# Patient Record
Sex: Female | Born: 1935
Health system: Southern US, Community
[De-identification: ages and names within clinical notes are randomized; demographics above are authoritative.]

## PROBLEM LIST (undated history)

## (undated) DIAGNOSIS — G20 Parkinson's disease: Secondary | ICD-10-CM

## (undated) DIAGNOSIS — K279 Peptic ulcer, site unspecified, unspecified as acute or chronic, without hemorrhage or perforation: Secondary | ICD-10-CM

## (undated) DIAGNOSIS — E785 Hyperlipidemia, unspecified: Secondary | ICD-10-CM

## (undated) DIAGNOSIS — I10 Essential (primary) hypertension: Secondary | ICD-10-CM

## (undated) DIAGNOSIS — E119 Type 2 diabetes mellitus without complications: Secondary | ICD-10-CM

## (undated) DIAGNOSIS — G20A1 Parkinson's disease without dyskinesia, without mention of fluctuations: Secondary | ICD-10-CM

## (undated) DIAGNOSIS — M1990 Unspecified osteoarthritis, unspecified site: Secondary | ICD-10-CM

## (undated) HISTORY — DX: Hyperlipidemia, unspecified: E78.5

## (undated) HISTORY — DX: Parkinson's disease without dyskinesia, without mention of fluctuations: G20.A1

## (undated) HISTORY — PX: CATARACT EXTRACTION, BILATERAL: SHX1313

## (undated) HISTORY — DX: Parkinson's disease: G20

## (undated) HISTORY — DX: Peptic ulcer, site unspecified, unspecified as acute or chronic, without hemorrhage or perforation: K27.9

## (undated) HISTORY — DX: Type 2 diabetes mellitus without complications: E11.9

## (undated) HISTORY — PX: ABDOMINAL HERNIA REPAIR: SHX539

## (undated) HISTORY — DX: Unspecified osteoarthritis, unspecified site: M19.90

## (undated) HISTORY — PX: OTHER SURGICAL HISTORY: SHX169

## (undated) HISTORY — DX: Essential (primary) hypertension: I10

---
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
## 1974-08-04 HISTORY — PX: VAGINAL HYSTERECTOMY: SUR661

## 1987-08-05 HISTORY — PX: CHOLECYSTECTOMY: SHX55

## 1991-08-05 HISTORY — PX: OTHER SURGICAL HISTORY: SHX169

## 1992-08-04 HISTORY — PX: ADENOIDECTOMY: SUR15

## 2005-06-09 ENCOUNTER — Ambulatory Visit: Payer: Self-pay | Admitting: Internal Medicine

## 2005-06-10 ENCOUNTER — Ambulatory Visit: Payer: Self-pay | Admitting: Internal Medicine

## 2010-03-23 ENCOUNTER — Inpatient Hospital Stay: Payer: Self-pay | Admitting: Internal Medicine

## 2010-03-29 LAB — PATHOLOGY REPORT

## 2010-04-18 ENCOUNTER — Ambulatory Visit: Payer: Self-pay | Admitting: Gastroenterology

## 2010-06-03 ENCOUNTER — Ambulatory Visit: Payer: Self-pay | Admitting: Gastroenterology

## 2010-07-04 ENCOUNTER — Ambulatory Visit: Payer: Self-pay | Admitting: Surgery

## 2010-07-11 ENCOUNTER — Inpatient Hospital Stay: Payer: Self-pay | Admitting: Surgery

## 2011-02-24 ENCOUNTER — Ambulatory Visit: Payer: Self-pay | Admitting: Gastroenterology

## 2011-07-29 ENCOUNTER — Emergency Department: Payer: Self-pay | Admitting: Internal Medicine

## 2012-04-27 ENCOUNTER — Observation Stay: Payer: Self-pay | Admitting: Internal Medicine

## 2012-04-27 LAB — CBC WITH DIFFERENTIAL/PLATELET
Basophil #: 0.1 10*3/uL (ref 0.0–0.1)
Basophil %: 0.6 %
Eosinophil #: 0 10*3/uL (ref 0.0–0.7)
HCT: 42.2 % (ref 35.0–47.0)
Lymphocyte #: 1.1 10*3/uL (ref 1.0–3.6)
MCHC: 33.3 g/dL (ref 32.0–36.0)
MCV: 88 fL (ref 80–100)
Monocyte #: 0.6 x10 3/mm (ref 0.2–0.9)
Neutrophil #: 8.6 10*3/uL — ABNORMAL HIGH (ref 1.4–6.5)
Neutrophil %: 82.6 %
RBC: 4.79 10*6/uL (ref 3.80–5.20)
RDW: 13.1 % (ref 11.5–14.5)
WBC: 10.4 10*3/uL (ref 3.6–11.0)

## 2012-04-27 LAB — URINALYSIS, COMPLETE
Blood: NEGATIVE
Protein: NEGATIVE
Specific Gravity: 1.005 (ref 1.003–1.030)

## 2012-04-27 LAB — COMPREHENSIVE METABOLIC PANEL
Albumin: 4.1 g/dL (ref 3.4–5.0)
Alkaline Phosphatase: 128 U/L (ref 50–136)
BUN: 16 mg/dL (ref 7–18)
Bilirubin,Total: 0.3 mg/dL (ref 0.2–1.0)
Chloride: 104 mmol/L (ref 98–107)
Creatinine: 0.66 mg/dL (ref 0.60–1.30)
EGFR (Non-African Amer.): >60
Glucose: 135 mg/dL — ABNORMAL HIGH (ref 65–99)
Osmolality: 283 (ref 275–301)
SGPT (ALT): 11 U/L — ABNORMAL LOW (ref 12–78)
Total Protein: 7.8 g/dL (ref 6.4–8.2)

## 2012-04-27 LAB — CK TOTAL AND CKMB (NOT AT ARMC)
CK, Total: 57 U/L (ref 21–215)
CK, Total: 40 U/L (ref 21–215)
CK-MB: <0.5 ng/mL — ABNORMAL LOW (ref 0.5–3.6)

## 2012-04-27 LAB — TROPONIN I
Troponin-I: <0.02 ng/mL
Troponin-I: 0.03 ng/mL

## 2012-04-28 LAB — CBC WITH DIFFERENTIAL/PLATELET
Basophil #: 0.1 10*3/uL (ref 0.0–0.1)
Basophil %: 0.9 %
Eosinophil #: 0.1 10*3/uL (ref 0.0–0.7)
HCT: 37.2 % (ref 35.0–47.0)
HGB: 12.7 g/dL (ref 12.0–16.0)
Lymphocyte #: 1.7 10*3/uL (ref 1.0–3.6)
MCH: 30.5 pg (ref 26.0–34.0)
MCV: 90 fL (ref 80–100)
Monocyte #: 0.4 x10 3/mm (ref 0.2–0.9)
Monocyte %: 6.1 %
Neutrophil #: 5 10*3/uL (ref 1.4–6.5)
Platelet: 230 10*3/uL (ref 150–440)
RBC: 4.15 10*6/uL (ref 3.80–5.20)
RDW: 13.2 % (ref 11.5–14.5)
WBC: 7.3 10*3/uL (ref 3.6–11.0)

## 2012-04-28 LAB — CK TOTAL AND CKMB (NOT AT ARMC)
CK, Total: 45 U/L (ref 21–215)
CK, Total: 45 U/L (ref 21–215)
CK-MB: 0.7 ng/mL (ref 0.5–3.6)
CK-MB: 0.8 ng/mL (ref 0.5–3.6)

## 2012-04-28 LAB — BASIC METABOLIC PANEL
Anion Gap: 13 (ref 7–16)
Calcium, Total: 8.5 mg/dL (ref 8.5–10.1)
Chloride: 107 mmol/L (ref 98–107)
Co2: 21 mmol/L (ref 21–32)
Creatinine: 0.58 mg/dL — ABNORMAL LOW (ref 0.60–1.30)
EGFR (African American): >60
EGFR (Non-African Amer.): >60

## 2012-04-28 LAB — TROPONIN I: Troponin-I: <0.02 ng/mL

## 2012-12-03 ENCOUNTER — Emergency Department: Payer: Self-pay | Admitting: Emergency Medicine

## 2012-12-03 LAB — CBC
HCT: 42.8 % (ref 35.0–47.0)
HGB: 14.6 g/dL (ref 12.0–16.0)
MCH: 29.5 pg (ref 26.0–34.0)
MCHC: 34.1 g/dL (ref 32.0–36.0)
Platelet: 240 10*3/uL (ref 150–440)
RBC: 4.94 10*6/uL (ref 3.80–5.20)
WBC: 8 10*3/uL (ref 3.6–11.0)

## 2012-12-03 LAB — COMPREHENSIVE METABOLIC PANEL
Alkaline Phosphatase: 140 U/L — ABNORMAL HIGH (ref 50–136)
Anion Gap: 6 — ABNORMAL LOW (ref 7–16)
Chloride: 103 mmol/L (ref 98–107)
EGFR (African American): >60
EGFR (Non-African Amer.): >60
SGPT (ALT): 9 U/L — ABNORMAL LOW (ref 12–78)
Sodium: 136 mmol/L (ref 136–145)

## 2012-12-08 ENCOUNTER — Emergency Department: Payer: Self-pay

## 2012-12-08 LAB — COMPREHENSIVE METABOLIC PANEL
Alkaline Phosphatase: 132 U/L (ref 50–136)
BUN: 20 mg/dL — ABNORMAL HIGH (ref 7–18)
Bilirubin,Total: 0.3 mg/dL (ref 0.2–1.0)
Calcium, Total: 9.2 mg/dL (ref 8.5–10.1)
Creatinine: 0.42 mg/dL — ABNORMAL LOW (ref 0.60–1.30)
EGFR (African American): >60
Glucose: 106 mg/dL — ABNORMAL HIGH (ref 65–99)
Potassium: 4.6 mmol/L (ref 3.5–5.1)
SGOT(AST): 20 U/L (ref 15–37)

## 2012-12-08 LAB — URINALYSIS, COMPLETE
Blood: NEGATIVE
Ketone: NEGATIVE
Ph: 7 (ref 4.5–8.0)
Protein: NEGATIVE
Specific Gravity: 1.003 (ref 1.003–1.030)
Squamous Epithelial: <1

## 2012-12-08 LAB — CK TOTAL AND CKMB (NOT AT ARMC): CK-MB: <0.5 ng/mL — ABNORMAL LOW (ref 0.5–3.6)

## 2012-12-08 LAB — CBC
HGB: 13 g/dL (ref 12.0–16.0)
MCV: 88 fL (ref 80–100)
RDW: 13 % (ref 11.5–14.5)

## 2012-12-08 LAB — TROPONIN I: Troponin-I: <0.02 ng/mL

## 2012-12-09 ENCOUNTER — Ambulatory Visit: Payer: Self-pay | Admitting: Gastroenterology

## 2013-08-08 DIAGNOSIS — D729 Disorder of white blood cells, unspecified: Secondary | ICD-10-CM | POA: Diagnosis not present

## 2013-08-08 DIAGNOSIS — D518 Other vitamin B12 deficiency anemias: Secondary | ICD-10-CM | POA: Diagnosis not present

## 2013-08-08 DIAGNOSIS — G259 Extrapyramidal and movement disorder, unspecified: Secondary | ICD-10-CM | POA: Diagnosis not present

## 2013-08-08 DIAGNOSIS — E039 Hypothyroidism, unspecified: Secondary | ICD-10-CM | POA: Diagnosis not present

## 2013-08-08 DIAGNOSIS — E559 Vitamin D deficiency, unspecified: Secondary | ICD-10-CM | POA: Diagnosis not present

## 2013-08-31 ENCOUNTER — Emergency Department: Payer: Self-pay | Admitting: Emergency Medicine

## 2013-08-31 DIAGNOSIS — Z888 Allergy status to other drugs, medicaments and biological substances status: Secondary | ICD-10-CM | POA: Diagnosis not present

## 2013-08-31 DIAGNOSIS — Z9079 Acquired absence of other genital organ(s): Secondary | ICD-10-CM | POA: Diagnosis not present

## 2013-08-31 DIAGNOSIS — G20 Parkinson's disease: Secondary | ICD-10-CM | POA: Diagnosis not present

## 2013-08-31 DIAGNOSIS — R1084 Generalized abdominal pain: Secondary | ICD-10-CM | POA: Diagnosis not present

## 2013-08-31 DIAGNOSIS — G20A1 Parkinson's disease without dyskinesia, without mention of fluctuations: Secondary | ICD-10-CM | POA: Diagnosis not present

## 2013-08-31 DIAGNOSIS — R51 Headache: Secondary | ICD-10-CM | POA: Diagnosis not present

## 2013-08-31 DIAGNOSIS — I10 Essential (primary) hypertension: Secondary | ICD-10-CM | POA: Diagnosis not present

## 2013-08-31 DIAGNOSIS — Z9089 Acquired absence of other organs: Secondary | ICD-10-CM | POA: Diagnosis not present

## 2013-08-31 DIAGNOSIS — Z79899 Other long term (current) drug therapy: Secondary | ICD-10-CM | POA: Diagnosis not present

## 2013-08-31 DIAGNOSIS — R5381 Other malaise: Secondary | ICD-10-CM | POA: Diagnosis not present

## 2013-08-31 DIAGNOSIS — R5383 Other fatigue: Secondary | ICD-10-CM | POA: Diagnosis not present

## 2013-08-31 LAB — CBC WITH DIFFERENTIAL/PLATELET
BASOS PCT: 0.5 %
Basophil #: 0 10*3/uL (ref 0.0–0.1)
EOS ABS: 0 10*3/uL (ref 0.0–0.7)
EOS PCT: 0.5 %
HCT: 41.4 % (ref 35.0–47.0)
HGB: 13.8 g/dL (ref 12.0–16.0)
LYMPHS PCT: 18.7 %
Lymphocyte #: 1.7 10*3/uL (ref 1.0–3.6)
MCH: 29.8 pg (ref 26.0–34.0)
MCHC: 33.4 g/dL (ref 32.0–36.0)
MCV: 89 fL (ref 80–100)
MONOS PCT: 6.2 %
Monocyte #: 0.6 x10 3/mm (ref 0.2–0.9)
Neutrophil #: 6.7 10*3/uL — ABNORMAL HIGH (ref 1.4–6.5)
Neutrophil %: 74.1 %
Platelet: 257 10*3/uL (ref 150–440)
RBC: 4.64 10*6/uL (ref 3.80–5.20)
RDW: 13.7 % (ref 11.5–14.5)
WBC: 9 10*3/uL (ref 3.6–11.0)

## 2013-08-31 LAB — COMPREHENSIVE METABOLIC PANEL
ALT: 11 U/L — AB (ref 12–78)
Albumin: 4.2 g/dL (ref 3.4–5.0)
Alkaline Phosphatase: 122 U/L — ABNORMAL HIGH
Anion Gap: 6 — ABNORMAL LOW (ref 7–16)
BILIRUBIN TOTAL: 0.5 mg/dL (ref 0.2–1.0)
BUN: 15 mg/dL (ref 7–18)
Calcium, Total: 9.5 mg/dL (ref 8.5–10.1)
Chloride: 102 mmol/L (ref 98–107)
Co2: 29 mmol/L (ref 21–32)
Creatinine: 0.62 mg/dL (ref 0.60–1.30)
EGFR (Non-African Amer.): >60
Glucose: 121 mg/dL — ABNORMAL HIGH (ref 65–99)
Osmolality: 276 (ref 275–301)
POTASSIUM: 3.8 mmol/L (ref 3.5–5.1)
SGOT(AST): 19 U/L (ref 15–37)
SODIUM: 137 mmol/L (ref 136–145)
TOTAL PROTEIN: 7.9 g/dL (ref 6.4–8.2)

## 2013-08-31 LAB — URINALYSIS, COMPLETE
BLOOD: NEGATIVE
Bilirubin,UR: NEGATIVE
Glucose,UR: NEGATIVE mg/dL (ref 0–75)
Ketone: NEGATIVE
NITRITE: NEGATIVE
PROTEIN: NEGATIVE
Ph: 7 (ref 4.5–8.0)
RBC,UR: <1 /HPF (ref 0–5)
Specific Gravity: 1.003 (ref 1.003–1.030)
Squamous Epithelial: 1
WBC UR: 3 /HPF (ref 0–5)

## 2013-08-31 LAB — TROPONIN I: Troponin-I: <0.02 ng/mL

## 2013-09-02 DIAGNOSIS — W19XXXA Unspecified fall, initial encounter: Secondary | ICD-10-CM | POA: Diagnosis not present

## 2013-09-02 DIAGNOSIS — G20 Parkinson's disease: Secondary | ICD-10-CM | POA: Diagnosis not present

## 2013-09-02 DIAGNOSIS — G4700 Insomnia, unspecified: Secondary | ICD-10-CM | POA: Diagnosis not present

## 2013-09-02 DIAGNOSIS — F411 Generalized anxiety disorder: Secondary | ICD-10-CM | POA: Diagnosis not present

## 2013-09-02 DIAGNOSIS — K5909 Other constipation: Secondary | ICD-10-CM | POA: Diagnosis not present

## 2013-09-23 DIAGNOSIS — G2401 Drug induced subacute dyskinesia: Secondary | ICD-10-CM | POA: Diagnosis not present

## 2013-09-23 DIAGNOSIS — R269 Unspecified abnormalities of gait and mobility: Secondary | ICD-10-CM | POA: Diagnosis not present

## 2013-09-23 DIAGNOSIS — F411 Generalized anxiety disorder: Secondary | ICD-10-CM | POA: Diagnosis not present

## 2013-09-23 DIAGNOSIS — M48061 Spinal stenosis, lumbar region without neurogenic claudication: Secondary | ICD-10-CM | POA: Diagnosis not present

## 2013-09-23 DIAGNOSIS — M25569 Pain in unspecified knee: Secondary | ICD-10-CM | POA: Diagnosis not present

## 2013-09-23 DIAGNOSIS — IMO0001 Reserved for inherently not codable concepts without codable children: Secondary | ICD-10-CM | POA: Diagnosis not present

## 2013-09-23 DIAGNOSIS — T44905A Adverse effect of unspecified drugs primarily affecting the autonomic nervous system, initial encounter: Secondary | ICD-10-CM | POA: Diagnosis not present

## 2013-09-23 DIAGNOSIS — G20 Parkinson's disease: Secondary | ICD-10-CM | POA: Diagnosis not present

## 2013-09-23 DIAGNOSIS — Z9181 History of falling: Secondary | ICD-10-CM | POA: Diagnosis not present

## 2013-09-27 DIAGNOSIS — M48061 Spinal stenosis, lumbar region without neurogenic claudication: Secondary | ICD-10-CM | POA: Diagnosis not present

## 2013-09-27 DIAGNOSIS — R269 Unspecified abnormalities of gait and mobility: Secondary | ICD-10-CM | POA: Diagnosis not present

## 2013-09-27 DIAGNOSIS — F411 Generalized anxiety disorder: Secondary | ICD-10-CM | POA: Diagnosis not present

## 2013-09-27 DIAGNOSIS — IMO0001 Reserved for inherently not codable concepts without codable children: Secondary | ICD-10-CM | POA: Diagnosis not present

## 2013-09-27 DIAGNOSIS — G20 Parkinson's disease: Secondary | ICD-10-CM | POA: Diagnosis not present

## 2013-09-27 DIAGNOSIS — M25569 Pain in unspecified knee: Secondary | ICD-10-CM | POA: Diagnosis not present

## 2013-09-28 DIAGNOSIS — F4320 Adjustment disorder, unspecified: Secondary | ICD-10-CM | POA: Diagnosis not present

## 2013-09-30 DIAGNOSIS — G20 Parkinson's disease: Secondary | ICD-10-CM | POA: Diagnosis not present

## 2013-09-30 DIAGNOSIS — IMO0001 Reserved for inherently not codable concepts without codable children: Secondary | ICD-10-CM | POA: Diagnosis not present

## 2013-09-30 DIAGNOSIS — R269 Unspecified abnormalities of gait and mobility: Secondary | ICD-10-CM | POA: Diagnosis not present

## 2013-09-30 DIAGNOSIS — M48061 Spinal stenosis, lumbar region without neurogenic claudication: Secondary | ICD-10-CM | POA: Diagnosis not present

## 2013-09-30 DIAGNOSIS — M25569 Pain in unspecified knee: Secondary | ICD-10-CM | POA: Diagnosis not present

## 2013-09-30 DIAGNOSIS — F411 Generalized anxiety disorder: Secondary | ICD-10-CM | POA: Diagnosis not present

## 2013-10-03 DIAGNOSIS — G20 Parkinson's disease: Secondary | ICD-10-CM | POA: Diagnosis not present

## 2013-10-03 DIAGNOSIS — R269 Unspecified abnormalities of gait and mobility: Secondary | ICD-10-CM | POA: Diagnosis not present

## 2013-10-03 DIAGNOSIS — M25569 Pain in unspecified knee: Secondary | ICD-10-CM | POA: Diagnosis not present

## 2013-10-03 DIAGNOSIS — IMO0001 Reserved for inherently not codable concepts without codable children: Secondary | ICD-10-CM | POA: Diagnosis not present

## 2013-10-03 DIAGNOSIS — F411 Generalized anxiety disorder: Secondary | ICD-10-CM | POA: Diagnosis not present

## 2013-10-03 DIAGNOSIS — M48061 Spinal stenosis, lumbar region without neurogenic claudication: Secondary | ICD-10-CM | POA: Diagnosis not present

## 2013-10-04 DIAGNOSIS — G20 Parkinson's disease: Secondary | ICD-10-CM | POA: Diagnosis not present

## 2013-10-04 DIAGNOSIS — M48061 Spinal stenosis, lumbar region without neurogenic claudication: Secondary | ICD-10-CM | POA: Diagnosis not present

## 2013-10-04 DIAGNOSIS — F411 Generalized anxiety disorder: Secondary | ICD-10-CM | POA: Diagnosis not present

## 2013-10-04 DIAGNOSIS — G2401 Drug induced subacute dyskinesia: Secondary | ICD-10-CM | POA: Diagnosis not present

## 2013-10-04 DIAGNOSIS — R269 Unspecified abnormalities of gait and mobility: Secondary | ICD-10-CM | POA: Diagnosis not present

## 2013-10-04 DIAGNOSIS — M25569 Pain in unspecified knee: Secondary | ICD-10-CM | POA: Diagnosis not present

## 2013-10-08 DIAGNOSIS — G20 Parkinson's disease: Secondary | ICD-10-CM | POA: Diagnosis not present

## 2013-10-08 DIAGNOSIS — M48061 Spinal stenosis, lumbar region without neurogenic claudication: Secondary | ICD-10-CM | POA: Diagnosis not present

## 2013-10-08 DIAGNOSIS — F411 Generalized anxiety disorder: Secondary | ICD-10-CM | POA: Diagnosis not present

## 2013-10-08 DIAGNOSIS — M25569 Pain in unspecified knee: Secondary | ICD-10-CM | POA: Diagnosis not present

## 2013-10-08 DIAGNOSIS — R269 Unspecified abnormalities of gait and mobility: Secondary | ICD-10-CM | POA: Diagnosis not present

## 2013-10-08 DIAGNOSIS — IMO0001 Reserved for inherently not codable concepts without codable children: Secondary | ICD-10-CM | POA: Diagnosis not present

## 2013-10-11 DIAGNOSIS — G20 Parkinson's disease: Secondary | ICD-10-CM | POA: Diagnosis not present

## 2013-10-11 DIAGNOSIS — R269 Unspecified abnormalities of gait and mobility: Secondary | ICD-10-CM | POA: Diagnosis not present

## 2013-10-11 DIAGNOSIS — M48061 Spinal stenosis, lumbar region without neurogenic claudication: Secondary | ICD-10-CM | POA: Diagnosis not present

## 2013-10-11 DIAGNOSIS — F411 Generalized anxiety disorder: Secondary | ICD-10-CM | POA: Diagnosis not present

## 2013-10-11 DIAGNOSIS — IMO0001 Reserved for inherently not codable concepts without codable children: Secondary | ICD-10-CM | POA: Diagnosis not present

## 2013-10-11 DIAGNOSIS — M25569 Pain in unspecified knee: Secondary | ICD-10-CM | POA: Diagnosis not present

## 2013-10-13 DIAGNOSIS — M48061 Spinal stenosis, lumbar region without neurogenic claudication: Secondary | ICD-10-CM | POA: Diagnosis not present

## 2013-10-13 DIAGNOSIS — IMO0001 Reserved for inherently not codable concepts without codable children: Secondary | ICD-10-CM | POA: Diagnosis not present

## 2013-10-13 DIAGNOSIS — R269 Unspecified abnormalities of gait and mobility: Secondary | ICD-10-CM | POA: Diagnosis not present

## 2013-10-13 DIAGNOSIS — F411 Generalized anxiety disorder: Secondary | ICD-10-CM | POA: Diagnosis not present

## 2013-10-13 DIAGNOSIS — G20 Parkinson's disease: Secondary | ICD-10-CM | POA: Diagnosis not present

## 2013-10-13 DIAGNOSIS — M25569 Pain in unspecified knee: Secondary | ICD-10-CM | POA: Diagnosis not present

## 2013-10-18 DIAGNOSIS — IMO0001 Reserved for inherently not codable concepts without codable children: Secondary | ICD-10-CM | POA: Diagnosis not present

## 2013-10-18 DIAGNOSIS — G20 Parkinson's disease: Secondary | ICD-10-CM | POA: Diagnosis not present

## 2013-10-18 DIAGNOSIS — F411 Generalized anxiety disorder: Secondary | ICD-10-CM | POA: Diagnosis not present

## 2013-10-18 DIAGNOSIS — R269 Unspecified abnormalities of gait and mobility: Secondary | ICD-10-CM | POA: Diagnosis not present

## 2013-10-18 DIAGNOSIS — M25569 Pain in unspecified knee: Secondary | ICD-10-CM | POA: Diagnosis not present

## 2013-10-18 DIAGNOSIS — M48061 Spinal stenosis, lumbar region without neurogenic claudication: Secondary | ICD-10-CM | POA: Diagnosis not present

## 2013-10-20 DIAGNOSIS — M25569 Pain in unspecified knee: Secondary | ICD-10-CM | POA: Diagnosis not present

## 2013-10-20 DIAGNOSIS — G20 Parkinson's disease: Secondary | ICD-10-CM | POA: Diagnosis not present

## 2013-10-20 DIAGNOSIS — IMO0001 Reserved for inherently not codable concepts without codable children: Secondary | ICD-10-CM | POA: Diagnosis not present

## 2013-10-20 DIAGNOSIS — R269 Unspecified abnormalities of gait and mobility: Secondary | ICD-10-CM | POA: Diagnosis not present

## 2013-10-20 DIAGNOSIS — F411 Generalized anxiety disorder: Secondary | ICD-10-CM | POA: Diagnosis not present

## 2013-10-20 DIAGNOSIS — M48061 Spinal stenosis, lumbar region without neurogenic claudication: Secondary | ICD-10-CM | POA: Diagnosis not present

## 2013-11-01 DIAGNOSIS — I10 Essential (primary) hypertension: Secondary | ICD-10-CM | POA: Diagnosis not present

## 2013-11-01 DIAGNOSIS — M6281 Muscle weakness (generalized): Secondary | ICD-10-CM | POA: Diagnosis not present

## 2013-11-01 DIAGNOSIS — D518 Other vitamin B12 deficiency anemias: Secondary | ICD-10-CM | POA: Diagnosis not present

## 2013-11-01 DIAGNOSIS — K219 Gastro-esophageal reflux disease without esophagitis: Secondary | ICD-10-CM | POA: Diagnosis not present

## 2013-11-01 DIAGNOSIS — M810 Age-related osteoporosis without current pathological fracture: Secondary | ICD-10-CM | POA: Diagnosis not present

## 2013-11-01 DIAGNOSIS — G259 Extrapyramidal and movement disorder, unspecified: Secondary | ICD-10-CM | POA: Diagnosis not present

## 2013-11-01 DIAGNOSIS — H9190 Unspecified hearing loss, unspecified ear: Secondary | ICD-10-CM | POA: Diagnosis not present

## 2013-11-01 DIAGNOSIS — D649 Anemia, unspecified: Secondary | ICD-10-CM | POA: Diagnosis not present

## 2013-11-03 DIAGNOSIS — F411 Generalized anxiety disorder: Secondary | ICD-10-CM | POA: Diagnosis not present

## 2013-12-01 DIAGNOSIS — F411 Generalized anxiety disorder: Secondary | ICD-10-CM | POA: Diagnosis not present

## 2013-12-01 DIAGNOSIS — K5909 Other constipation: Secondary | ICD-10-CM | POA: Diagnosis not present

## 2013-12-01 DIAGNOSIS — G4700 Insomnia, unspecified: Secondary | ICD-10-CM | POA: Diagnosis not present

## 2013-12-01 DIAGNOSIS — G20 Parkinson's disease: Secondary | ICD-10-CM | POA: Diagnosis not present

## 2013-12-01 DIAGNOSIS — W19XXXA Unspecified fall, initial encounter: Secondary | ICD-10-CM | POA: Diagnosis not present

## 2014-02-17 DIAGNOSIS — W19XXXA Unspecified fall, initial encounter: Secondary | ICD-10-CM | POA: Diagnosis not present

## 2014-02-17 DIAGNOSIS — G20 Parkinson's disease: Secondary | ICD-10-CM | POA: Diagnosis not present

## 2014-02-17 DIAGNOSIS — G4700 Insomnia, unspecified: Secondary | ICD-10-CM | POA: Diagnosis not present

## 2014-02-17 DIAGNOSIS — F411 Generalized anxiety disorder: Secondary | ICD-10-CM | POA: Diagnosis not present

## 2014-03-01 DIAGNOSIS — M25569 Pain in unspecified knee: Secondary | ICD-10-CM | POA: Diagnosis not present

## 2014-03-01 DIAGNOSIS — E559 Vitamin D deficiency, unspecified: Secondary | ICD-10-CM | POA: Diagnosis not present

## 2014-03-01 DIAGNOSIS — G238 Other specified degenerative diseases of basal ganglia: Secondary | ICD-10-CM | POA: Diagnosis not present

## 2014-03-01 DIAGNOSIS — D518 Other vitamin B12 deficiency anemias: Secondary | ICD-10-CM | POA: Diagnosis not present

## 2014-03-01 DIAGNOSIS — R5381 Other malaise: Secondary | ICD-10-CM | POA: Diagnosis not present

## 2014-03-01 DIAGNOSIS — R5383 Other fatigue: Secondary | ICD-10-CM | POA: Diagnosis not present

## 2014-03-01 DIAGNOSIS — I10 Essential (primary) hypertension: Secondary | ICD-10-CM | POA: Diagnosis not present

## 2014-03-01 DIAGNOSIS — D649 Anemia, unspecified: Secondary | ICD-10-CM | POA: Diagnosis not present

## 2014-03-22 DIAGNOSIS — E2839 Other primary ovarian failure: Secondary | ICD-10-CM | POA: Diagnosis not present

## 2014-03-22 DIAGNOSIS — M899 Disorder of bone, unspecified: Secondary | ICD-10-CM | POA: Diagnosis not present

## 2014-03-22 DIAGNOSIS — Z13820 Encounter for screening for osteoporosis: Secondary | ICD-10-CM | POA: Diagnosis not present

## 2014-03-22 DIAGNOSIS — M810 Age-related osteoporosis without current pathological fracture: Secondary | ICD-10-CM | POA: Diagnosis not present

## 2014-03-22 DIAGNOSIS — M949 Disorder of cartilage, unspecified: Secondary | ICD-10-CM | POA: Diagnosis not present

## 2014-03-31 ENCOUNTER — Emergency Department: Payer: Self-pay | Admitting: Internal Medicine

## 2014-03-31 DIAGNOSIS — S0003XA Contusion of scalp, initial encounter: Secondary | ICD-10-CM | POA: Diagnosis not present

## 2014-03-31 DIAGNOSIS — S0083XA Contusion of other part of head, initial encounter: Secondary | ICD-10-CM | POA: Diagnosis not present

## 2014-03-31 DIAGNOSIS — S79919A Unspecified injury of unspecified hip, initial encounter: Secondary | ICD-10-CM | POA: Diagnosis not present

## 2014-03-31 DIAGNOSIS — M25559 Pain in unspecified hip: Secondary | ICD-10-CM | POA: Diagnosis not present

## 2014-03-31 DIAGNOSIS — G20 Parkinson's disease: Secondary | ICD-10-CM | POA: Diagnosis not present

## 2014-03-31 DIAGNOSIS — S7000XA Contusion of unspecified hip, initial encounter: Secondary | ICD-10-CM | POA: Diagnosis not present

## 2014-03-31 DIAGNOSIS — I10 Essential (primary) hypertension: Secondary | ICD-10-CM | POA: Diagnosis not present

## 2014-03-31 DIAGNOSIS — S79929A Unspecified injury of unspecified thigh, initial encounter: Secondary | ICD-10-CM | POA: Diagnosis not present

## 2014-05-12 DIAGNOSIS — M810 Age-related osteoporosis without current pathological fracture: Secondary | ICD-10-CM | POA: Diagnosis not present

## 2014-05-18 DIAGNOSIS — H2512 Age-related nuclear cataract, left eye: Secondary | ICD-10-CM | POA: Diagnosis not present

## 2014-05-22 DIAGNOSIS — G20 Parkinson's disease: Secondary | ICD-10-CM | POA: Diagnosis not present

## 2014-05-22 DIAGNOSIS — F419 Anxiety disorder, unspecified: Secondary | ICD-10-CM | POA: Diagnosis not present

## 2014-05-22 DIAGNOSIS — K5909 Other constipation: Secondary | ICD-10-CM | POA: Diagnosis not present

## 2014-05-22 DIAGNOSIS — W19XXXA Unspecified fall, initial encounter: Secondary | ICD-10-CM | POA: Diagnosis not present

## 2014-06-05 DIAGNOSIS — E559 Vitamin D deficiency, unspecified: Secondary | ICD-10-CM | POA: Diagnosis not present

## 2014-06-05 DIAGNOSIS — G259 Extrapyramidal and movement disorder, unspecified: Secondary | ICD-10-CM | POA: Diagnosis not present

## 2014-06-05 DIAGNOSIS — M810 Age-related osteoporosis without current pathological fracture: Secondary | ICD-10-CM | POA: Diagnosis not present

## 2014-06-05 DIAGNOSIS — I10 Essential (primary) hypertension: Secondary | ICD-10-CM | POA: Diagnosis not present

## 2014-06-05 DIAGNOSIS — D518 Other vitamin B12 deficiency anemias: Secondary | ICD-10-CM | POA: Diagnosis not present

## 2014-06-05 DIAGNOSIS — G20 Parkinson's disease: Secondary | ICD-10-CM | POA: Diagnosis not present

## 2014-06-05 DIAGNOSIS — Z8711 Personal history of peptic ulcer disease: Secondary | ICD-10-CM | POA: Diagnosis not present

## 2014-06-06 DIAGNOSIS — H905 Unspecified sensorineural hearing loss: Secondary | ICD-10-CM | POA: Diagnosis not present

## 2014-06-06 DIAGNOSIS — H6123 Impacted cerumen, bilateral: Secondary | ICD-10-CM | POA: Diagnosis not present

## 2014-06-06 DIAGNOSIS — G20 Parkinson's disease: Secondary | ICD-10-CM | POA: Diagnosis not present

## 2014-06-12 DIAGNOSIS — M810 Age-related osteoporosis without current pathological fracture: Secondary | ICD-10-CM | POA: Diagnosis not present

## 2014-07-05 ENCOUNTER — Ambulatory Visit: Payer: Self-pay | Admitting: Ophthalmology

## 2014-07-05 DIAGNOSIS — Z01810 Encounter for preprocedural cardiovascular examination: Secondary | ICD-10-CM | POA: Diagnosis not present

## 2014-07-05 DIAGNOSIS — H2512 Age-related nuclear cataract, left eye: Secondary | ICD-10-CM | POA: Diagnosis not present

## 2014-07-05 DIAGNOSIS — H269 Unspecified cataract: Secondary | ICD-10-CM | POA: Diagnosis not present

## 2014-07-05 DIAGNOSIS — Z01812 Encounter for preprocedural laboratory examination: Secondary | ICD-10-CM | POA: Diagnosis not present

## 2014-07-05 DIAGNOSIS — I10 Essential (primary) hypertension: Secondary | ICD-10-CM | POA: Diagnosis not present

## 2014-07-05 LAB — HEMOGLOBIN: HGB: 12.8 g/dL (ref 12.0–16.0)

## 2014-07-18 ENCOUNTER — Ambulatory Visit: Payer: Self-pay | Admitting: Ophthalmology

## 2014-07-18 DIAGNOSIS — K219 Gastro-esophageal reflux disease without esophagitis: Secondary | ICD-10-CM | POA: Diagnosis not present

## 2014-07-18 DIAGNOSIS — Z888 Allergy status to other drugs, medicaments and biological substances status: Secondary | ICD-10-CM | POA: Diagnosis not present

## 2014-07-18 DIAGNOSIS — K449 Diaphragmatic hernia without obstruction or gangrene: Secondary | ICD-10-CM | POA: Diagnosis not present

## 2014-07-18 DIAGNOSIS — Z79899 Other long term (current) drug therapy: Secondary | ICD-10-CM | POA: Diagnosis not present

## 2014-07-18 DIAGNOSIS — Z885 Allergy status to narcotic agent status: Secondary | ICD-10-CM | POA: Diagnosis not present

## 2014-07-18 DIAGNOSIS — I10 Essential (primary) hypertension: Secondary | ICD-10-CM | POA: Diagnosis not present

## 2014-07-18 DIAGNOSIS — D649 Anemia, unspecified: Secondary | ICD-10-CM | POA: Diagnosis not present

## 2014-07-18 DIAGNOSIS — H2512 Age-related nuclear cataract, left eye: Secondary | ICD-10-CM | POA: Diagnosis not present

## 2014-07-18 DIAGNOSIS — H9190 Unspecified hearing loss, unspecified ear: Secondary | ICD-10-CM | POA: Diagnosis not present

## 2014-07-18 DIAGNOSIS — M1990 Unspecified osteoarthritis, unspecified site: Secondary | ICD-10-CM | POA: Diagnosis not present

## 2014-07-18 DIAGNOSIS — Z886 Allergy status to analgesic agent status: Secondary | ICD-10-CM | POA: Diagnosis not present

## 2014-07-18 DIAGNOSIS — G20 Parkinson's disease: Secondary | ICD-10-CM | POA: Diagnosis not present

## 2014-08-21 DIAGNOSIS — F419 Anxiety disorder, unspecified: Secondary | ICD-10-CM | POA: Diagnosis not present

## 2014-08-21 DIAGNOSIS — G20 Parkinson's disease: Secondary | ICD-10-CM | POA: Diagnosis not present

## 2014-08-21 DIAGNOSIS — W19XXXA Unspecified fall, initial encounter: Secondary | ICD-10-CM | POA: Diagnosis not present

## 2014-08-21 DIAGNOSIS — K5909 Other constipation: Secondary | ICD-10-CM | POA: Diagnosis not present

## 2014-08-21 DIAGNOSIS — R202 Paresthesia of skin: Secondary | ICD-10-CM | POA: Diagnosis not present

## 2014-09-12 ENCOUNTER — Ambulatory Visit: Payer: Self-pay | Admitting: Ophthalmology

## 2014-09-12 DIAGNOSIS — I10 Essential (primary) hypertension: Secondary | ICD-10-CM | POA: Diagnosis not present

## 2014-09-12 DIAGNOSIS — H2511 Age-related nuclear cataract, right eye: Secondary | ICD-10-CM | POA: Diagnosis not present

## 2014-09-12 DIAGNOSIS — Z79899 Other long term (current) drug therapy: Secondary | ICD-10-CM | POA: Diagnosis not present

## 2014-09-12 DIAGNOSIS — Z01812 Encounter for preprocedural laboratory examination: Secondary | ICD-10-CM | POA: Diagnosis not present

## 2014-09-19 ENCOUNTER — Ambulatory Visit: Payer: Self-pay | Admitting: Ophthalmology

## 2014-09-19 DIAGNOSIS — K219 Gastro-esophageal reflux disease without esophagitis: Secondary | ICD-10-CM | POA: Diagnosis not present

## 2014-09-19 DIAGNOSIS — Z885 Allergy status to narcotic agent status: Secondary | ICD-10-CM | POA: Diagnosis not present

## 2014-09-19 DIAGNOSIS — H2511 Age-related nuclear cataract, right eye: Secondary | ICD-10-CM | POA: Diagnosis not present

## 2014-09-19 DIAGNOSIS — Z888 Allergy status to other drugs, medicaments and biological substances status: Secondary | ICD-10-CM | POA: Diagnosis not present

## 2014-09-19 DIAGNOSIS — I10 Essential (primary) hypertension: Secondary | ICD-10-CM | POA: Diagnosis not present

## 2014-09-19 DIAGNOSIS — D649 Anemia, unspecified: Secondary | ICD-10-CM | POA: Diagnosis not present

## 2014-11-07 DIAGNOSIS — G239 Degenerative disease of basal ganglia, unspecified: Secondary | ICD-10-CM | POA: Diagnosis not present

## 2014-11-07 DIAGNOSIS — Z0001 Encounter for general adult medical examination with abnormal findings: Secondary | ICD-10-CM | POA: Diagnosis not present

## 2014-11-07 DIAGNOSIS — K219 Gastro-esophageal reflux disease without esophagitis: Secondary | ICD-10-CM | POA: Diagnosis not present

## 2014-11-07 DIAGNOSIS — G259 Extrapyramidal and movement disorder, unspecified: Secondary | ICD-10-CM | POA: Diagnosis not present

## 2014-11-07 DIAGNOSIS — K648 Other hemorrhoids: Secondary | ICD-10-CM | POA: Diagnosis not present

## 2014-11-07 DIAGNOSIS — M810 Age-related osteoporosis without current pathological fracture: Secondary | ICD-10-CM | POA: Diagnosis not present

## 2014-11-07 DIAGNOSIS — I10 Essential (primary) hypertension: Secondary | ICD-10-CM | POA: Diagnosis not present

## 2014-11-07 DIAGNOSIS — G5601 Carpal tunnel syndrome, right upper limb: Secondary | ICD-10-CM | POA: Diagnosis not present

## 2014-11-21 NOTE — H&P (Signed)
PATIENT NAME:  Jennifer Snyder, Jennifer Snyder MR#:  [id number] DATE OF BIRTH:  10/04/1935  DATE OF ADMISSION:  04/27/2012  PRIMARY CARE PHYSICIAN: Dr. Beverely RisenFozia Snyder.   History obtained from the patient, her daughter at bedside. Case was discussed with the ER physician, Dr. Silverio Snyder. Old records, EKG and imaging studies personally reviewed.   CHIEF COMPLAINT: Weakness.   HISTORY OF PRESENTING ILLNESS: A 79 year old Caucasian female patient with history of Parkinson's disease, hypertension, diverticulosis, and osteoarthritis who at baseline walks with a walker comes in with complaints of three days of progressive weakness. The weakness was to the point that the patient could not walk earlier today and was brought into the Emergency Room. She does not complain of any generalized pain, but feels like she has flu which is taking her down. No cough, fever, chills, or burning urination. She mentions that she has not been able to eat or drink much and has decreased urination and no dark urine. No hematuria.   She has received 1.5 L NS and still tachycardic 120s. Some hypoxia into 80s at times.  The patient was recently treated for a tooth abscess with amoxicillin and azithromycin, which has resolved. No diarrhea.   PAST MEDICAL HISTORY:  1. Gastric ulcers.  2. Parkinson's disease.  3. Esophageal dysmotility disorder.  4. Diverticulosis. 5. Paraesophageal hernia. 6. Hypertension. 7. Osteoarthritis. 8. Dyslipidemia, intolerant to statins. 9. Cataracts.   ALLERGIES: Codeine.   FAMILY HISTORY: Heart disease. Father died of myocardial infarction. He also had lung cancer. Mother had Parkinson's disease.   SOCIAL HISTORY: The patient lives at home with her daughter. She does not smoke. No alcohol. No illicit drugs. Walks with a walker at baseline.   Snyder STATUS: DNR/DNI.   REVIEW OF SYSTEMS: CONSTITUTIONAL: No fever, chills, weight loss, weight gain. EYES: No inflammation, glaucoma, has history of cataracts. ENT: No ear  pain. Had some decreased hearing. No epistaxis, discharge or congestion. RESPIRATORY: No cough, wheezing, or hemoptysis, has been mildly hypoxic in the Emergency Room into the high 80s at times. CARDIOVASCULAR: No chest pain, orthopnea, has tachycardia. GASTROINTESTINAL: No nausea or vomiting. GENITOURINARY: Decreased urination. No dark urine, hematuria, or dysuria. ENDOCRINE: No heat intolerance or cold intolerance. HEMATOLOGIC: No anemia, easy bruising, or bleeding. SKIN: No rash or ulcers. MUSCULOSKELETAL: Has osteoarthritis on and off. NEUROLOGIC: Has generalized weakness. No focal numbness or weakness. PSYCHIATRIC: No depression or anxiety.   PHYSICAL EXAMINATION:  VITAL SIGNS: Temperature 97.1, pulse 125, respirations 18, blood pressure 183/98, and saturating between 85 to 92% on room air.   GENERAL: Moderately built, elderly Caucasian female patient lying in bed, comfortable, conversational, cooperative with exam.   PSYCHIATRIC: Alert and oriented x3. Mood and affect appropriate. Judgment intact.   HEENT: Atraumatic, normocephalic. Oral mucosa moist and pink. External ears and nose normal. No pallor. No icterus. Pupils bilaterally equal and reactive to light.   NECK: Supple. No thyromegaly. No palpable lymph nodes. Trachea midline. No carotid bruit or JVD.   CARDIOVASCULAR: S1 and S2 tachycardic without any murmurs. Peripheral pulses 2+. No edema.   RESPIRATORY: Normal work of breathing. Clear to auscultation on both sides.   GASTROINTESTINAL: Soft abdomen, nontender. Bowel sounds present. No hepatosplenomegaly palpable.   GENITOURINARY: No CVA tenderness or bladder distention.   SKIN: Warm and dry. No petechiae, rash, or ulcers.   MUSCULOSKELETAL: No joint swelling, redness, effusion of the large joints. Has kyphosis. Normal muscle tone.   NEUROLOGICAL: Motor strength 4/5 all over. Sensation to fine touch intact all over.  Cranial nerves II through XII intact.   LABORATORY,  DIAGNOSTIC AND RADIOLOGICAL DATA: Laboratory studies show BUN 16, creatinine 0.66, sodium 140, potassium 4. WBC 10.4, hemoglobin 14.1, and platelets 269. Urinalysis shows 2+ bacteria but only one WBC. EKG shows sinus tachycardia with Q waves in inferior leads, unchanged from prior EKG. Chest x-ray shows some chronic changes. No acute abnormalities. CT of the head shows no acute stroke or mass or bleed.   ASSESSMENT AND PLAN:  1. Generalized weakness likely secondary to dehydration. The patient has had decreased urination and decreased intake, was started on IV fluids.  2. Tachycardia and hypoxia in the setting of being mostly in bed and decreased mobility. Will get a CT scan of the chest to look for PE.  3. Hypertension. The patient mentions that she has labile blood pressure fluctuating between 90/40 to 180. Will monitor at this time. No medications. The patient was on Coreg in the past, which can be started if she has sustained high blood pressure.  4. Parkinson's disease. Continue home medications.  5. Deep vein thrombosis prophylaxis with Lovenox.   Snyder STATUS: DNR/DNI as discussed with the patient with her daughter at bedside.   TIME SPENT TODAY ON THIS CASE: 65 minutes with more than 50% time spent in coordination of care.  ____________________________ [name], MD srs:ap D: 04/27/2012 13:07:52 ET           T: 04/27/2012 13:31:19 ET          JOB#: [id number] cc: Jennifer Snyder. Jennifer Anis, MD, <Dictator> Jennifer Code, MD Jennifer Fisherman MD ELECTRONICALLY SIGNED 04/27/2012 14:18

## 2014-11-21 NOTE — Discharge Summary (Signed)
PATIENT NAME:  Jennifer Snyder, Jennifer Snyder MR#:  [id number] DATE OF BIRTH:  02/24/1936  DATE OF ADMISSION:  04/27/2012 DATE OF DISCHARGE:  04/28/2012  PRIMARY CARE PHYSICIAN: Beverely RisenFozia Khan, MD  CHIEF COMPLAINT: Generalized weakness.   DISCHARGE DIAGNOSES:  1. Weakness secondary to dehydration and also recent use of multiple antibiotics.  2. History of Parkinson's disease.  3. Hypertension.  4. Osteoarthritis.  5. History of esophageal dysmotility.  6. Intolerant to statins and has history of dyslipidemia.   DISCHARGE MEDICATIONS:  1. Methocarbamol 750 mg every six hours as needed for muscle spasms.  2. Pantoprazole 40 mg p.o. daily. 3. Voltaren 1% gel topically to knees.  4. Carbidopa/levodopa 25/100 mg 1-1/2 tablets four times daily. 5. Requip 0.5 mg once a day. 6. Tramadol 50 mg every six hours as needed.  7. Metoprolol 25 mg one tablet daily.   DIET: Low sodium diet.   CONSULTANTS: None.   HOSPITAL COURSE: This is a 79 year old female patient with Parkinson disease and hypertension, not taking medicines, and osteoarthritis and [facility] who came in because of generalized weakness and unable to walk. Look at the history and physical for full details. The patient told me that she took two rounds of antibiotics for dental abscess. After that they had to extract teeth and it was a very difficult procedure. The patient started to feel weak and did not have any fever. The patient denies any dysuria and no headache and no bladder or bowel problems. The patient was admitted for generalized weakness with dehydration and started on IV fluids. CT of the head did not show any acute changes. The patient's urinalysis showed only 2+ bacteria, but no WBC. BUN was 16, creatinine 0.66, and potassium 4. Other labs were within normal limits. She was given IV fluids. She was hypoxic when she came in with tachycardia. The patient had a CT of the chest for PE protocol which was negative. Her  heart rate was 125.Marland Kitchen. She actually was doing good today. Her oxygen saturations were 94% on room air. Also her heart rate is around 80s. The patient is afebrile. Her lab data showed troponins are less than 0.02, white count 7.3, hemoglobin 12.7, hematocrit 37.2, and platelets 230. Electrolytes show a sodium of 141, potassium 3.8, chloride 107, bicarbonate 21, BUN 11, creatinine 0.58, and glucose 111. The patient's troponins have been negative. Urine culture showed contamination. CT of the chest did not show any pulmonary emboli. Her urine showed 2+ bacteria but the culture was contaminated. So the patient is discharged home without any antibiotics because she just improved on antibiotics and do not want her to get into C. difficile unnecessarily.spoke to daughter  and explained the findings. Blood pressure remained high so I started her on Lopressor. The patient's blood pressure was elevated at 148/90 this morning and 170/104 yesterday, so she was started on beta blockers. She is advised to follow with Dr. Beverely RisenFozia Khan. She can continue her Parkinson medication and [facility].   TIME SPENT ON DISCHARGE PREPARATION: More than 30 minutes. ____________________________ Katha HammingSnehalatha Enslee Bibbins, MD sk:slb D: 04/28/2012 22:04:36 ET T: 04/29/2012 14:08:24 ET JOB#: [id number]  cc: Katha HammingSnehalatha Caroleena Paolini, MD, <Dictator> Lyndon CodeFozia M. Khan, MD Katha HammingSNEHALATHA Alayjah Boehringer MD ELECTRONICALLY SIGNED 05/09/2012 14:50

## 2014-11-25 NOTE — Op Note (Signed)
PATIENT NAME:  Jennifer Snyder, Jennifer Snyder MR#:  [id number] DATE OF BIRTH:  Mar 08, 1936  DATE OF PROCEDURE:  07/18/2014   PREOPERATIVE DIAGNOSIS:  Nuclear sclerotic cataract of the left eye.   POSTOPERATIVE DIAGNOSIS:  Nuclear sclerotic cataract of the left eye.   OPERATIVE PROCEDURE:  Cataract extraction by phacoemulsification with implant of intraocular lens to left eye.   SURGEON:  Galen ManilaWilliam Hamna Asa, MD.   ANESTHESIA:  1. Managed anesthesia care.  2. Topical tetracaine drops followed by 2% Xylocaine jelly applied in the preoperative holding area.   COMPLICATIONS:  None.   TECHNIQUE:   Stop and chop   DESCRIPTION OF PROCEDURE:  The patient was examined and consented in the preoperative holding area where the aforementioned topical anesthesia was applied to the left eye and then brought back to the Operating Room where the left eye was prepped and draped in the usual sterile ophthalmic fashion and a lid speculum was placed. A paracentesis was created with the side port blade and the anterior chamber was filled with viscoelastic. A near clear corneal incision was performed with the steel keratome. A continuous curvilinear capsulorrhexis was performed with a cystotome followed by the capsulorrhexis forceps. Hydrodissection and hydrodelineation were carried out with BSS on a blunt cannula. The lens was removed in a stop and chop technique and the remaining cortical material was removed with the irrigation-aspiration handpiece. The capsular bag was inflated with viscoelastic and the Tecnis ZCB00 22.0-diopter lens, serial number [id number] was placed in the capsular bag without complication. The remaining viscoelastic was removed from the eye with the irrigation-aspiration handpiece. The wounds were hydrated. The anterior chamber was flushed with Miostat and the eye was inflated to physiologic pressure. 0.1 mL of cefuroxime concentration 10 mg/mL was placed in the anterior chamber. The wounds were found to be water  tight. The eye was dressed with Vigamox. The patient was given protective glasses to wear throughout the day and a shield with which to sleep tonight. The patient was also given drops with which to begin a drop regimen today and will follow-up with me in one day.    ____________________________ Jerilee FieldWilliam L. Tanay Misuraca, MD wlp:jp D: 07/18/2014 22:00:31 ET T: 07/19/2014 08:29:25 ET JOB#: [id number]  cc: Yardley Lekas L. Lanier Millon, MD, <Dictator> Jerilee FieldWILLIAM L Amaryllis Malmquist MD ELECTRONICALLY SIGNED 07/19/2014 13:56

## 2014-12-03 NOTE — Op Note (Signed)
PATIENT NAME:  Jennifer Snyder, Jennifer Snyder MR#:  [id number] DATE OF BIRTH:  05-25-1936  DATE OF PROCEDURE:  09/19/2014  PREOPERATIVE DIAGNOSIS:  Nuclear sclerotic cataract of the right eye.   POSTOPERATIVE DIAGNOSIS:  Nuclear sclerotic cataract of the right eye.   PROCEDURE PERFORMED:  Cataract extraction by phacoemulsification with implant of intraocular lens to the right eye.   SURGEON:  Galen ManilaWilliam Atalia Litzinger, MD   ANESTHESIA:  General endotracheal anesthesia by laryngeal mask.  COMPLICATIONS:  None.   TECHNIQUE:  Stop and chop.  PROCEDURE IN DETAIL:  The patient was examined, consented for this procedure in the preoperative holding area, and then brought back to the Operating Room, where the Anesthesia team employed general endotracheal anesthesia.  The right eye was prepped and draped in the usual sterile ophthalmic fashion.   A side-port blade was used to create a paracentesis, and the anterior chamber was filled with DisCoVisc. A near-clear corneal incision was created, followed by a continuous curvilinear capsulorrhexis using the cystotome and capsulorrhexis forceps. Hydrodissection and hydrodelineation were carried out with BSS on a blunt cannula. The lens was removed in a four quadrant divide and conquer technique. The remaining cortical material was removed with the irrigation-aspiration handpiece. The capsular bag was inflated with the viscoelastic, and the Tecnis ZCB00, 21.0-diopter lens, serial number [id number], was placed in the right eye. The remaining viscoelastic was removed from the eye. 0.1 mL of cefuroxime concentration 10 mg/mL was placed in the anterior chamber. The wounds were hydrated and found to be watertight. The eye was inflated to a physiologic pressure.     The patient was awakened from general anesthesia without complication, and instructed to begin her regimen of drops that afternoon, and follow up with me in one day.    ____________________________ Jerilee FieldWilliam L. Kent Riendeau,  MD wlp:nb D: 09/19/2014 21:33:00 ET T: 09/20/2014 06:15:29 ET JOB#: [id number]  cc: Aubryanna Nesheim L. Beecher Furio, MD, <Dictator> Jerilee FieldWILLIAM L Justene Jensen MD ELECTRONICALLY SIGNED 09/28/2014 10:30

## 2014-12-05 DIAGNOSIS — R7301 Impaired fasting glucose: Secondary | ICD-10-CM | POA: Diagnosis not present

## 2014-12-05 DIAGNOSIS — D518 Other vitamin B12 deficiency anemias: Secondary | ICD-10-CM | POA: Diagnosis not present

## 2014-12-05 DIAGNOSIS — Z0001 Encounter for general adult medical examination with abnormal findings: Secondary | ICD-10-CM | POA: Diagnosis not present

## 2014-12-05 DIAGNOSIS — E782 Mixed hyperlipidemia: Secondary | ICD-10-CM | POA: Diagnosis not present

## 2014-12-05 DIAGNOSIS — G20 Parkinson's disease: Secondary | ICD-10-CM | POA: Diagnosis not present

## 2014-12-05 DIAGNOSIS — E559 Vitamin D deficiency, unspecified: Secondary | ICD-10-CM | POA: Diagnosis not present

## 2014-12-11 DIAGNOSIS — G20 Parkinson's disease: Secondary | ICD-10-CM | POA: Diagnosis not present

## 2015-01-03 DIAGNOSIS — G5601 Carpal tunnel syndrome, right upper limb: Secondary | ICD-10-CM | POA: Diagnosis not present

## 2015-01-10 DIAGNOSIS — G20 Parkinson's disease: Secondary | ICD-10-CM | POA: Diagnosis not present

## 2015-01-10 DIAGNOSIS — G5601 Carpal tunnel syndrome, right upper limb: Secondary | ICD-10-CM | POA: Diagnosis not present

## 2015-03-13 DIAGNOSIS — G20 Parkinson's disease: Secondary | ICD-10-CM | POA: Diagnosis not present

## 2015-03-13 DIAGNOSIS — R7301 Impaired fasting glucose: Secondary | ICD-10-CM | POA: Diagnosis not present

## 2015-03-13 DIAGNOSIS — M810 Age-related osteoporosis without current pathological fracture: Secondary | ICD-10-CM | POA: Diagnosis not present

## 2015-03-13 DIAGNOSIS — K219 Gastro-esophageal reflux disease without esophagitis: Secondary | ICD-10-CM | POA: Diagnosis not present

## 2015-03-13 DIAGNOSIS — E559 Vitamin D deficiency, unspecified: Secondary | ICD-10-CM | POA: Diagnosis not present

## 2015-03-13 DIAGNOSIS — D518 Other vitamin B12 deficiency anemias: Secondary | ICD-10-CM | POA: Diagnosis not present

## 2015-03-13 DIAGNOSIS — G5601 Carpal tunnel syndrome, right upper limb: Secondary | ICD-10-CM | POA: Diagnosis not present

## 2015-04-12 DIAGNOSIS — G20 Parkinson's disease: Secondary | ICD-10-CM | POA: Diagnosis not present

## 2015-04-12 DIAGNOSIS — G5601 Carpal tunnel syndrome, right upper limb: Secondary | ICD-10-CM | POA: Diagnosis not present

## 2015-04-23 DIAGNOSIS — I119 Hypertensive heart disease without heart failure: Secondary | ICD-10-CM | POA: Diagnosis not present

## 2015-04-23 DIAGNOSIS — G20 Parkinson's disease: Secondary | ICD-10-CM | POA: Diagnosis not present

## 2015-06-12 DIAGNOSIS — D518 Other vitamin B12 deficiency anemias: Secondary | ICD-10-CM | POA: Diagnosis not present

## 2015-06-12 DIAGNOSIS — I119 Hypertensive heart disease without heart failure: Secondary | ICD-10-CM | POA: Diagnosis not present

## 2015-06-12 DIAGNOSIS — M810 Age-related osteoporosis without current pathological fracture: Secondary | ICD-10-CM | POA: Diagnosis not present

## 2015-06-12 DIAGNOSIS — M150 Primary generalized (osteo)arthritis: Secondary | ICD-10-CM | POA: Diagnosis not present

## 2015-06-12 DIAGNOSIS — G4701 Insomnia due to medical condition: Secondary | ICD-10-CM | POA: Diagnosis not present

## 2015-06-12 DIAGNOSIS — G20 Parkinson's disease: Secondary | ICD-10-CM | POA: Diagnosis not present

## 2015-06-12 DIAGNOSIS — R7301 Impaired fasting glucose: Secondary | ICD-10-CM | POA: Diagnosis not present

## 2015-06-14 DIAGNOSIS — M3501 Sicca syndrome with keratoconjunctivitis: Secondary | ICD-10-CM | POA: Diagnosis not present

## 2015-09-26 DIAGNOSIS — G20 Parkinson's disease: Secondary | ICD-10-CM | POA: Diagnosis not present

## 2015-09-26 DIAGNOSIS — G259 Extrapyramidal and movement disorder, unspecified: Secondary | ICD-10-CM | POA: Diagnosis not present

## 2015-09-26 DIAGNOSIS — I119 Hypertensive heart disease without heart failure: Secondary | ICD-10-CM | POA: Diagnosis not present

## 2015-09-26 DIAGNOSIS — F419 Anxiety disorder, unspecified: Secondary | ICD-10-CM | POA: Diagnosis not present

## 2015-09-26 DIAGNOSIS — D518 Other vitamin B12 deficiency anemias: Secondary | ICD-10-CM | POA: Diagnosis not present

## 2015-10-10 DIAGNOSIS — G20 Parkinson's disease: Secondary | ICD-10-CM | POA: Diagnosis not present

## 2015-10-26 DIAGNOSIS — G20 Parkinson's disease: Secondary | ICD-10-CM | POA: Diagnosis not present

## 2015-10-26 DIAGNOSIS — I119 Hypertensive heart disease without heart failure: Secondary | ICD-10-CM | POA: Diagnosis not present

## 2015-10-29 DIAGNOSIS — M6281 Muscle weakness (generalized): Secondary | ICD-10-CM | POA: Diagnosis not present

## 2015-10-29 DIAGNOSIS — G20 Parkinson's disease: Secondary | ICD-10-CM | POA: Diagnosis not present

## 2015-10-29 DIAGNOSIS — G239 Degenerative disease of basal ganglia, unspecified: Secondary | ICD-10-CM | POA: Diagnosis not present

## 2015-10-29 DIAGNOSIS — D649 Anemia, unspecified: Secondary | ICD-10-CM | POA: Diagnosis not present

## 2015-10-29 DIAGNOSIS — G5601 Carpal tunnel syndrome, right upper limb: Secondary | ICD-10-CM | POA: Diagnosis not present

## 2015-10-29 DIAGNOSIS — I10 Essential (primary) hypertension: Secondary | ICD-10-CM | POA: Diagnosis not present

## 2015-10-29 DIAGNOSIS — M4806 Spinal stenosis, lumbar region: Secondary | ICD-10-CM | POA: Diagnosis not present

## 2015-11-01 DIAGNOSIS — G239 Degenerative disease of basal ganglia, unspecified: Secondary | ICD-10-CM | POA: Diagnosis not present

## 2015-11-01 DIAGNOSIS — I10 Essential (primary) hypertension: Secondary | ICD-10-CM | POA: Diagnosis not present

## 2015-11-01 DIAGNOSIS — G20 Parkinson's disease: Secondary | ICD-10-CM | POA: Diagnosis not present

## 2015-11-01 DIAGNOSIS — D649 Anemia, unspecified: Secondary | ICD-10-CM | POA: Diagnosis not present

## 2015-11-01 DIAGNOSIS — M6281 Muscle weakness (generalized): Secondary | ICD-10-CM | POA: Diagnosis not present

## 2015-11-01 DIAGNOSIS — M4806 Spinal stenosis, lumbar region: Secondary | ICD-10-CM | POA: Diagnosis not present

## 2015-11-06 DIAGNOSIS — M4806 Spinal stenosis, lumbar region: Secondary | ICD-10-CM | POA: Diagnosis not present

## 2015-11-06 DIAGNOSIS — D649 Anemia, unspecified: Secondary | ICD-10-CM | POA: Diagnosis not present

## 2015-11-06 DIAGNOSIS — G20 Parkinson's disease: Secondary | ICD-10-CM | POA: Diagnosis not present

## 2015-11-06 DIAGNOSIS — G239 Degenerative disease of basal ganglia, unspecified: Secondary | ICD-10-CM | POA: Diagnosis not present

## 2015-11-06 DIAGNOSIS — I10 Essential (primary) hypertension: Secondary | ICD-10-CM | POA: Diagnosis not present

## 2015-11-06 DIAGNOSIS — M6281 Muscle weakness (generalized): Secondary | ICD-10-CM | POA: Diagnosis not present

## 2015-11-09 DIAGNOSIS — I10 Essential (primary) hypertension: Secondary | ICD-10-CM | POA: Diagnosis not present

## 2015-11-09 DIAGNOSIS — D649 Anemia, unspecified: Secondary | ICD-10-CM | POA: Diagnosis not present

## 2015-11-09 DIAGNOSIS — G20 Parkinson's disease: Secondary | ICD-10-CM | POA: Diagnosis not present

## 2015-11-09 DIAGNOSIS — M6281 Muscle weakness (generalized): Secondary | ICD-10-CM | POA: Diagnosis not present

## 2015-11-09 DIAGNOSIS — G239 Degenerative disease of basal ganglia, unspecified: Secondary | ICD-10-CM | POA: Diagnosis not present

## 2015-11-09 DIAGNOSIS — M4806 Spinal stenosis, lumbar region: Secondary | ICD-10-CM | POA: Diagnosis not present

## 2015-11-13 DIAGNOSIS — G20 Parkinson's disease: Secondary | ICD-10-CM | POA: Diagnosis not present

## 2015-11-13 DIAGNOSIS — M4806 Spinal stenosis, lumbar region: Secondary | ICD-10-CM | POA: Diagnosis not present

## 2015-11-13 DIAGNOSIS — D649 Anemia, unspecified: Secondary | ICD-10-CM | POA: Diagnosis not present

## 2015-11-13 DIAGNOSIS — M6281 Muscle weakness (generalized): Secondary | ICD-10-CM | POA: Diagnosis not present

## 2015-11-13 DIAGNOSIS — I10 Essential (primary) hypertension: Secondary | ICD-10-CM | POA: Diagnosis not present

## 2015-11-13 DIAGNOSIS — G239 Degenerative disease of basal ganglia, unspecified: Secondary | ICD-10-CM | POA: Diagnosis not present

## 2015-11-15 DIAGNOSIS — G239 Degenerative disease of basal ganglia, unspecified: Secondary | ICD-10-CM | POA: Diagnosis not present

## 2015-11-15 DIAGNOSIS — I10 Essential (primary) hypertension: Secondary | ICD-10-CM | POA: Diagnosis not present

## 2015-11-15 DIAGNOSIS — M4806 Spinal stenosis, lumbar region: Secondary | ICD-10-CM | POA: Diagnosis not present

## 2015-11-15 DIAGNOSIS — D649 Anemia, unspecified: Secondary | ICD-10-CM | POA: Diagnosis not present

## 2015-11-15 DIAGNOSIS — G20 Parkinson's disease: Secondary | ICD-10-CM | POA: Diagnosis not present

## 2015-11-15 DIAGNOSIS — M6281 Muscle weakness (generalized): Secondary | ICD-10-CM | POA: Diagnosis not present

## 2015-12-12 DIAGNOSIS — M6281 Muscle weakness (generalized): Secondary | ICD-10-CM | POA: Diagnosis not present

## 2015-12-12 DIAGNOSIS — M4806 Spinal stenosis, lumbar region: Secondary | ICD-10-CM | POA: Diagnosis not present

## 2015-12-12 DIAGNOSIS — G239 Degenerative disease of basal ganglia, unspecified: Secondary | ICD-10-CM | POA: Diagnosis not present

## 2015-12-12 DIAGNOSIS — D649 Anemia, unspecified: Secondary | ICD-10-CM | POA: Diagnosis not present

## 2015-12-12 DIAGNOSIS — I10 Essential (primary) hypertension: Secondary | ICD-10-CM | POA: Diagnosis not present

## 2015-12-12 DIAGNOSIS — G20 Parkinson's disease: Secondary | ICD-10-CM | POA: Diagnosis not present

## 2015-12-13 DIAGNOSIS — D649 Anemia, unspecified: Secondary | ICD-10-CM | POA: Diagnosis not present

## 2015-12-13 DIAGNOSIS — G20 Parkinson's disease: Secondary | ICD-10-CM | POA: Diagnosis not present

## 2015-12-13 DIAGNOSIS — M4806 Spinal stenosis, lumbar region: Secondary | ICD-10-CM | POA: Diagnosis not present

## 2015-12-13 DIAGNOSIS — I10 Essential (primary) hypertension: Secondary | ICD-10-CM | POA: Diagnosis not present

## 2015-12-13 DIAGNOSIS — M6281 Muscle weakness (generalized): Secondary | ICD-10-CM | POA: Diagnosis not present

## 2015-12-13 DIAGNOSIS — G239 Degenerative disease of basal ganglia, unspecified: Secondary | ICD-10-CM | POA: Diagnosis not present

## 2015-12-18 DIAGNOSIS — D518 Other vitamin B12 deficiency anemias: Secondary | ICD-10-CM | POA: Diagnosis not present

## 2015-12-18 DIAGNOSIS — D6481 Anemia due to antineoplastic chemotherapy: Secondary | ICD-10-CM | POA: Diagnosis not present

## 2015-12-18 DIAGNOSIS — G20 Parkinson's disease: Secondary | ICD-10-CM | POA: Diagnosis not present

## 2015-12-18 DIAGNOSIS — D509 Iron deficiency anemia, unspecified: Secondary | ICD-10-CM | POA: Diagnosis not present

## 2015-12-18 DIAGNOSIS — Z0001 Encounter for general adult medical examination with abnormal findings: Secondary | ICD-10-CM | POA: Diagnosis not present

## 2015-12-18 DIAGNOSIS — K219 Gastro-esophageal reflux disease without esophagitis: Secondary | ICD-10-CM | POA: Diagnosis not present

## 2015-12-18 DIAGNOSIS — I119 Hypertensive heart disease without heart failure: Secondary | ICD-10-CM | POA: Diagnosis not present

## 2015-12-18 DIAGNOSIS — K648 Other hemorrhoids: Secondary | ICD-10-CM | POA: Diagnosis not present

## 2015-12-18 DIAGNOSIS — E559 Vitamin D deficiency, unspecified: Secondary | ICD-10-CM | POA: Diagnosis not present

## 2015-12-18 DIAGNOSIS — M4806 Spinal stenosis, lumbar region: Secondary | ICD-10-CM | POA: Diagnosis not present

## 2015-12-18 DIAGNOSIS — D609 Acquired pure red cell aplasia, unspecified: Secondary | ICD-10-CM | POA: Diagnosis not present

## 2015-12-18 DIAGNOSIS — R5383 Other fatigue: Secondary | ICD-10-CM | POA: Diagnosis not present

## 2015-12-25 DIAGNOSIS — G5601 Carpal tunnel syndrome, right upper limb: Secondary | ICD-10-CM | POA: Diagnosis not present

## 2015-12-25 DIAGNOSIS — G20 Parkinson's disease: Secondary | ICD-10-CM | POA: Diagnosis not present

## 2016-01-15 DIAGNOSIS — Z9181 History of falling: Secondary | ICD-10-CM | POA: Diagnosis not present

## 2016-01-15 DIAGNOSIS — D649 Anemia, unspecified: Secondary | ICD-10-CM | POA: Diagnosis not present

## 2016-01-15 DIAGNOSIS — R2689 Other abnormalities of gait and mobility: Secondary | ICD-10-CM | POA: Diagnosis not present

## 2016-01-15 DIAGNOSIS — M4806 Spinal stenosis, lumbar region: Secondary | ICD-10-CM | POA: Diagnosis not present

## 2016-01-15 DIAGNOSIS — G20 Parkinson's disease: Secondary | ICD-10-CM | POA: Diagnosis not present

## 2016-01-15 DIAGNOSIS — I10 Essential (primary) hypertension: Secondary | ICD-10-CM | POA: Diagnosis not present

## 2016-01-15 DIAGNOSIS — G5601 Carpal tunnel syndrome, right upper limb: Secondary | ICD-10-CM | POA: Diagnosis not present

## 2016-01-22 DIAGNOSIS — M4806 Spinal stenosis, lumbar region: Secondary | ICD-10-CM | POA: Diagnosis not present

## 2016-01-22 DIAGNOSIS — I10 Essential (primary) hypertension: Secondary | ICD-10-CM | POA: Diagnosis not present

## 2016-01-22 DIAGNOSIS — R2689 Other abnormalities of gait and mobility: Secondary | ICD-10-CM | POA: Diagnosis not present

## 2016-01-22 DIAGNOSIS — G20 Parkinson's disease: Secondary | ICD-10-CM | POA: Diagnosis not present

## 2016-01-22 DIAGNOSIS — G5601 Carpal tunnel syndrome, right upper limb: Secondary | ICD-10-CM | POA: Diagnosis not present

## 2016-01-22 DIAGNOSIS — D649 Anemia, unspecified: Secondary | ICD-10-CM | POA: Diagnosis not present

## 2016-01-25 DIAGNOSIS — R2689 Other abnormalities of gait and mobility: Secondary | ICD-10-CM | POA: Diagnosis not present

## 2016-01-25 DIAGNOSIS — I10 Essential (primary) hypertension: Secondary | ICD-10-CM | POA: Diagnosis not present

## 2016-01-25 DIAGNOSIS — D649 Anemia, unspecified: Secondary | ICD-10-CM | POA: Diagnosis not present

## 2016-01-25 DIAGNOSIS — G5601 Carpal tunnel syndrome, right upper limb: Secondary | ICD-10-CM | POA: Diagnosis not present

## 2016-01-25 DIAGNOSIS — G20 Parkinson's disease: Secondary | ICD-10-CM | POA: Diagnosis not present

## 2016-01-25 DIAGNOSIS — M4806 Spinal stenosis, lumbar region: Secondary | ICD-10-CM | POA: Diagnosis not present

## 2016-01-29 DIAGNOSIS — D649 Anemia, unspecified: Secondary | ICD-10-CM | POA: Diagnosis not present

## 2016-01-29 DIAGNOSIS — R2689 Other abnormalities of gait and mobility: Secondary | ICD-10-CM | POA: Diagnosis not present

## 2016-01-29 DIAGNOSIS — M4806 Spinal stenosis, lumbar region: Secondary | ICD-10-CM | POA: Diagnosis not present

## 2016-01-29 DIAGNOSIS — G5601 Carpal tunnel syndrome, right upper limb: Secondary | ICD-10-CM | POA: Diagnosis not present

## 2016-01-29 DIAGNOSIS — G20 Parkinson's disease: Secondary | ICD-10-CM | POA: Diagnosis not present

## 2016-01-29 DIAGNOSIS — I10 Essential (primary) hypertension: Secondary | ICD-10-CM | POA: Diagnosis not present

## 2016-01-31 DIAGNOSIS — M4806 Spinal stenosis, lumbar region: Secondary | ICD-10-CM | POA: Diagnosis not present

## 2016-01-31 DIAGNOSIS — R2689 Other abnormalities of gait and mobility: Secondary | ICD-10-CM | POA: Diagnosis not present

## 2016-01-31 DIAGNOSIS — D649 Anemia, unspecified: Secondary | ICD-10-CM | POA: Diagnosis not present

## 2016-01-31 DIAGNOSIS — G5601 Carpal tunnel syndrome, right upper limb: Secondary | ICD-10-CM | POA: Diagnosis not present

## 2016-01-31 DIAGNOSIS — G20 Parkinson's disease: Secondary | ICD-10-CM | POA: Diagnosis not present

## 2016-01-31 DIAGNOSIS — I10 Essential (primary) hypertension: Secondary | ICD-10-CM | POA: Diagnosis not present

## 2016-02-04 DIAGNOSIS — G20 Parkinson's disease: Secondary | ICD-10-CM | POA: Diagnosis not present

## 2016-02-04 DIAGNOSIS — D649 Anemia, unspecified: Secondary | ICD-10-CM | POA: Diagnosis not present

## 2016-02-04 DIAGNOSIS — I10 Essential (primary) hypertension: Secondary | ICD-10-CM | POA: Diagnosis not present

## 2016-02-04 DIAGNOSIS — G5601 Carpal tunnel syndrome, right upper limb: Secondary | ICD-10-CM | POA: Diagnosis not present

## 2016-02-04 DIAGNOSIS — R2689 Other abnormalities of gait and mobility: Secondary | ICD-10-CM | POA: Diagnosis not present

## 2016-02-04 DIAGNOSIS — M4806 Spinal stenosis, lumbar region: Secondary | ICD-10-CM | POA: Diagnosis not present

## 2016-02-07 DIAGNOSIS — R2689 Other abnormalities of gait and mobility: Secondary | ICD-10-CM | POA: Diagnosis not present

## 2016-02-07 DIAGNOSIS — M4806 Spinal stenosis, lumbar region: Secondary | ICD-10-CM | POA: Diagnosis not present

## 2016-02-07 DIAGNOSIS — D649 Anemia, unspecified: Secondary | ICD-10-CM | POA: Diagnosis not present

## 2016-02-07 DIAGNOSIS — G20 Parkinson's disease: Secondary | ICD-10-CM | POA: Diagnosis not present

## 2016-02-07 DIAGNOSIS — G5601 Carpal tunnel syndrome, right upper limb: Secondary | ICD-10-CM | POA: Diagnosis not present

## 2016-02-07 DIAGNOSIS — I10 Essential (primary) hypertension: Secondary | ICD-10-CM | POA: Diagnosis not present

## 2016-02-11 DIAGNOSIS — G5601 Carpal tunnel syndrome, right upper limb: Secondary | ICD-10-CM | POA: Diagnosis not present

## 2016-02-11 DIAGNOSIS — G20 Parkinson's disease: Secondary | ICD-10-CM | POA: Diagnosis not present

## 2016-02-11 DIAGNOSIS — I10 Essential (primary) hypertension: Secondary | ICD-10-CM | POA: Diagnosis not present

## 2016-02-11 DIAGNOSIS — M4806 Spinal stenosis, lumbar region: Secondary | ICD-10-CM | POA: Diagnosis not present

## 2016-02-11 DIAGNOSIS — R2689 Other abnormalities of gait and mobility: Secondary | ICD-10-CM | POA: Diagnosis not present

## 2016-02-11 DIAGNOSIS — D649 Anemia, unspecified: Secondary | ICD-10-CM | POA: Diagnosis not present

## 2016-02-13 DIAGNOSIS — R2689 Other abnormalities of gait and mobility: Secondary | ICD-10-CM | POA: Diagnosis not present

## 2016-02-13 DIAGNOSIS — G20 Parkinson's disease: Secondary | ICD-10-CM | POA: Diagnosis not present

## 2016-02-13 DIAGNOSIS — G5601 Carpal tunnel syndrome, right upper limb: Secondary | ICD-10-CM | POA: Diagnosis not present

## 2016-02-13 DIAGNOSIS — I10 Essential (primary) hypertension: Secondary | ICD-10-CM | POA: Diagnosis not present

## 2016-02-13 DIAGNOSIS — M4806 Spinal stenosis, lumbar region: Secondary | ICD-10-CM | POA: Diagnosis not present

## 2016-02-13 DIAGNOSIS — D649 Anemia, unspecified: Secondary | ICD-10-CM | POA: Diagnosis not present

## 2016-02-19 DIAGNOSIS — M4806 Spinal stenosis, lumbar region: Secondary | ICD-10-CM | POA: Diagnosis not present

## 2016-02-19 DIAGNOSIS — G5601 Carpal tunnel syndrome, right upper limb: Secondary | ICD-10-CM | POA: Diagnosis not present

## 2016-02-19 DIAGNOSIS — D649 Anemia, unspecified: Secondary | ICD-10-CM | POA: Diagnosis not present

## 2016-02-19 DIAGNOSIS — G20 Parkinson's disease: Secondary | ICD-10-CM | POA: Diagnosis not present

## 2016-02-19 DIAGNOSIS — R2689 Other abnormalities of gait and mobility: Secondary | ICD-10-CM | POA: Diagnosis not present

## 2016-02-19 DIAGNOSIS — I10 Essential (primary) hypertension: Secondary | ICD-10-CM | POA: Diagnosis not present

## 2016-02-25 DIAGNOSIS — I10 Essential (primary) hypertension: Secondary | ICD-10-CM | POA: Diagnosis not present

## 2016-02-25 DIAGNOSIS — R2689 Other abnormalities of gait and mobility: Secondary | ICD-10-CM | POA: Diagnosis not present

## 2016-02-25 DIAGNOSIS — G5601 Carpal tunnel syndrome, right upper limb: Secondary | ICD-10-CM | POA: Diagnosis not present

## 2016-02-25 DIAGNOSIS — M4806 Spinal stenosis, lumbar region: Secondary | ICD-10-CM | POA: Diagnosis not present

## 2016-02-25 DIAGNOSIS — G20 Parkinson's disease: Secondary | ICD-10-CM | POA: Diagnosis not present

## 2016-02-25 DIAGNOSIS — D649 Anemia, unspecified: Secondary | ICD-10-CM | POA: Diagnosis not present

## 2016-02-27 DIAGNOSIS — R2689 Other abnormalities of gait and mobility: Secondary | ICD-10-CM | POA: Diagnosis not present

## 2016-02-27 DIAGNOSIS — M4806 Spinal stenosis, lumbar region: Secondary | ICD-10-CM | POA: Diagnosis not present

## 2016-02-27 DIAGNOSIS — G5601 Carpal tunnel syndrome, right upper limb: Secondary | ICD-10-CM | POA: Diagnosis not present

## 2016-02-27 DIAGNOSIS — I10 Essential (primary) hypertension: Secondary | ICD-10-CM | POA: Diagnosis not present

## 2016-02-27 DIAGNOSIS — G20 Parkinson's disease: Secondary | ICD-10-CM | POA: Diagnosis not present

## 2016-02-27 DIAGNOSIS — D649 Anemia, unspecified: Secondary | ICD-10-CM | POA: Diagnosis not present

## 2016-03-03 DIAGNOSIS — R2689 Other abnormalities of gait and mobility: Secondary | ICD-10-CM | POA: Diagnosis not present

## 2016-03-03 DIAGNOSIS — I10 Essential (primary) hypertension: Secondary | ICD-10-CM | POA: Diagnosis not present

## 2016-03-03 DIAGNOSIS — D649 Anemia, unspecified: Secondary | ICD-10-CM | POA: Diagnosis not present

## 2016-03-03 DIAGNOSIS — M4806 Spinal stenosis, lumbar region: Secondary | ICD-10-CM | POA: Diagnosis not present

## 2016-03-03 DIAGNOSIS — G5601 Carpal tunnel syndrome, right upper limb: Secondary | ICD-10-CM | POA: Diagnosis not present

## 2016-03-03 DIAGNOSIS — G20 Parkinson's disease: Secondary | ICD-10-CM | POA: Diagnosis not present

## 2016-03-05 DIAGNOSIS — G20 Parkinson's disease: Secondary | ICD-10-CM | POA: Diagnosis not present

## 2016-03-05 DIAGNOSIS — R2689 Other abnormalities of gait and mobility: Secondary | ICD-10-CM | POA: Diagnosis not present

## 2016-03-05 DIAGNOSIS — I10 Essential (primary) hypertension: Secondary | ICD-10-CM | POA: Diagnosis not present

## 2016-03-05 DIAGNOSIS — D649 Anemia, unspecified: Secondary | ICD-10-CM | POA: Diagnosis not present

## 2016-03-05 DIAGNOSIS — G5601 Carpal tunnel syndrome, right upper limb: Secondary | ICD-10-CM | POA: Diagnosis not present

## 2016-03-05 DIAGNOSIS — M4806 Spinal stenosis, lumbar region: Secondary | ICD-10-CM | POA: Diagnosis not present

## 2016-03-11 DIAGNOSIS — D649 Anemia, unspecified: Secondary | ICD-10-CM | POA: Diagnosis not present

## 2016-03-11 DIAGNOSIS — I10 Essential (primary) hypertension: Secondary | ICD-10-CM | POA: Diagnosis not present

## 2016-03-11 DIAGNOSIS — M4806 Spinal stenosis, lumbar region: Secondary | ICD-10-CM | POA: Diagnosis not present

## 2016-03-11 DIAGNOSIS — G20 Parkinson's disease: Secondary | ICD-10-CM | POA: Diagnosis not present

## 2016-03-11 DIAGNOSIS — R2689 Other abnormalities of gait and mobility: Secondary | ICD-10-CM | POA: Diagnosis not present

## 2016-03-11 DIAGNOSIS — G5601 Carpal tunnel syndrome, right upper limb: Secondary | ICD-10-CM | POA: Diagnosis not present

## 2016-03-13 DIAGNOSIS — R2689 Other abnormalities of gait and mobility: Secondary | ICD-10-CM | POA: Diagnosis not present

## 2016-03-13 DIAGNOSIS — M4806 Spinal stenosis, lumbar region: Secondary | ICD-10-CM | POA: Diagnosis not present

## 2016-03-13 DIAGNOSIS — I10 Essential (primary) hypertension: Secondary | ICD-10-CM | POA: Diagnosis not present

## 2016-03-13 DIAGNOSIS — D649 Anemia, unspecified: Secondary | ICD-10-CM | POA: Diagnosis not present

## 2016-03-13 DIAGNOSIS — G5601 Carpal tunnel syndrome, right upper limb: Secondary | ICD-10-CM | POA: Diagnosis not present

## 2016-03-13 DIAGNOSIS — G20 Parkinson's disease: Secondary | ICD-10-CM | POA: Diagnosis not present

## 2016-03-18 DIAGNOSIS — G5601 Carpal tunnel syndrome, right upper limb: Secondary | ICD-10-CM | POA: Diagnosis not present

## 2016-03-18 DIAGNOSIS — I10 Essential (primary) hypertension: Secondary | ICD-10-CM | POA: Diagnosis not present

## 2016-03-18 DIAGNOSIS — G20 Parkinson's disease: Secondary | ICD-10-CM | POA: Diagnosis not present

## 2016-03-18 DIAGNOSIS — M810 Age-related osteoporosis without current pathological fracture: Secondary | ICD-10-CM | POA: Diagnosis not present

## 2016-03-18 DIAGNOSIS — G259 Extrapyramidal and movement disorder, unspecified: Secondary | ICD-10-CM | POA: Diagnosis not present

## 2016-03-18 DIAGNOSIS — E559 Vitamin D deficiency, unspecified: Secondary | ICD-10-CM | POA: Diagnosis not present

## 2016-05-13 DIAGNOSIS — R296 Repeated falls: Secondary | ICD-10-CM | POA: Diagnosis not present

## 2016-05-13 DIAGNOSIS — G5601 Carpal tunnel syndrome, right upper limb: Secondary | ICD-10-CM | POA: Diagnosis not present

## 2016-05-13 DIAGNOSIS — G20 Parkinson's disease: Secondary | ICD-10-CM | POA: Diagnosis not present

## 2016-07-15 DIAGNOSIS — G20 Parkinson's disease: Secondary | ICD-10-CM | POA: Diagnosis not present

## 2016-07-15 DIAGNOSIS — D518 Other vitamin B12 deficiency anemias: Secondary | ICD-10-CM | POA: Diagnosis not present

## 2016-07-15 DIAGNOSIS — M791 Myalgia: Secondary | ICD-10-CM | POA: Diagnosis not present

## 2016-07-15 DIAGNOSIS — G259 Extrapyramidal and movement disorder, unspecified: Secondary | ICD-10-CM | POA: Diagnosis not present

## 2016-07-15 DIAGNOSIS — F419 Anxiety disorder, unspecified: Secondary | ICD-10-CM | POA: Diagnosis not present

## 2016-07-15 DIAGNOSIS — I10 Essential (primary) hypertension: Secondary | ICD-10-CM | POA: Diagnosis not present

## 2016-09-10 DIAGNOSIS — R296 Repeated falls: Secondary | ICD-10-CM | POA: Diagnosis not present

## 2016-09-10 DIAGNOSIS — G20 Parkinson's disease: Secondary | ICD-10-CM | POA: Diagnosis not present

## 2016-09-10 DIAGNOSIS — G5601 Carpal tunnel syndrome, right upper limb: Secondary | ICD-10-CM | POA: Diagnosis not present

## 2016-10-15 DIAGNOSIS — H26491 Other secondary cataract, right eye: Secondary | ICD-10-CM | POA: Diagnosis not present

## 2016-10-21 DIAGNOSIS — F419 Anxiety disorder, unspecified: Secondary | ICD-10-CM | POA: Diagnosis not present

## 2016-10-21 DIAGNOSIS — I10 Essential (primary) hypertension: Secondary | ICD-10-CM | POA: Diagnosis not present

## 2016-10-21 DIAGNOSIS — G20 Parkinson's disease: Secondary | ICD-10-CM | POA: Diagnosis not present

## 2016-10-21 DIAGNOSIS — M150 Primary generalized (osteo)arthritis: Secondary | ICD-10-CM | POA: Diagnosis not present

## 2016-10-21 DIAGNOSIS — D519 Vitamin B12 deficiency anemia, unspecified: Secondary | ICD-10-CM | POA: Diagnosis not present

## 2016-10-23 DIAGNOSIS — H26492 Other secondary cataract, left eye: Secondary | ICD-10-CM | POA: Diagnosis not present

## 2016-11-02 HISTORY — PX: OTHER SURGICAL HISTORY: SHX169

## 2016-12-09 DIAGNOSIS — G5601 Carpal tunnel syndrome, right upper limb: Secondary | ICD-10-CM | POA: Diagnosis not present

## 2016-12-09 DIAGNOSIS — R296 Repeated falls: Secondary | ICD-10-CM | POA: Diagnosis not present

## 2016-12-09 DIAGNOSIS — G20 Parkinson's disease: Secondary | ICD-10-CM | POA: Diagnosis not present

## 2016-12-23 DIAGNOSIS — R300 Dysuria: Secondary | ICD-10-CM | POA: Diagnosis not present

## 2016-12-23 DIAGNOSIS — Z0001 Encounter for general adult medical examination with abnormal findings: Secondary | ICD-10-CM | POA: Diagnosis not present

## 2016-12-23 DIAGNOSIS — G20 Parkinson's disease: Secondary | ICD-10-CM | POA: Diagnosis not present

## 2016-12-23 DIAGNOSIS — E559 Vitamin D deficiency, unspecified: Secondary | ICD-10-CM | POA: Diagnosis not present

## 2016-12-23 DIAGNOSIS — E782 Mixed hyperlipidemia: Secondary | ICD-10-CM | POA: Diagnosis not present

## 2016-12-23 DIAGNOSIS — M150 Primary generalized (osteo)arthritis: Secondary | ICD-10-CM | POA: Diagnosis not present

## 2016-12-23 DIAGNOSIS — I10 Essential (primary) hypertension: Secondary | ICD-10-CM | POA: Diagnosis not present

## 2016-12-23 DIAGNOSIS — F419 Anxiety disorder, unspecified: Secondary | ICD-10-CM | POA: Diagnosis not present

## 2016-12-23 DIAGNOSIS — R7301 Impaired fasting glucose: Secondary | ICD-10-CM | POA: Diagnosis not present

## 2017-02-13 DIAGNOSIS — G20 Parkinson's disease: Secondary | ICD-10-CM | POA: Diagnosis not present

## 2017-02-13 DIAGNOSIS — R0989 Other specified symptoms and signs involving the circulatory and respiratory systems: Secondary | ICD-10-CM | POA: Diagnosis not present

## 2017-02-13 DIAGNOSIS — G9389 Other specified disorders of brain: Secondary | ICD-10-CM | POA: Diagnosis not present

## 2017-02-13 DIAGNOSIS — R296 Repeated falls: Secondary | ICD-10-CM | POA: Diagnosis not present

## 2017-02-13 DIAGNOSIS — M6281 Muscle weakness (generalized): Secondary | ICD-10-CM | POA: Diagnosis not present

## 2017-02-13 DIAGNOSIS — R258 Other abnormal involuntary movements: Secondary | ICD-10-CM | POA: Diagnosis not present

## 2017-02-13 DIAGNOSIS — R4182 Altered mental status, unspecified: Secondary | ICD-10-CM | POA: Diagnosis not present

## 2017-02-13 DIAGNOSIS — R930 Abnormal findings on diagnostic imaging of skull and head, not elsewhere classified: Secondary | ICD-10-CM | POA: Diagnosis not present

## 2017-02-13 DIAGNOSIS — R29810 Facial weakness: Secondary | ICD-10-CM | POA: Diagnosis not present

## 2017-02-13 DIAGNOSIS — R9431 Abnormal electrocardiogram [ECG] [EKG]: Secondary | ICD-10-CM | POA: Diagnosis not present

## 2017-02-13 DIAGNOSIS — I679 Cerebrovascular disease, unspecified: Secondary | ICD-10-CM | POA: Diagnosis not present

## 2017-02-13 DIAGNOSIS — I629 Nontraumatic intracranial hemorrhage, unspecified: Secondary | ICD-10-CM | POA: Diagnosis not present

## 2017-02-13 DIAGNOSIS — Z8673 Personal history of transient ischemic attack (TIA), and cerebral infarction without residual deficits: Secondary | ICD-10-CM | POA: Diagnosis not present

## 2017-02-13 DIAGNOSIS — R531 Weakness: Secondary | ICD-10-CM | POA: Diagnosis not present

## 2017-02-13 DIAGNOSIS — E119 Type 2 diabetes mellitus without complications: Secondary | ICD-10-CM | POA: Diagnosis not present

## 2017-04-07 DIAGNOSIS — G20 Parkinson's disease: Secondary | ICD-10-CM | POA: Diagnosis not present

## 2017-04-07 DIAGNOSIS — G459 Transient cerebral ischemic attack, unspecified: Secondary | ICD-10-CM | POA: Diagnosis not present

## 2017-04-07 DIAGNOSIS — G5601 Carpal tunnel syndrome, right upper limb: Secondary | ICD-10-CM | POA: Diagnosis not present

## 2017-04-28 DIAGNOSIS — D519 Vitamin B12 deficiency anemia, unspecified: Secondary | ICD-10-CM | POA: Diagnosis not present

## 2017-04-28 DIAGNOSIS — I10 Essential (primary) hypertension: Secondary | ICD-10-CM | POA: Diagnosis not present

## 2017-04-28 DIAGNOSIS — G459 Transient cerebral ischemic attack, unspecified: Secondary | ICD-10-CM | POA: Diagnosis not present

## 2017-04-28 DIAGNOSIS — G259 Extrapyramidal and movement disorder, unspecified: Secondary | ICD-10-CM | POA: Diagnosis not present

## 2017-04-28 DIAGNOSIS — G20 Parkinson's disease: Secondary | ICD-10-CM | POA: Diagnosis not present

## 2017-04-28 DIAGNOSIS — H6503 Acute serous otitis media, bilateral: Secondary | ICD-10-CM | POA: Diagnosis not present

## 2017-07-07 DIAGNOSIS — Z8673 Personal history of transient ischemic attack (TIA), and cerebral infarction without residual deficits: Secondary | ICD-10-CM | POA: Insufficient documentation

## 2017-07-07 DIAGNOSIS — G20 Parkinson's disease: Secondary | ICD-10-CM | POA: Diagnosis not present

## 2017-07-07 DIAGNOSIS — G5600 Carpal tunnel syndrome, unspecified upper limb: Secondary | ICD-10-CM | POA: Diagnosis not present

## 2017-07-23 ENCOUNTER — Telehealth: Payer: Self-pay

## 2017-07-23 NOTE — Telephone Encounter (Signed)
Spoke to pt and advised that lab slip was ready to be picked up and at front desk.  dbs

## 2017-07-24 ENCOUNTER — Other Ambulatory Visit
Admission: RE | Admit: 2017-07-24 | Discharge: 2017-07-24 | Disposition: A | Discharge status: Home / Self Care | Payer: Medicare Other | Source: Ambulatory Visit | Attending: Nurse Practitioner | Admitting: Nurse Practitioner

## 2017-07-24 DIAGNOSIS — D509 Iron deficiency anemia, unspecified: Secondary | ICD-10-CM | POA: Diagnosis not present

## 2017-07-24 DIAGNOSIS — R531 Weakness: Secondary | ICD-10-CM | POA: Diagnosis not present

## 2017-07-24 DIAGNOSIS — R319 Hematuria, unspecified: Secondary | ICD-10-CM | POA: Insufficient documentation

## 2017-07-24 LAB — CBC WITH DIFFERENTIAL/PLATELET
Basophils Absolute: 0.1 10*3/uL (ref 0–0.1)
Basophils Relative: 1 %
EOS ABS: 0 10*3/uL (ref 0–0.7)
EOS PCT: 0 %
HCT: 34.4 % — ABNORMAL LOW (ref 35.0–47.0)
Hemoglobin: 11.5 g/dL — ABNORMAL LOW (ref 12.0–16.0)
LYMPHS ABS: 1.8 10*3/uL (ref 1.0–3.6)
Lymphocytes Relative: 17 %
MCH: 28.6 pg (ref 26.0–34.0)
MCHC: 33.5 g/dL (ref 32.0–36.0)
MCV: 85.5 fL (ref 80.0–100.0)
MONOS PCT: 8 %
Monocytes Absolute: 0.8 10*3/uL (ref 0.2–0.9)
Neutro Abs: 7.8 10*3/uL — ABNORMAL HIGH (ref 1.4–6.5)
Neutrophils Relative %: 74 %
PLATELETS: 384 10*3/uL (ref 150–440)
RBC: 4.02 MIL/uL (ref 3.80–5.20)
RDW: 12.7 % (ref 11.5–14.5)
WBC: 10.6 10*3/uL (ref 3.6–11.0)

## 2017-07-24 LAB — URINALYSIS, COMPLETE (UACMP) WITH MICROSCOPIC
GLUCOSE, UA: NEGATIVE mg/dL
Hgb urine dipstick: NEGATIVE
Ketones, ur: 15 mg/dL — AB
Leukocytes, UA: NEGATIVE
Nitrite: NEGATIVE
PH: 5.5 (ref 5.0–8.0)
Specific Gravity, Urine: 1.015 (ref 1.005–1.030)

## 2017-07-24 LAB — COMPREHENSIVE METABOLIC PANEL
ALT: 6 U/L — AB (ref 14–54)
AST: 17 U/L (ref 15–41)
Albumin: 3.1 g/dL — ABNORMAL LOW (ref 3.5–5.0)
Alkaline Phosphatase: 82 U/L (ref 38–126)
Anion gap: 11 (ref 5–15)
BUN: 25 mg/dL — AB (ref 6–20)
CHLORIDE: 100 mmol/L — AB (ref 101–111)
CO2: 29 mmol/L (ref 22–32)
CREATININE: 0.84 mg/dL (ref 0.44–1.00)
Calcium: 8.7 mg/dL — ABNORMAL LOW (ref 8.9–10.3)
GFR calc Af Amer: >60 mL/min (ref >60)
GFR calc non Af Amer: >60 mL/min (ref >60)
GLUCOSE: 118 mg/dL — AB (ref 65–99)
Potassium: 3.4 mmol/L — ABNORMAL LOW (ref 3.5–5.1)
SODIUM: 140 mmol/L (ref 135–145)
Total Bilirubin: 0.4 mg/dL (ref 0.3–1.2)
Total Protein: 6.6 g/dL (ref 6.5–8.1)

## 2017-07-25 LAB — FERRITIN: FERRITIN: 48 ng/mL (ref 11–307)

## 2017-07-25 LAB — IRON AND TIBC
IRON: 20 ug/dL — AB (ref 28–170)
SATURATION RATIOS: 7 % — AB (ref 10.4–31.8)
TIBC: 307 ug/dL (ref 250–450)
UIBC: 287 ug/dL

## 2017-07-25 LAB — MISC LABCORP TEST (SEND OUT): Labcorp test code: 810

## 2017-07-25 LAB — T4, FREE: Free T4: 1.04 ng/dL (ref 0.61–1.12)

## 2017-07-25 LAB — TSH: TSH: 0.905 u[IU]/mL (ref 0.350–4.500)

## 2017-07-26 LAB — URINE CULTURE

## 2017-07-27 ENCOUNTER — Other Ambulatory Visit: Payer: Self-pay | Admitting: Nurse Practitioner

## 2017-07-27 DIAGNOSIS — N390 Urinary tract infection, site not specified: Secondary | ICD-10-CM

## 2017-07-27 MED ORDER — NITROFURANTOIN MONOHYD MACRO 100 MG PO CAPS: 100.0000 mg | ORAL_CAPSULE | Freq: Two times a day (BID) | ORAL | 0 refills | Status: DC

## 2017-07-27 NOTE — Progress Notes (Signed)
Spoke to pt's caregiver and advised that rx sent to pharmacy for UTI.  Also gave lab results and advised to add OTC iron with stool softener.  dbs

## 2017-07-27 NOTE — Progress Notes (Signed)
Biggest issue with recent labs showing uti. I sent in rx for macrobid 100mg  twice daily for 7 days. Also slightly anemic. Should be taking otc iron supplment. Recheck labs at next visit.

## 2017-08-19 ENCOUNTER — Other Ambulatory Visit: Payer: Self-pay

## 2017-08-19 MED ORDER — HYDROCORTISONE 2.5 % RE CREA: 1.0000 "application " | TOPICAL_CREAM | Freq: Every day | RECTAL | 1 refills | Status: DC

## 2017-08-25 ENCOUNTER — Ambulatory Visit: Payer: Self-pay | Admitting: Nurse Practitioner

## 2017-09-02 ENCOUNTER — Encounter: Payer: Self-pay | Admitting: Internal Medicine

## 2017-09-02 ENCOUNTER — Ambulatory Visit (INDEPENDENT_AMBULATORY_CARE_PROVIDER_SITE_OTHER): Payer: Medicare Other | Admitting: Internal Medicine

## 2017-09-02 VITALS — BP 145/67 | HR 81 | Resp 16 | Ht 60.0 in | Wt 134.6 lb

## 2017-09-02 DIAGNOSIS — B369 Superficial mycosis, unspecified: Secondary | ICD-10-CM | POA: Diagnosis not present

## 2017-09-02 DIAGNOSIS — R300 Dysuria: Secondary | ICD-10-CM

## 2017-09-02 DIAGNOSIS — E538 Deficiency of other specified B group vitamins: Secondary | ICD-10-CM

## 2017-09-02 DIAGNOSIS — N390 Urinary tract infection, site not specified: Secondary | ICD-10-CM | POA: Diagnosis not present

## 2017-09-02 DIAGNOSIS — M25431 Effusion, right wrist: Secondary | ICD-10-CM | POA: Diagnosis not present

## 2017-09-02 DIAGNOSIS — M25531 Pain in right wrist: Secondary | ICD-10-CM

## 2017-09-02 DIAGNOSIS — R040 Epistaxis: Secondary | ICD-10-CM | POA: Diagnosis not present

## 2017-09-02 DIAGNOSIS — G20 Parkinson's disease: Secondary | ICD-10-CM

## 2017-09-02 DIAGNOSIS — R319 Hematuria, unspecified: Secondary | ICD-10-CM

## 2017-09-02 LAB — POCT URINALYSIS DIPSTICK
Glucose, UA: NEGATIVE
LEUKOCYTES UA: NEGATIVE
NITRITE UA: NEGATIVE
SPEC GRAV UA: 1.025 (ref 1.010–1.025)
Urobilinogen, UA: 0.2 E.U./dL
pH, UA: 5 (ref 5.0–8.0)

## 2017-09-02 MED ORDER — CYANOCOBALAMIN 1000 MCG/ML IJ SOLN
1000.0000 ug | Freq: Once | INTRAMUSCULAR | Status: AC
  Administered 2017-09-02: 1000 ug via INTRAMUSCULAR

## 2017-09-02 MED ORDER — ROPINIROLE HCL 0.25 MG PO TABS: ORAL_TABLET | ORAL | 3 refills | Status: DC

## 2017-09-02 MED ORDER — FLUCONAZOLE 150 MG PO TABS: 150.0000 mg | ORAL_TABLET | Freq: Once | ORAL | 0 refills | Status: AC

## 2017-09-02 MED ORDER — CLOTRIMAZOLE-BETAMETHASONE 1-0.05 % EX CREA: 1.0000 "application " | TOPICAL_CREAM | Freq: Two times a day (BID) | CUTANEOUS | 0 refills | Status: DC

## 2017-09-02 NOTE — Progress Notes (Signed)
[facility] [address] [place], [place] [address]  Internal MEDICINE  Office Visit Note  Patient Name: Jennifer Snyder  [id number]  [id number]  Date of Service: 09/17/2017  HPI  Pt is here for routine follow up.  Right hand swelling Nose bleed x2  Worsening parkinsonism on w/c Rash under the breast, not responding to treatment   Current Medication: Outpatient Encounter Medications as of 09/02/2017  Medication Sig  . acetaminophen (TYLENOL) 325 MG tablet Take by mouth.  Marland Kitchen aspirin 81 MG chewable tablet Chew by mouth.  . carbidopa-levodopa (SINEMET) 25-100 MG tablet Take 1 tab 8 times a day  . Carbidopa-Levodopa ER (SINEMET CR) 25-100 MG tablet controlled release TAKE 1 TABLET BY MOUTH EVERY DAY AT NIGHT  . clorazepate (TRANXENE) 3.75 MG tablet TAKE 1 TABLET BY MOUTH 1-3 TIMES DAILY  . clotrimazole (LOTRIMIN) 1 % cream APPLY TO AFFECTED AREA TWICE A DAY FOR FUNGAL RASH  . gabapentin (NEURONTIN) 100 MG capsule TAKE 1 CAPSULE BY MOUTH THREE TIMES A DAY  . hydrocortisone (ANUSOL-HC) 2.5 % rectal cream Place 1 application rectally daily. Apply to affected area daily for 2 weeks as needed for hemorrhoids  . methocarbamol (ROBAXIN) 750 MG tablet TAKE 1 TABLET BY MOUTH EVERY 6 HOURS AS NEEDED.  PATIENT NEEDS APPOINTMENT  . mometasone (ELOCON) 0.1 % cream APPLY TO AFFECTED AREA TWICE DAILY  . nitrofurantoin, macrocrystal-monohydrate, (MACROBID) 100 MG capsule Take 1 capsule (100 mg total) by mouth 2 (two) times daily. (Patient not taking: Reported on 09/17/2017)  . pantoprazole (PROTONIX) 40 MG tablet Take 40 mg by mouth.  . polyethylene glycol (MIRALAX / GLYCOLAX) packet Take by mouth.  . traMADol (ULTRAM) 50 MG tablet Take 50 mg by mouth every 6 (six) hours as needed. for pain  . [DISCONTINUED] metoprolol succinate (TOPROL-XL) 25 MG 24 hr tablet Take by mouth.  . clotrimazole-betamethasone (LOTRISONE) cream Apply 1 application topically 2 (two) times daily.  . [EXPIRED]  fluconazole (DIFLUCAN) 150 MG tablet Take 1 tablet (150 mg total) by mouth once for 1 dose.  Marland Kitchen rOPINIRole (REQUIP) 0.25 MG tablet One to 3 tabs a day as directed  . [DISCONTINUED] Carbidopa-Levodopa ER (SINEMET CR) 25-100 MG tablet controlled release Take by mouth.  . [EXPIRED] cyanocobalamin ((VITAMIN B-12)) injection 1,000 mcg    No facility-administered encounter medications on file as of 09/02/2017.     Surgical History: Past Surgical History:  Procedure Laterality Date  . ABDOMINAL HERNIA REPAIR    . ADENOIDECTOMY  1994  . CATARACT EXTRACTION, BILATERAL  09/2014, 07/2014  . child birth  58, 89  . CHOLECYSTECTOMY  1989  . laser surgery on both eyes  11/2016  . left knee replacement  1993  . para esophogeal hernia repair    . TONSILLECTOMY  1944  . VAGINAL HYSTERECTOMY  1976    Medical History: Past Medical History:  Diagnosis Date  . Hyperlipidemia   . Hypertension   . Osteoarthritis   . Parkinson's disease (HCC)   . Peptic ulcer disease     Family History: Family History  Problem Relation Age of Onset  . Lung cancer Mother   . Hyperlipidemia Mother   . Hypertension Mother   . Osteoarthritis Mother   . Stroke Mother     Social History   Socioeconomic History  . Marital status: Single    Spouse name: Not on file  . Number of children: Not on file  . Years of education: Not on file  . Highest education  level: Not on file  Social Needs  . Financial resource strain: Not on file  . Food insecurity - worry: Not on file  . Food insecurity - inability: Not on file  . Transportation needs - medical: Not on file  . Transportation needs - non-medical: Not on file  Occupational History  . Not on file  Tobacco Use  . Smoking status: Never Smoker  . Smokeless tobacco: Never Used  Substance and Sexual Activity  . Alcohol use: No    Frequency: Never  . Drug use: No  . Sexual activity: Not on file  Other Topics Concern  . Not on file  Social History  Narrative  . Not on file    Review of Systems  Constitutional: Negative for chills, diaphoresis and fatigue.  HENT: Negative for ear pain, postnasal drip and sinus pressure.   Respiratory: Negative for cough and shortness of breath.   Cardiovascular: Negative for chest pain, palpitations and leg swelling.  Gastrointestinal: Negative for abdominal pain, constipation, diarrhea, nausea and vomiting.  Genitourinary: Negative for dysuria and flank pain.  Musculoskeletal: Positive for back pain and joint swelling. Negative for arthralgias, gait problem and neck pain.  Skin: Positive for rash. Negative for color change.  Allergic/Immunologic: Negative for environmental allergies and food allergies.  Neurological: Positive for speech difficulty. Negative for dizziness and headaches.  Hematological: Does not bruise/bleed easily.  Psychiatric/Behavioral: Negative for agitation, behavioral problems (depression) and hallucinations.    Vital Signs: BP (!) 145/67   Pulse 81   Resp 16   Ht 5' (1.524 m)   Wt 134 lb 9.6 oz (61.1 kg)   SpO2 97%   BMI 26.29 kg/m    Physical Exam  Constitutional: She is oriented to person, place, and time. She appears well-developed and well-nourished. No distress.  HENT:  Head: Normocephalic and atraumatic.  Mouth/Throat: Oropharynx is clear and moist. No oropharyngeal exudate.  Eyes: EOM are normal. Pupils are equal, round, and reactive to light.  Neck: Normal range of motion. Neck supple. No JVD present. No tracheal deviation present. No thyromegaly present.  Cardiovascular: Normal rate, regular rhythm and normal heart sounds. Exam reveals no gallop and no friction rub.  No murmur heard. Pulmonary/Chest: Effort normal. No respiratory distress. She has no wheezes. She has no rales. She exhibits no tenderness.  Abdominal: Soft. Bowel sounds are normal.  Musculoskeletal: Normal range of motion. She exhibits edema.  Right wrist   Lymphadenopathy:    She has no  cervical adenopathy.  Neurological: She is alert and oriented to person, place, and time. No cranial nerve deficit.  Skin: Skin is warm and dry. She is not diaphoretic. There is erythema.  Psychiatric: She has a normal mood and affect. Her behavior is normal. Judgment and thought content normal.    Assessment/Plan: 1. Parkinson disease (HCC) - Ambulatory referral to Home Health for PT/OT - Wrist splint  2. B12 deficiency - Cyanocobalamin ((VITAMIN B-12)) injection 1,000 mcg  3. Dysuria - POCT Urinalysis Dipstick - CULTURE, URINE COMPREHENSIVE  4. Fungal dermatitis - Lotrison cream apply bid to the rash on her chest  5. Pain and swelling of right wrist - Wrist Splint  6. Epistaxis, recurrent - Saline spray prn   7. Hematuria, unspecified type - [place], retroperitnl abd,  Ltd at [employer].. Bladder u/s   Geneal Counseling: Adyline verbalizes understanding of the findings of todays visit and agrees with plan of treatment. I have discussed any further diagnostic evaluation that may be needed or ordered today.  We also reviewed her medications today. she has been encouraged to call the office with any questions or concerns that should arise related to todays visit.   Care giver in the room Pt has declining physical status due to PD    Meds ordered this encounter  Medications  . cyanocobalamin ((VITAMIN B-12)) injection 1,000 mcg  . rOPINIRole (REQUIP) 0.25 MG tablet    Sig: One to 3 tabs a day as directed    Dispense:  90 tablet    Refill:  3  . clotrimazole-betamethasone (LOTRISONE) cream    Sig: Apply 1 application topically 2 (two) times daily.    Dispense:  30 g    Refill:  0  . fluconazole (DIFLUCAN) 150 MG tablet    Sig: Take 1 tablet (150 mg total) by mouth once for 1 dose.    Dispense:  1 tablet    Refill:  0    Time spent: 5735 Minutes  Dr Lyndon CodeFozia M Khan Internal medicine

## 2017-09-04 ENCOUNTER — Encounter: Payer: Self-pay | Admitting: Internal Medicine

## 2017-09-04 ENCOUNTER — Telehealth: Payer: Self-pay

## 2017-09-04 NOTE — Telephone Encounter (Signed)
Pt called to ask about urine culture lab results. I called [facility] and the results are not in yet, they take about 4 days.  Called pt back to inform her of that.

## 2017-09-05 LAB — CULTURE, URINE COMPREHENSIVE

## 2017-09-07 NOTE — Progress Notes (Signed)
No Abnormal  bacterial growth

## 2017-09-08 ENCOUNTER — Other Ambulatory Visit: Payer: Self-pay

## 2017-09-09 ENCOUNTER — Other Ambulatory Visit: Payer: Self-pay

## 2017-09-09 ENCOUNTER — Other Ambulatory Visit: Payer: Self-pay | Admitting: Internal Medicine

## 2017-09-09 MED ORDER — METOPROLOL SUCCINATE ER 25 MG PO TB24: ORAL_TABLET | ORAL | 3 refills | Status: DC

## 2017-09-10 ENCOUNTER — Other Ambulatory Visit: Payer: Self-pay | Admitting: Internal Medicine

## 2017-09-10 DIAGNOSIS — R310 Gross hematuria: Secondary | ICD-10-CM

## 2017-09-14 ENCOUNTER — Telehealth: Payer: Self-pay

## 2017-09-14 ENCOUNTER — Other Ambulatory Visit: Payer: Self-pay

## 2017-09-14 NOTE — Telephone Encounter (Signed)
error 

## 2017-09-14 NOTE — Telephone Encounter (Signed)
Pt called requesting to switch back to monistat cream as opposed to the new rx for lotrisone.  Spoke with Dr. Beverely RisenFozia Khan and as per DFK I spoke to patient to let her know that lotrisone is the better option and since she just started using it then to continue for longer and if it doesn't get better she will need to see a dermatologist.

## 2017-09-14 NOTE — Telephone Encounter (Deleted)
Pt

## 2017-09-16 ENCOUNTER — Other Ambulatory Visit: Payer: Self-pay | Admitting: Internal Medicine

## 2017-09-17 ENCOUNTER — Other Ambulatory Visit: Payer: Self-pay

## 2017-09-17 ENCOUNTER — Encounter: Payer: Self-pay | Admitting: Emergency Medicine

## 2017-09-17 ENCOUNTER — Emergency Department
Admission: EM | Admit: 2017-09-17 | Discharge: 2017-09-17 | Disposition: A | Discharge status: Home / Self Care | Payer: Medicare Other | Attending: Student in an Organized Health Care Education/Training Program | Admitting: Student in an Organized Health Care Education/Training Program

## 2017-09-17 ENCOUNTER — Emergency Department: Payer: Medicare Other

## 2017-09-17 ENCOUNTER — Encounter: Payer: Self-pay | Admitting: Internal Medicine

## 2017-09-17 DIAGNOSIS — R040 Epistaxis: Secondary | ICD-10-CM | POA: Diagnosis not present

## 2017-09-17 DIAGNOSIS — Z96652 Presence of left artificial knee joint: Secondary | ICD-10-CM | POA: Diagnosis not present

## 2017-09-17 DIAGNOSIS — Z7982 Long term (current) use of aspirin: Secondary | ICD-10-CM | POA: Diagnosis not present

## 2017-09-17 DIAGNOSIS — G20 Parkinson's disease: Secondary | ICD-10-CM | POA: Insufficient documentation

## 2017-09-17 DIAGNOSIS — K649 Unspecified hemorrhoids: Secondary | ICD-10-CM | POA: Diagnosis not present

## 2017-09-17 DIAGNOSIS — Z79899 Other long term (current) drug therapy: Secondary | ICD-10-CM | POA: Diagnosis not present

## 2017-09-17 DIAGNOSIS — M25552 Pain in left hip: Secondary | ICD-10-CM | POA: Diagnosis not present

## 2017-09-17 DIAGNOSIS — R269 Unspecified abnormalities of gait and mobility: Secondary | ICD-10-CM | POA: Diagnosis not present

## 2017-09-17 DIAGNOSIS — I10 Essential (primary) hypertension: Secondary | ICD-10-CM | POA: Diagnosis not present

## 2017-09-17 DIAGNOSIS — G8929 Other chronic pain: Secondary | ICD-10-CM | POA: Insufficient documentation

## 2017-09-17 DIAGNOSIS — Z743 Need for continuous supervision: Secondary | ICD-10-CM | POA: Diagnosis not present

## 2017-09-17 MED ORDER — LIDOCAINE HCL (PF) 1 % IJ SOLN
5.0000 mL | Freq: Once | INTRAMUSCULAR | Status: AC
  Administered 2017-09-17: 5 mL via INTRADERMAL
  Filled 2017-09-17: qty 5

## 2017-09-17 MED ORDER — OXYMETAZOLINE HCL 0.05 % NA SOLN
1.0000 | Freq: Once | NASAL | Status: AC
  Administered 2017-09-17: 1 via NASAL
  Filled 2017-09-17: qty 15

## 2017-09-17 MED ORDER — DIBUCAINE 1 % RE OINT
1.0000 | TOPICAL_OINTMENT | RECTAL | 0 refills | Status: DC | PRN
Start: 2017-09-17 — End: 2018-11-11

## 2017-09-17 MED ORDER — SILVER NITRATE-POT NITRATE 75-25 % EX MISC
1.0000 "application " | Freq: Once | CUTANEOUS | Status: AC
  Administered 2017-09-17: 1 via TOPICAL
  Filled 2017-09-17: qty 1

## 2017-09-17 MED ORDER — LIDOCAINE 5 % EX PTCH: 1.0000 | MEDICATED_PATCH | Freq: Two times a day (BID) | CUTANEOUS | 0 refills | Status: AC

## 2017-09-17 NOTE — ED Notes (Signed)
Pt given crackers and ginger ale to drink

## 2017-09-17 NOTE — Telephone Encounter (Signed)
Please send her med she was last her 1/19

## 2017-09-17 NOTE — ED Triage Notes (Signed)
Pt to ed from home via [facility] started at 6am. Also c/o left leg pain. Pt can stand with assist. Pt is not on any bp meds at this time and ems reports HTN 185/158, p107, CBG 192. Ems est 20guage in the left wrist. Pt a/ox3 on arrival to ed . edp at bedside.

## 2017-09-17 NOTE — ED Provider Notes (Addendum)
[facility] Emergency Department Provider Note    First MD Initiated Contact with Patient 09/17/17 0825     (approximate)  I have reviewed the triage vital signs and the nursing notes.   HISTORY  Chief Complaint Epistaxis and Leg Pain    HPI Jennifer Snyder is a 82 y.o. female history of Parkinson's disease plus hypertension presents to the ER for evaluation of nosebleed that started at 6 AM.  Patient was also complaining of left leg pain is been going on for several days.  Mild to moderate and constant in nature.  Nosebleed did stop spontaneously.  It occurred while she was using the bathroom states that she was straining to move her bowels.  She not on any blood thinners.  Denies any chest pain or shortness of breath.  No fevers.  No trauma.  Past Medical History:  Diagnosis Date  . Hyperlipidemia   . Hypertension   . Osteoarthritis   . Parkinson's disease (HCC)   . Peptic ulcer disease    Family History  Problem Relation Age of Onset  . Lung cancer Mother   . Hyperlipidemia Mother   . Hypertension Mother   . Osteoarthritis Mother   . Stroke Mother    Past Surgical History:  Procedure Laterality Date  . ABDOMINAL HERNIA REPAIR    . ADENOIDECTOMY  1994  . CATARACT EXTRACTION, BILATERAL  09/2014, 07/2014  . child birth  7, [birth year]  . CHOLECYSTECTOMY  1989  . laser surgery on both eyes  11/2016  . left knee replacement  1993  . para esophogeal hernia repair    . TONSILLECTOMY  1944  . VAGINAL HYSTERECTOMY  1976   There are no active problems to display for this patient.     Prior to Admission medications   Medication Sig Start Date End Date Taking? Authorizing Provider  acetaminophen (TYLENOL) 325 MG tablet Take by mouth.    [provider]  aspirin 81 MG chewable tablet Chew by mouth.    [provider]  carbidopa-levodopa (SINEMET) 25-100 MG tablet Take 1 tab 8 times a day 07/07/17   [provider]    Carbidopa-Levodopa ER (SINEMET CR) 25-100 MG tablet controlled release TAKE 1 TABLET BY MOUTH EVERY DAY AT NIGHT 06/08/17   [provider]  clorazepate (TRANXENE) 3.75 MG tablet TAKE 1 TABLET BY MOUTH 1-3 TIMES DAILY 06/18/17   [provider]  clotrimazole (LOTRIMIN) 1 % cream APPLY TO AFFECTED AREA TWICE A DAY FOR FUNGAL RASH 06/17/17   [provider]  clotrimazole-betamethasone (LOTRISONE) cream Apply 1 application topically 2 (two) times daily. 09/02/17   Lyndon Code, MD  gabapentin (NEURONTIN) 100 MG capsule TAKE 1 CAPSULE BY MOUTH THREE TIMES A DAY 08/05/17   [provider]  hydrocortisone (ANUSOL-HC) 2.5 % rectal cream Place 1 application rectally daily. Apply to affected area daily for 2 weeks as needed for hemorrhoids 08/19/17   Carlean Jews, NP  methocarbamol (ROBAXIN) 750 MG tablet TAKE 1 TABLET BY MOUTH EVERY 6 HOURS AS NEEDED.  PATIENT NEEDS APPOINTMENT 03/05/16   [provider]  metoprolol succinate (TOPROL-XL) 25 MG 24 hr tablet Start with half tab a day and may increase to one 09/09/17   Lyndon Code, MD  mometasone (ELOCON) 0.1 % cream APPLY TO AFFECTED AREA TWICE DAILY 05/27/17   [provider]  nitrofurantoin, macrocrystal-monohydrate, (MACROBID) 100 MG capsule Take 1 capsule (100 mg total) by mouth 2 (two) times daily.  Patient not taking: Reported on 09/17/2017 07/27/17   Carlean JewsBoscia, Heather E, NP  pantoprazole (PROTONIX) 40 MG tablet Take 40 mg by mouth. 11/21/14   [provider]  polyethylene glycol (MIRALAX / GLYCOLAX) packet Take by mouth.    [provider]  rOPINIRole (REQUIP) 0.25 MG tablet One to 3 tabs a day as directed 09/02/17   Lyndon CodeKhan, Fozia M, MD  traMADol (ULTRAM) 50 MG tablet Take 50 mg by mouth every 6 (six) hours as needed. for pain 07/30/17   [provider]    Allergies Codeine    Social History Social History   Tobacco Use  . Smoking status: Never Smoker  . Smokeless  tobacco: Never Used  Substance Use Topics  . Alcohol use: No    Frequency: Never  . Drug use: No    Review of Systems Patient denies headaches, rhinorrhea, blurry vision, numbness, shortness of breath, chest pain, edema, cough, abdominal pain, nausea, vomiting, diarrhea, dysuria, fevers, rashes or hallucinations unless otherwise stated above in HPI. ____________________________________________   PHYSICAL EXAM:  VITAL SIGNS: Vitals:   09/17/17 0830  BP: (!) 214/111  Pulse: (!) 105  Resp: 20  Temp: 98.4 F (36.9 C)  SpO2: 97%    Constitutional: Alert and oriented. Well appearing and in no acute distress. Eyes: Conjunctivae are normal.  Head: Atraumatic. Nose: No congestion/rhinnorhea.  No evidence of posterior bleed, small polyp in left anterior septum, Mouth/Throat: Mucous membranes are moist.   Neck: No stridor. Painless ROM.  Cardiovascular: Normal rate, regular rhythm. Grossly normal heart sounds.  Good peripheral circulation. Respiratory: Normal respiratory effort.  No retractions. Lungs CTAB. Gastrointestinal: Soft and nontender. No distention. No abdominal bruits. No CVA tenderness. Genitourinary: Several nonthrombosed nontender external hemorrhoids.  No evidence of rectal prolapse or stricture Musculoskeletal mild ttp of left hip without rotational deformity No joint effusions. Neurologic:  Normal speech and language. No gross focal neurologic deficits are appreciated. No facial droop Skin:  Skin is warm, dry and intact. No rash noted. Psychiatric: Mood and affect are normal. Speech and behavior are normal.  ____________________________________________   LABS (all labs ordered are listed, but only abnormal results are displayed)  No results found for this or any previous visit (from the past 24 hour(s)). ____________________________________________  ____________________________________________  RADIOLOGY  I personally reviewed all radiographic images ordered  to evaluate for the above acute complaints and reviewed radiology reports and findings.  These findings were personally discussed with the patient.  Please see medical record for radiology report.  ____________________________________________   PROCEDURES  Procedure(s) performed:  .Epistaxis Management Date/Time: 09/17/2017 9:27 AM Performed by: Willy Eddyobinson, Anel Creighton, MD Authorized by: Willy Eddyobinson, Samaria Anes, MD   Consent:    Consent obtained:  Verbal   Consent given by:  Patient   Risks discussed:  Bleeding and nasal injury Anesthesia (see MAR for exact dosages):    Anesthesia method:  Topical application   Topical anesthesia: see mar. Procedure details:    Treatment site:  L anterior   Treatment method:  Silver nitrate   Treatment complexity:  Limited   Treatment episode: initial   Post-procedure details:    Assessment:  Bleeding stopped   Patient tolerance of procedure:  Tolerated well, no immediate complications      Critical Care performed: no ____________________________________________   INITIAL IMPRESSION / ASSESSMENT AND PLAN / ED COURSE  Pertinent labs & imaging results that were available during my care of the patient were reviewed by me and considered in my medical decision making (  see chart for details).  DDX: anterior nose bleed, posterior bleed, fracture, contusion, dislocation, arthritis  Jennifer Snyder is a 82 y.o. who presents to the ED with anterior epistaxis but also wanting to have her left hip pain evaluated.  No evidence of fracture.  Epistaxis controlled as described above.  Patient stable and appropriate for follow-up with ear nose and throat.  She is not on any anticoagulation.  No evidence of posterior bleed.  BP improved with taking AM meds.  Have discussed with the patient and available family all diagnostics and treatments performed thus far and all questions were answered to the best of my ability. The patient demonstrates understanding and agreement  with plan.       ____________________________________________   FINAL CLINICAL IMPRESSION(S) / ED DIAGNOSES  Final diagnoses:  Anterior epistaxis  Chronic left hip pain  Hemorrhoids, unspecified hemorrhoid type      NEW MEDICATIONS STARTED DURING THIS VISIT:  New Prescriptions   No medications on file     Note:  This document was prepared using Dragon voice recognition software and may include unintentional dictation errors.    Willy Eddy, MD 09/17/17 1610    Willy Eddy, MD 09/17/17 [phone]

## 2017-09-17 NOTE — Care Management Note (Addendum)
Case Management Note  Patient Details  Name: Jennifer Snyder MRN: [id number] Date of Birth: 10-22-1935  Subjective/Objective:   [facility] talking to family and patient in room.Cristie Ray accepted the referral and will call the verified phone number.     The face to face has already been completed by the MD.            Action/Plan:   Expected Discharge Date:                  Expected Discharge Plan:     In-House Referral:     Discharge planning Services     Post Acute Care Choice:    Choice offered to:     DME Arranged:    DME Agency:     HH Arranged:    HH Agency:     Status of Service:     If discussed at [employer] Meetings, dates discussed:    Additional Comments:  Berna BueCheryl Remy Dia, RN 09/17/2017, 9:55 AM

## 2017-09-17 NOTE — Discharge Instructions (Signed)
Please follow-up with your primary care physician.  I do recommend taking a small amount of Vaseline and using a soft Q-tip apply just to the bottom of your nose to help moisten the septum and nares to prevent additional nosebleeds.

## 2017-09-18 ENCOUNTER — Other Ambulatory Visit: Payer: Self-pay | Admitting: Internal Medicine

## 2017-09-18 DIAGNOSIS — M25552 Pain in left hip: Secondary | ICD-10-CM | POA: Diagnosis not present

## 2017-09-18 DIAGNOSIS — G20 Parkinson's disease: Secondary | ICD-10-CM | POA: Diagnosis not present

## 2017-09-23 ENCOUNTER — Other Ambulatory Visit: Payer: Self-pay

## 2017-09-23 ENCOUNTER — Other Ambulatory Visit: Payer: Self-pay | Admitting: Internal Medicine

## 2017-09-23 DIAGNOSIS — G20 Parkinson's disease: Secondary | ICD-10-CM | POA: Diagnosis not present

## 2017-09-23 DIAGNOSIS — M25552 Pain in left hip: Secondary | ICD-10-CM | POA: Diagnosis not present

## 2017-09-25 DIAGNOSIS — M25552 Pain in left hip: Secondary | ICD-10-CM | POA: Diagnosis not present

## 2017-09-25 DIAGNOSIS — G20 Parkinson's disease: Secondary | ICD-10-CM | POA: Diagnosis not present

## 2017-09-28 ENCOUNTER — Other Ambulatory Visit: Payer: Self-pay

## 2017-09-29 ENCOUNTER — Telehealth: Payer: Self-pay

## 2017-09-29 DIAGNOSIS — G20 Parkinson's disease: Secondary | ICD-10-CM | POA: Diagnosis not present

## 2017-09-29 DIAGNOSIS — M25552 Pain in left hip: Secondary | ICD-10-CM | POA: Diagnosis not present

## 2017-09-29 NOTE — Telephone Encounter (Signed)
Lemeul from [facility] called asking for PT orders for 1 x wk for 1 wk, 2 x wk for 3 wks, and 1 x wk for 1 wk.  I gave him the ok for the orders.  dbs

## 2017-10-01 ENCOUNTER — Telehealth: Payer: Self-pay

## 2017-10-01 ENCOUNTER — Other Ambulatory Visit: Payer: Self-pay

## 2017-10-01 NOTE — Telephone Encounter (Signed)
PHYSICAL THERAPY  LEMU[facility] CALLED [phone] THAT [place] REFUSED FOR PHYSICAL THERAPY TODAY BECAUSE SHE NOT FEELING WELL AND TOMORROW BECAUSE SHE HAD DR APPT

## 2017-10-06 ENCOUNTER — Telehealth: Payer: Self-pay | Admitting: Internal Medicine

## 2017-10-06 DIAGNOSIS — M25552 Pain in left hip: Secondary | ICD-10-CM | POA: Diagnosis not present

## 2017-10-06 DIAGNOSIS — G20 Parkinson's disease: Secondary | ICD-10-CM | POA: Diagnosis not present

## 2017-10-06 NOTE — Telephone Encounter (Signed)
SIGNED [facility] C[facility] AND PUT INTO [email or web address]

## 2017-10-08 DIAGNOSIS — G20 Parkinson's disease: Secondary | ICD-10-CM | POA: Diagnosis not present

## 2017-10-08 DIAGNOSIS — M25552 Pain in left hip: Secondary | ICD-10-CM | POA: Diagnosis not present

## 2017-10-17 DIAGNOSIS — G20 Parkinson's disease: Secondary | ICD-10-CM | POA: Diagnosis not present

## 2017-10-17 DIAGNOSIS — M25552 Pain in left hip: Secondary | ICD-10-CM | POA: Diagnosis not present

## 2017-10-19 DIAGNOSIS — M25552 Pain in left hip: Secondary | ICD-10-CM | POA: Diagnosis not present

## 2017-10-19 DIAGNOSIS — G20 Parkinson's disease: Secondary | ICD-10-CM | POA: Diagnosis not present

## 2017-10-23 ENCOUNTER — Other Ambulatory Visit: Payer: Self-pay

## 2017-10-23 NOTE — Progress Notes (Signed)
[facility], pt is taking allergy relief and sent request to update our medication records.  dbs

## 2017-11-10 DIAGNOSIS — G5600 Carpal tunnel syndrome, unspecified upper limb: Secondary | ICD-10-CM | POA: Diagnosis not present

## 2017-11-10 DIAGNOSIS — G20 Parkinson's disease: Secondary | ICD-10-CM | POA: Diagnosis not present

## 2017-11-10 DIAGNOSIS — Z8673 Personal history of transient ischemic attack (TIA), and cerebral infarction without residual deficits: Secondary | ICD-10-CM | POA: Diagnosis not present

## 2017-11-23 ENCOUNTER — Other Ambulatory Visit: Payer: Self-pay

## 2017-11-23 MED ORDER — CLOTRIMAZOLE-BETAMETHASONE 1-0.05 % EX CREA: TOPICAL_CREAM | Freq: Two times a day (BID) | CUTANEOUS | 0 refills | Status: DC

## 2017-12-04 ENCOUNTER — Telehealth: Payer: Self-pay

## 2017-12-04 NOTE — Telephone Encounter (Signed)
FAXED SIGNED CMN ON 12-04-17

## 2017-12-07 ENCOUNTER — Encounter: Payer: Self-pay | Admitting: Nurse Practitioner

## 2017-12-07 ENCOUNTER — Ambulatory Visit (INDEPENDENT_AMBULATORY_CARE_PROVIDER_SITE_OTHER): Payer: Medicare Other | Admitting: Nurse Practitioner

## 2017-12-07 VITALS — BP 130/80 | HR 89 | Resp 16 | Ht 60.0 in | Wt 138.0 lb

## 2017-12-07 DIAGNOSIS — I10 Essential (primary) hypertension: Secondary | ICD-10-CM

## 2017-12-07 DIAGNOSIS — E538 Deficiency of other specified B group vitamins: Secondary | ICD-10-CM | POA: Diagnosis not present

## 2017-12-07 DIAGNOSIS — M62838 Other muscle spasm: Secondary | ICD-10-CM

## 2017-12-07 DIAGNOSIS — G20 Parkinson's disease: Secondary | ICD-10-CM

## 2017-12-07 MED ORDER — METHOCARBAMOL 750 MG PO TABS: 750.0000 mg | ORAL_TABLET | Freq: Four times a day (QID) | ORAL | 2 refills | Status: DC | PRN

## 2017-12-07 MED ORDER — CYANOCOBALAMIN 1000 MCG/ML IJ SOLN
1000.0000 ug | Freq: Once | INTRAMUSCULAR | Status: AC
  Administered 2017-12-07: 1000 ug via INTRAMUSCULAR

## 2017-12-07 NOTE — Progress Notes (Signed)
[facility] [address] [place], [place] [address]  Internal MEDICINE  Office Visit Note  Patient Name: Jennifer Snyder  [id number]  [id number]  Date of Service: 12/28/2017  Pt is here for routine follow up.   Chief Complaint  Patient presents with  . Fatigue    especially at night    The patient is here with caregiver. States that weakness is getting worse, especially in the evenings. So weak, she can hardly pull herself up. Has added back Sinemet ER at night. Patient reports feeling more alert after a short nap when she takes this medication.       Current Medication: Outpatient Encounter Medications as of 12/07/2017  Medication Sig  . acetaminophen (TYLENOL) 325 MG tablet Take by mouth.  Marland Kitchen aspirin 81 MG chewable tablet Chew by mouth.  . carbidopa-levodopa (SINEMET) 25-100 MG tablet Take 1 tab 8 times a day  . Carbidopa-Levodopa ER (SINEMET CR) 25-100 MG tablet controlled release TAKE 1 TABLET BY MOUTH EVERY DAY AT NIGHT  . clotrimazole (LOTRIMIN) 1 % cream APPLY TO AFFECTED AREA TWICE A DAY FOR FUNGAL RASH  . clotrimazole-betamethasone (LOTRISONE) cream Apply topically 2 (two) times daily.  . dibucaine (NUPERCAINAL) 1 % OINT Place 1 application rectally as needed for hemorrhoids.  Marland Kitchen gabapentin (NEURONTIN) 100 MG capsule TAKE 1 CAPSULE BY MOUTH THREE TIMES A DAY  . hydrocortisone (ANUSOL-HC) 2.5 % rectal cream Place 1 application rectally daily. Apply to affected area daily for 2 weeks as needed for hemorrhoids  . lidocaine (LIDODERM) 5 % Place 1 patch onto the skin every 12 (twelve) hours. Remove & Discard patch within 12 hours or as directed by MD  . loratadine (ALLERGY RELIEF) 10 MG tablet Take 10 mg by mouth daily.  . methocarbamol (ROBAXIN) 750 MG tablet Take 1 tablet (750 mg total) by mouth every 6 (six) hours as needed for muscle spasms.  . metoprolol succinate (TOPROL-XL) 25 MG 24 hr tablet Start with half tab a day and may increase to one  . mometasone  (ELOCON) 0.1 % cream APPLY TO AFFECTED AREA TWICE DAILY  . nitrofurantoin, macrocrystal-monohydrate, (MACROBID) 100 MG capsule Take 1 capsule (100 mg total) by mouth 2 (two) times daily. (Patient not taking: Reported on 09/17/2017)  . pantoprazole (PROTONIX) 40 MG tablet Take 40 mg by mouth.  . polyethylene glycol (MIRALAX / GLYCOLAX) packet Take by mouth.  . traMADol (ULTRAM) 50 MG tablet Take 50 mg by mouth every 6 (six) hours as needed. for pain  . [DISCONTINUED] clorazepate (TRANXENE) 3.75 MG tablet TAKE 1 TABLET BY MOUTH 1-3 TIMES DAILY  . [DISCONTINUED] methocarbamol (ROBAXIN) 750 MG tablet TAKE 1 TABLET BY MOUTH EVERY 6 HOURS AS NEEDED.  PATIENT NEEDS APPOINTMENT  . [DISCONTINUED] rOPINIRole (REQUIP) 0.25 MG tablet One to 3 tabs a day as directed  . [EXPIRED] cyanocobalamin ((VITAMIN B-12)) injection 1,000 mcg    No facility-administered encounter medications on file as of 12/07/2017.     Surgical History: Past Surgical History:  Procedure Laterality Date  . ABDOMINAL HERNIA REPAIR    . ADENOIDECTOMY  1994  . CATARACT EXTRACTION, BILATERAL  09/2014, 07/2014  . child birth  23, 64  . CHOLECYSTECTOMY  1989  . laser surgery on both eyes  11/2016  . left knee replacement  1993  . para esophogeal hernia repair    . TONSILLECTOMY  1944  . VAGINAL HYSTERECTOMY  1976    Medical History: Past Medical History:  Diagnosis Date  . Hyperlipidemia   .  Hypertension   . Osteoarthritis   . Parkinson's disease (HCC)   . Peptic ulcer disease     Family History: Family History  Problem Relation Age of Onset  . Lung cancer Mother   . Hyperlipidemia Mother   . Hypertension Mother   . Osteoarthritis Mother   . Stroke Mother     Social History   Socioeconomic History  . Marital status: Single    Spouse name: Not on file  . Number of children: Not on file  . Years of education: Not on file  . Highest education level: Not on file  Occupational History  . Not on file  Social  Needs  . Financial resource strain: Not on file  . Food insecurity:    Worry: Not on file    Inability: Not on file  . Transportation needs:    Medical: Not on file    Non-medical: Not on file  Tobacco Use  . Smoking status: Never Smoker  . Smokeless tobacco: Never Used  Substance and Sexual Activity  . Alcohol use: No    Frequency: Never  . Drug use: No  . Sexual activity: Not on file  Lifestyle  . Physical activity:    Days per week: Not on file    Minutes per session: Not on file  . Stress: Not on file  Relationships  . Social connections:    Talks on phone: Not on file    Gets together: Not on file    Attends religious service: Not on file    Active member of club or organization: Not on file    Attends meetings of clubs or organizations: Not on file    Relationship status: Not on file  . Intimate partner violence:    Fear of current or ex partner: Not on file    Emotionally abused: Not on file    Physically abused: Not on file    Forced sexual activity: Not on file  Other Topics Concern  . Not on file  Social History Narrative  . Not on file      Review of Systems  Constitutional: Positive for fatigue. Negative for activity change, chills and diaphoresis.  HENT: Negative for ear pain, postnasal drip and sinus pressure.   Eyes: Negative.   Respiratory: Negative for cough, shortness of breath and wheezing.   Cardiovascular: Negative for chest pain, palpitations and leg swelling.  Gastrointestinal: Negative for abdominal pain, constipation, diarrhea, nausea and vomiting.  Endocrine: Negative for cold intolerance, heat intolerance, polydipsia, polyphagia and polyuria.  Genitourinary: Negative for dysuria and flank pain.  Musculoskeletal: Positive for back pain and joint swelling. Negative for arthralgias, gait problem and neck pain.  Skin: Negative for color change and rash.  Allergic/Immunologic: Negative for environmental allergies and food allergies.   Neurological: Positive for tremors, speech difficulty and weakness. Negative for dizziness and headaches.  Hematological: Does not bruise/bleed easily.  Psychiatric/Behavioral: Positive for sleep disturbance. Negative for agitation, behavioral problems (depression) and hallucinations. The patient is nervous/anxious.     Today's Vitals   12/07/17 1145  BP: 130/80  Pulse: 89  Resp: 16  SpO2: 96%  Weight: 138 lb (62.6 kg)  Height: 5' (1.524 m)    Physical Exam  Constitutional: She is oriented to person, place, and time. She appears well-developed and well-nourished. No distress.  HENT:  Head: Normocephalic and atraumatic.  Mouth/Throat: Oropharynx is clear and moist. No oropharyngeal exudate.  Eyes: Pupils are equal, round, and reactive to light. Conjunctivae and  EOM are normal.  Neck: Normal range of motion. Neck supple. No JVD present. No tracheal deviation present. No thyromegaly present.  Cardiovascular: Normal rate, regular rhythm and normal heart sounds. Exam reveals no gallop and no friction rub.  No murmur heard. Pulmonary/Chest: Effort normal and breath sounds normal. No respiratory distress. She has no wheezes. She has no rales. She exhibits no tenderness.  Abdominal: Soft. Bowel sounds are normal. There is no tenderness.  Musculoskeletal: Normal range of motion. She exhibits no edema.  Lymphadenopathy:    She has no cervical adenopathy.  Neurological: She is alert and oriented to person, place, and time. No cranial nerve deficit. Coordination abnormal.  Patient is at her neurological baseline.   Skin: Skin is warm and dry. Capillary refill takes 2 to 3 seconds. She is not diaphoretic. No erythema.  Psychiatric: Her speech is normal and behavior is normal. Judgment and thought content normal. Her mood appears anxious. Cognition and memory are normal. She exhibits a depressed mood.  Nursing note and vitals reviewed.  Assessment/Plan: 1. Muscle spasticity May continue to  take robaxin every 6 hours as needed for muscle spasms. New prescription sent to pharmacy today.  - methocarbamol (ROBAXIN) 750 MG tablet; Take 1 tablet (750 mg total) by mouth every 6 (six) hours as needed for muscle spasms.  Dispense: 120 tablet; Refill: 2  2. B12 deficiency b12 injection provided today.  - cyanocobalamin ((VITAMIN B-12)) injection 1,000 mcg  3. Parkinson disease (HCC) Continue regular visits with neurology and current dosing of sinemet as prescribed.   4. Essential hypertension Stable. Continue bp medication as prescribed   General Counseling: Javia verbalizes understanding of the findings of todays visit and agrees with plan of treatment. I have discussed any further diagnostic evaluation that may be needed or ordered today. We also reviewed her medications today. she has been encouraged to call the office with any questions or concerns that should arise related to todays visit.  This patient was seen by Vincent Gros, FNP- C in Collaboration with Dr Lyndon Code as a part of collaborative care agreement    Meds ordered this encounter  Medications  . methocarbamol (ROBAXIN) 750 MG tablet    Sig: Take 1 tablet (750 mg total) by mouth every 6 (six) hours as needed for muscle spasms.    Dispense:  120 tablet    Refill:  2    Order Specific Question:   Supervising Provider    Answer:   Lyndon Code [[id number]]  . cyanocobalamin ((VITAMIN B-12)) injection 1,000 mcg    Time spent: 41 Minutes     Dr Lyndon Code Internal medicine

## 2017-12-25 ENCOUNTER — Other Ambulatory Visit: Payer: Self-pay | Admitting: Internal Medicine

## 2017-12-25 MED ORDER — CLORAZEPATE DIPOTASSIUM 3.75 MG PO TABS: ORAL_TABLET | ORAL | 1 refills | Status: DC

## 2017-12-28 DIAGNOSIS — G20 Parkinson's disease: Secondary | ICD-10-CM | POA: Insufficient documentation

## 2017-12-28 DIAGNOSIS — E538 Deficiency of other specified B group vitamins: Secondary | ICD-10-CM | POA: Insufficient documentation

## 2017-12-28 DIAGNOSIS — G20A1 Parkinson's disease without dyskinesia, without mention of fluctuations: Secondary | ICD-10-CM | POA: Insufficient documentation

## 2017-12-28 DIAGNOSIS — I10 Essential (primary) hypertension: Secondary | ICD-10-CM | POA: Insufficient documentation

## 2017-12-28 DIAGNOSIS — M62838 Other muscle spasm: Secondary | ICD-10-CM | POA: Insufficient documentation

## 2017-12-30 ENCOUNTER — Other Ambulatory Visit: Payer: Self-pay | Admitting: Internal Medicine

## 2017-12-30 MED ORDER — CLOTRIMAZOLE-BETAMETHASONE 1-0.05 % EX CREA: TOPICAL_CREAM | Freq: Two times a day (BID) | CUTANEOUS | 5 refills | Status: DC

## 2018-01-01 ENCOUNTER — Other Ambulatory Visit: Payer: Self-pay | Admitting: Internal Medicine

## 2018-01-12 DIAGNOSIS — Z8673 Personal history of transient ischemic attack (TIA), and cerebral infarction without residual deficits: Secondary | ICD-10-CM | POA: Diagnosis not present

## 2018-01-12 DIAGNOSIS — G20 Parkinson's disease: Secondary | ICD-10-CM | POA: Diagnosis not present

## 2018-01-12 DIAGNOSIS — R350 Frequency of micturition: Secondary | ICD-10-CM | POA: Diagnosis not present

## 2018-01-12 DIAGNOSIS — G5600 Carpal tunnel syndrome, unspecified upper limb: Secondary | ICD-10-CM | POA: Diagnosis not present

## 2018-01-15 DIAGNOSIS — H1032 Unspecified acute conjunctivitis, left eye: Secondary | ICD-10-CM | POA: Diagnosis not present

## 2018-01-18 ENCOUNTER — Other Ambulatory Visit: Payer: Self-pay | Admitting: Internal Medicine

## 2018-01-18 MED ORDER — TRAMADOL HCL 50 MG PO TABS: 50.0000 mg | ORAL_TABLET | Freq: Four times a day (QID) | ORAL | 3 refills | Status: DC | PRN

## 2018-01-26 ENCOUNTER — Telehealth: Payer: Self-pay | Admitting: Nurse Practitioner

## 2018-01-26 MED ORDER — OXYBUTYNIN CHLORIDE 5 MG PO TABS: 5.0000 mg | ORAL_TABLET | Freq: Every day | ORAL | 1 refills | Status: DC

## 2018-01-26 NOTE — Telephone Encounter (Signed)
PT CALLED AND STATES THAT SHE CAN NOT HOLD HER URINE WOULD LIKE A RX CALLED IN TO [facility]

## 2018-03-15 ENCOUNTER — Other Ambulatory Visit: Payer: Self-pay | Admitting: Nurse Practitioner

## 2018-03-15 DIAGNOSIS — M62838 Other muscle spasm: Secondary | ICD-10-CM

## 2018-03-15 MED ORDER — METHOCARBAMOL 750 MG PO TABS: 750.0000 mg | ORAL_TABLET | Freq: Four times a day (QID) | ORAL | 2 refills | Status: DC | PRN

## 2018-03-16 ENCOUNTER — Other Ambulatory Visit: Payer: Self-pay | Admitting: Nurse Practitioner

## 2018-03-16 MED ORDER — OXYBUTYNIN CHLORIDE 5 MG PO TABS: 5.0000 mg | ORAL_TABLET | Freq: Every day | ORAL | 1 refills | Status: DC

## 2018-03-23 ENCOUNTER — Encounter: Payer: Self-pay | Admitting: Nurse Practitioner

## 2018-03-24 ENCOUNTER — Ambulatory Visit (INDEPENDENT_AMBULATORY_CARE_PROVIDER_SITE_OTHER): Payer: Medicare Other | Admitting: Nurse Practitioner

## 2018-03-24 ENCOUNTER — Encounter: Payer: Self-pay | Admitting: Nurse Practitioner

## 2018-03-24 VITALS — BP 146/71 | HR 78 | Resp 16 | Ht 60.0 in | Wt 131.6 lb

## 2018-03-24 DIAGNOSIS — N390 Urinary tract infection, site not specified: Secondary | ICD-10-CM

## 2018-03-24 DIAGNOSIS — E538 Deficiency of other specified B group vitamins: Secondary | ICD-10-CM

## 2018-03-24 DIAGNOSIS — Z0001 Encounter for general adult medical examination with abnormal findings: Secondary | ICD-10-CM | POA: Diagnosis not present

## 2018-03-24 DIAGNOSIS — R300 Dysuria: Secondary | ICD-10-CM | POA: Diagnosis not present

## 2018-03-24 DIAGNOSIS — I10 Essential (primary) hypertension: Secondary | ICD-10-CM

## 2018-03-24 DIAGNOSIS — G20 Parkinson's disease: Secondary | ICD-10-CM

## 2018-03-24 DIAGNOSIS — H6063 Unspecified chronic otitis externa, bilateral: Secondary | ICD-10-CM

## 2018-03-24 DIAGNOSIS — G20A1 Parkinson's disease without dyskinesia, without mention of fluctuations: Secondary | ICD-10-CM

## 2018-03-24 LAB — POCT URINALYSIS DIPSTICK
Bilirubin, UA: NEGATIVE
Blood, UA: NEGATIVE
GLUCOSE UA: NEGATIVE
NITRITE UA: NEGATIVE
PROTEIN UA: POSITIVE — AB
Spec Grav, UA: 1.01 (ref 1.010–1.025)
Urobilinogen, UA: 0.2 E.U./dL
pH, UA: 5 (ref 5.0–8.0)

## 2018-03-24 MED ORDER — CYANOCOBALAMIN 1000 MCG/ML IJ SOLN
1000.0000 ug | Freq: Once | INTRAMUSCULAR | Status: AC
  Administered 2018-03-24: 1000 ug via INTRAMUSCULAR

## 2018-03-24 MED ORDER — CIPROFLOXACIN-DEXAMETHASONE 0.3-0.1 % OT SUSP: 4.0000 [drp] | Freq: Two times a day (BID) | OTIC | 0 refills | Status: DC

## 2018-03-24 MED ORDER — NITROFURANTOIN MONOHYD MACRO 100 MG PO CAPS: 100.0000 mg | ORAL_CAPSULE | Freq: Two times a day (BID) | ORAL | 0 refills | Status: DC

## 2018-03-24 NOTE — Progress Notes (Signed)
[facility] [address] [place], [place] [id number]  Internal MEDICINE  Office Visit Note  Patient Name: Jennifer RiseDoris R Snyder  [id number]  [id number]  Date of Service: 04/04/2018   Pt is here for routine health maintenance examination   Chief Complaint  Patient presents with  . Ear Pain    both  . Urinary Tract Infection  . Hypertension  . Hyperlipidemia  . Medicare Wellness     Urinary Tract Infection   This is a new problem. The current episode started in the past 7 days. The problem has been unchanged. The quality of the pain is described as burning. The pain is at a severity of 3/10. The pain is mild. There has been no fever. She is not sexually active. There is no history of pyelonephritis. Associated symptoms include frequency, nausea and urgency. Pertinent negatives include no chills, flank pain or vomiting. She has tried nothing for the symptoms. Her past medical history is significant for recurrent UTIs.    Current Medication: Outpatient Encounter Medications as of 03/24/2018  Medication Sig  . acetaminophen (TYLENOL) 325 MG tablet Take by mouth.  Marland Kitchen. aspirin 81 MG chewable tablet Chew by mouth.  . carbidopa-levodopa (SINEMET) 25-100 MG tablet Take 1 tab 8 times a day  . Carbidopa-Levodopa ER (SINEMET CR) 25-100 MG tablet controlled release TAKE 1 TABLET BY MOUTH EVERY DAY AT NIGHT  . clorazepate (TRANXENE) 3.75 MG tablet TAKE 1 TABLET BY MOUTH 1 TO 3 TIMES A DAY  . clotrimazole (LOTRIMIN) 1 % cream APPLY TO AFFECTED AREA TWICE A DAY FOR FUNGAL RASH  . clotrimazole-betamethasone (LOTRISONE) cream Apply topically 2 (two) times daily.  . dibucaine (NUPERCAINAL) 1 % OINT Place 1 application rectally as needed for hemorrhoids.  Marland Kitchen. gabapentin (NEURONTIN) 100 MG capsule TAKE 1 CAPSULE BY MOUTH THREE TIMES A DAY  . hydrocortisone (ANUSOL-HC) 2.5 % rectal cream Place 1 application rectally daily. Apply to affected area daily for 2 weeks as needed for hemorrhoids  .  lidocaine (LIDODERM) 5 % Place 1 patch onto the skin every 12 (twelve) hours. Remove & Discard patch within 12 hours or as directed by MD  . loratadine (ALLERGY RELIEF) 10 MG tablet Take 10 mg by mouth daily.  . methocarbamol (ROBAXIN) 750 MG tablet Take 1 tablet (750 mg total) by mouth every 6 (six) hours as needed for muscle spasms.  . metoprolol succinate (TOPROL-XL) 25 MG 24 hr tablet START WITH HALF TAB A DAY AND MAY INCREASE TO ONE TABLET DAILY  . mometasone (ELOCON) 0.1 % cream APPLY TO AFFECTED AREA TWICE DAILY  . nitrofurantoin, macrocrystal-monohydrate, (MACROBID) 100 MG capsule Take 1 capsule (100 mg total) by mouth 2 (two) times daily.  Marland Kitchen. oxybutynin (DITROPAN) 5 MG tablet Take 1 tablet (5 mg total) by mouth daily.  . pantoprazole (PROTONIX) 40 MG tablet Take 40 mg by mouth.  . polyethylene glycol (MIRALAX / GLYCOLAX) packet Take by mouth.  Marland Kitchen. rOPINIRole (REQUIP) 0.25 MG tablet ONE TO 3 TABS A DAY AS DIRECTED  . traMADol (ULTRAM) 50 MG tablet Take 1 tablet (50 mg total) by mouth every 6 (six) hours as needed. for pain  . [DISCONTINUED] nitrofurantoin, macrocrystal-monohydrate, (MACROBID) 100 MG capsule Take 1 capsule (100 mg total) by mouth 2 (two) times daily.  . ciprofloxacin-dexamethasone (CIPRODEX) OTIC suspension Place 4 drops into both ears 2 (two) times daily.  . [EXPIRED] cyanocobalamin ((VITAMIN B-12)) injection 1,000 mcg    No facility-administered encounter medications on file as of 03/24/2018.  Surgical History: Past Surgical History:  Procedure Laterality Date  . ABDOMINAL HERNIA REPAIR    . ADENOIDECTOMY  1994  . CATARACT EXTRACTION, BILATERAL  09/2014, 07/2014  . child birth  54, 24  . CHOLECYSTECTOMY  1989  . laser surgery on both eyes  11/2016  . left knee replacement  1993  . para esophogeal hernia repair    . TONSILLECTOMY  1944  . VAGINAL HYSTERECTOMY  1976    Medical History: Past Medical History:  Diagnosis Date  . Hyperlipidemia   .  Hypertension   . Osteoarthritis   . Parkinson's disease (HCC)   . Peptic ulcer disease     Family History: Family History  Problem Relation Age of Onset  . Lung cancer Mother   . Hyperlipidemia Mother   . Hypertension Mother   . Osteoarthritis Mother   . Stroke Mother       Review of Systems  Constitutional: Positive for fatigue. Negative for activity change, chills and diaphoresis.  HENT: Positive for ear pain. Negative for congestion, postnasal drip, sinus pressure, sore throat and voice change.        Ear canals itching and tender.  Eyes: Negative.   Respiratory: Negative for cough, shortness of breath and wheezing.   Cardiovascular: Negative for chest pain, palpitations and leg swelling.  Gastrointestinal: Positive for nausea. Negative for abdominal pain, constipation, diarrhea and vomiting.  Endocrine: Negative for cold intolerance, heat intolerance, polydipsia, polyphagia and polyuria.  Genitourinary: Positive for dysuria, frequency and urgency. Negative for flank pain.  Musculoskeletal: Positive for back pain and joint swelling. Negative for arthralgias, gait problem and neck pain.  Skin: Negative for color change and rash.  Allergic/Immunologic: Negative for environmental allergies and food allergies.  Neurological: Positive for tremors, speech difficulty and weakness. Negative for dizziness and headaches.  Hematological: Negative for adenopathy. Does not bruise/bleed easily.  Psychiatric/Behavioral: Positive for sleep disturbance. Negative for agitation, behavioral problems (depression) and hallucinations. The patient is nervous/anxious.      Today's Vitals   03/24/18 1123  BP: (!) 146/71  Pulse: 78  Resp: 16  SpO2: 98%  Weight: 131 lb 9.6 oz (59.7 kg)  Height: 5' (1.524 m)   Physical Exam  Constitutional: She is oriented to person, place, and time. She appears well-developed and well-nourished. No distress.  HENT:  Head: Normocephalic and atraumatic.  Right  Ear: There is tenderness. Tympanic membrane is erythematous and bulging.  Left Ear: There is tenderness. Tympanic membrane is erythematous and bulging.  Nose: Nose normal.  Mouth/Throat: Oropharynx is clear and moist. No oropharyngeal exudate.  Eyes: Pupils are equal, round, and reactive to light. Conjunctivae and EOM are normal.  Neck: Normal range of motion. Neck supple. No JVD present. Carotid bruit is not present. No tracheal deviation present. No thyromegaly present.  Cardiovascular: Normal rate, regular rhythm, normal heart sounds and intact distal pulses. Exam reveals no gallop and no friction rub.  No murmur heard. Pulmonary/Chest: Effort normal and breath sounds normal. No respiratory distress. She has no wheezes. She has no rales. She exhibits no tenderness.  Abdominal: Soft. Bowel sounds are normal. There is no tenderness.  Genitourinary:  Genitourinary Comments: Urine sample positive for protein and trace WBC  Musculoskeletal: Normal range of motion. She exhibits no edema.  Lymphadenopathy:    She has no cervical adenopathy.  Neurological: She is alert and oriented to person, place, and time. No cranial nerve deficit. Coordination abnormal.  Patient is at her neurological baseline.   Skin: Skin  is warm and dry. Capillary refill takes 2 to 3 seconds. She is not diaphoretic. No erythema.  Psychiatric: Her speech is normal and behavior is normal. Judgment and thought content normal. Her mood appears anxious. Cognition and memory are normal. She exhibits a depressed mood.  Nursing note and vitals reviewed.    LABS: Recent Results (from the past 2160 hour(s))  CULTURE, URINE COMPREHENSIVE     Status: Abnormal   Collection Time: 03/24/18 12:00 AM  Result Value Ref Range   Urine Culture, Comprehensive Final report (A)    Organism ID, Bacteria Enterococcus faecalis (A)     Comment: Greater than 100,000 colony forming units per mL   Organism ID, Bacteria Comment     Comment: Mixed  urogenital flora 10,000-25,000 colony forming units per mL    ANTIMICROBIAL SUSCEPTIBILITY Comment     Comment:       ** S = Susceptible; I = Intermediate; R = Resistant **                    P = Positive; N = Negative             MICS are expressed in micrograms per mL    Antibiotic                 RSLT#1    RSLT#2    RSLT#3    RSLT#4 Ciprofloxacin                  S Levofloxacin                   S Nitrofurantoin                 S Penicillin                     S Tetracycline                   S Vancomycin                     S   Specimen status report     Status: None   Collection Time: 03/24/18 12:00 AM  Result Value Ref Range   specimen status report Comment     Comment: Please note Please note The date and/or time of collection was not indicated on the requisition as required by state and federal law.  The date of receipt of the specimen was used as the collection date if not supplied.   POCT Urinalysis Dipstick     Status: Abnormal   Collection Time: 03/24/18 11:45 AM  Result Value Ref Range   Color, UA     Clarity, UA     Glucose, UA Negative Negative   Bilirubin, UA Negative    Ketones, UA trace    Spec Grav, UA 1.010 1.010 - 1.025   Blood, UA Negative    pH, UA 5.0 5.0 - 8.0   Protein, UA Positive (A) Negative   Urobilinogen, UA 0.2 0.2 or 1.0 E.U./dL   Nitrite, UA Negative    Leukocytes, UA Trace (A) Negative   Appearance     Odor     Assessment/Plan: 1. Encounter for general adult medical examination with abnormal findings Annual health maintenance exam today  2. Urinary tract infection without hematuria, site unspecified Start macrobid 100mg  twice daily for 7 days. Send urine for culture and sensitivity and adjust abx treatment as indicated.  -  CULTURE, URINE COMPREHENSIVE - nitrofurantoin, macrocrystal-monohydrate, (MACROBID) 100 MG capsule; Take 1 capsule (100 mg total) by mouth 2 (two) times daily.  Dispense: 14 capsule; Refill: 0  3. Chronic  otitis externa of both ears, unspecified type - ciprofloxacin-dexamethasone (CIPRODEX) OTIC suspension; Place 4 drops into both ears 2 (two) times daily.  Dispense: 7.5 mL; Refill: 0  4. B12 deficiency Continue monthly b12 injections with injection administered today.  - cyanocobalamin ((VITAMIN B-12)) injection 1,000 mcg  5. Essential hypertension Stable. Continue bp medication as prescribed .  6. Parkinson disease (HCC) Regular visits with neurology as scheduled.   7. Dysuria - POCT Urinalysis Dipstick  General Counseling: Glennis verbalizes understanding of the findings of todays visit and agrees with plan of treatment. I have discussed any further diagnostic evaluation that may be needed or ordered today. We also reviewed her medications today. she has been encouraged to call the office with any questions or concerns that should arise related to todays visit.    Counseling:  Hypertension Counseling:   The following hypertensive lifestyle modification were recommended and discussed:  1. Limiting alcohol intake to less than 1 oz/day of ethanol:(24 oz of beer or 8 oz of wine or 2 oz of 100-proof whiskey). 2. Take baby ASA 81 mg daily. 3. Importance of regular aerobic exercise and losing weight. 4. Reduce dietary saturated fat and cholesterol intake for overall cardiovascular health. 5. Maintaining adequate dietary potassium, calcium, and magnesium intake. 6. Regular monitoring of the blood pressure. 7. Reduce sodium intake to less than 100 mmol/day (less than 2.3 gm of sodium or less than 6 gm of sodium choride)   This patient was seen by Vincent Gros FNP Collaboration with Dr Lyndon Code as a part of collaborative care agreement  Orders Placed This Encounter  Procedures  . CULTURE, URINE COMPREHENSIVE  . Specimen status report  . POCT Urinalysis Dipstick    Meds ordered this encounter  Medications  . cyanocobalamin ((VITAMIN B-12)) injection 1,000 mcg  . nitrofurantoin,  macrocrystal-monohydrate, (MACROBID) 100 MG capsule    Sig: Take 1 capsule (100 mg total) by mouth 2 (two) times daily.    Dispense:  14 capsule    Refill:  0    Order Specific Question:   Supervising Provider    Answer:   Lyndon Code [[id number]]  . ciprofloxacin-dexamethasone (CIPRODEX) OTIC suspension    Sig: Place 4 drops into both ears 2 (two) times daily.    Dispense:  7.5 mL    Refill:  0    Order Specific Question:   Supervising Provider    Answer:   Lyndon Code [[id number]]    Time spent: 85 Minutes      Lyndon Code, MD  Internal Medicine

## 2018-03-25 ENCOUNTER — Telehealth: Payer: Self-pay

## 2018-03-25 NOTE — Telephone Encounter (Signed)
[facility] [phone]. [place]

## 2018-03-26 DIAGNOSIS — M3501 Sicca syndrome with keratoconjunctivitis: Secondary | ICD-10-CM | POA: Diagnosis not present

## 2018-03-27 LAB — SPECIMEN STATUS REPORT

## 2018-03-27 LAB — CULTURE, URINE COMPREHENSIVE

## 2018-03-30 ENCOUNTER — Telehealth: Payer: Self-pay

## 2018-04-04 DIAGNOSIS — R300 Dysuria: Secondary | ICD-10-CM | POA: Insufficient documentation

## 2018-04-04 DIAGNOSIS — Z0001 Encounter for general adult medical examination with abnormal findings: Secondary | ICD-10-CM | POA: Insufficient documentation

## 2018-04-04 DIAGNOSIS — H6063 Unspecified chronic otitis externa, bilateral: Secondary | ICD-10-CM | POA: Insufficient documentation

## 2018-04-04 DIAGNOSIS — N390 Urinary tract infection, site not specified: Secondary | ICD-10-CM | POA: Insufficient documentation

## 2018-04-13 ENCOUNTER — Telehealth: Payer: Self-pay

## 2018-04-13 NOTE — Telephone Encounter (Signed)
[facility] SIGNED 2 CMN'S FOR HOME HEALTH. MAILED TO [facility] ON 04-13-18

## 2018-04-28 ENCOUNTER — Other Ambulatory Visit: Payer: Self-pay

## 2018-04-28 ENCOUNTER — Other Ambulatory Visit: Payer: Self-pay | Admitting: Internal Medicine

## 2018-04-28 MED ORDER — METOPROLOL SUCCINATE ER 25 MG PO TB24: ORAL_TABLET | ORAL | 3 refills | Status: DC

## 2018-05-06 ENCOUNTER — Other Ambulatory Visit: Payer: Self-pay | Admitting: Nurse Practitioner

## 2018-05-06 MED ORDER — OXYBUTYNIN CHLORIDE 5 MG PO TABS: 5.0000 mg | ORAL_TABLET | Freq: Every day | ORAL | 3 refills | Status: DC

## 2018-05-10 ENCOUNTER — Other Ambulatory Visit: Payer: Self-pay

## 2018-05-10 MED ORDER — PANTOPRAZOLE SODIUM 40 MG PO TBEC: DELAYED_RELEASE_TABLET | ORAL | 5 refills | Status: DC

## 2018-05-11 ENCOUNTER — Encounter: Payer: Self-pay | Admitting: Adult Health

## 2018-05-11 ENCOUNTER — Ambulatory Visit (INDEPENDENT_AMBULATORY_CARE_PROVIDER_SITE_OTHER): Payer: Medicare Other | Admitting: Adult Health

## 2018-05-11 VITALS — BP 120/70 | HR 102 | Temp 97.5°F | Resp 16 | Ht 60.0 in | Wt 131.0 lb

## 2018-05-11 DIAGNOSIS — R000 Tachycardia, unspecified: Secondary | ICD-10-CM

## 2018-05-11 DIAGNOSIS — R300 Dysuria: Secondary | ICD-10-CM

## 2018-05-11 DIAGNOSIS — G20 Parkinson's disease: Secondary | ICD-10-CM | POA: Diagnosis not present

## 2018-05-11 DIAGNOSIS — R200 Anesthesia of skin: Secondary | ICD-10-CM | POA: Diagnosis not present

## 2018-05-11 DIAGNOSIS — G20A1 Parkinson's disease without dyskinesia, without mention of fluctuations: Secondary | ICD-10-CM

## 2018-05-11 LAB — POCT URINALYSIS DIPSTICK
Glucose, UA: NEGATIVE
Ketones, UA: NEGATIVE
LEUKOCYTES UA: NEGATIVE
NITRITE UA: NEGATIVE
PROTEIN UA: NEGATIVE
RBC UA: NEGATIVE
SPEC GRAV UA: 1.015 (ref 1.010–1.025)
Urobilinogen, UA: 0.2 E.U./dL
pH, UA: 5 (ref 5.0–8.0)

## 2018-05-11 NOTE — Progress Notes (Signed)
[facility] [address] [place], [place] [address]  Internal MEDICINE  Office Visit Note  Patient Name: Jennifer Snyder  [id number]  [id number]  Date of Service: 05/11/2018  Chief Complaint  Patient presents with  . Numbness    numbness in both legs , started a month ago waking up in middle of night and in the mornings   . Urinary Tract Infection    not urinating alot      HPI Pt is here for a sick visit. She is in exam room with care giver.  She reports that for over a week she has been complaining of intermittent numbness in her right leg, and sometimes left leg.  She denies falling or trauma. She also reports she has not been urinating as much. Urine dip is clear at this visit.  She reports she has not been drinking enough fluids lately, but her appetite has been sufficient.       Current Medication:  Outpatient Encounter Medications as of 05/11/2018  Medication Sig  . acetaminophen (TYLENOL) 325 MG tablet Take by mouth.  Marland Kitchen aspirin 81 MG chewable tablet Chew by mouth.  . carbidopa-levodopa (SINEMET) 25-100 MG tablet Take 1 tab 8 times a day  . Carbidopa-Levodopa ER (SINEMET CR) 25-100 MG tablet controlled release TAKE 1 TABLET BY MOUTH EVERY DAY AT NIGHT  . ciprofloxacin-dexamethasone (CIPRODEX) OTIC suspension Place 4 drops into both ears 2 (two) times daily.  . clorazepate (TRANXENE) 3.75 MG tablet TAKE 1 TABLET BY MOUTH 1 TO 3 TIMES A DAY  . clotrimazole (LOTRIMIN) 1 % cream APPLY TO AFFECTED AREA TWICE A DAY FOR FUNGAL RASH  . clotrimazole-betamethasone (LOTRISONE) cream Apply topically 2 (two) times daily.  . dibucaine (NUPERCAINAL) 1 % OINT Place 1 application rectally as needed for hemorrhoids.  Marland Kitchen gabapentin (NEURONTIN) 100 MG capsule TAKE ONE CAPSULE BY MOUTH 3 TIMES A DAY AS NEEDED FOR LEG AND NERVE PAIN  . hydrocortisone (ANUSOL-HC) 2.5 % rectal cream Place 1 application rectally daily. Apply to affected area daily for 2 weeks as needed for hemorrhoids   . lidocaine (LIDODERM) 5 % Place 1 patch onto the skin every 12 (twelve) hours. Remove & Discard patch within 12 hours or as directed by MD  . loratadine (ALLERGY RELIEF) 10 MG tablet Take 10 mg by mouth daily.  . methocarbamol (ROBAXIN) 750 MG tablet Take 1 tablet (750 mg total) by mouth every 6 (six) hours as needed for muscle spasms.  . metoprolol succinate (TOPROL-XL) 25 MG 24 hr tablet START WITH HALF TAB A DAY AND MAY INCREASE TO ONE TABLET DAILY  . mometasone (ELOCON) 0.1 % cream APPLY TO AFFECTED AREA TWICE DAILY  . nitrofurantoin, macrocrystal-monohydrate, (MACROBID) 100 MG capsule Take 1 capsule (100 mg total) by mouth 2 (two) times daily.  Marland Kitchen oxybutynin (DITROPAN) 5 MG tablet Take 1 tablet (5 mg total) by mouth daily.  . pantoprazole (PROTONIX) 40 MG tablet Take 1 tab qd for gerd  . polyethylene glycol (MIRALAX / GLYCOLAX) packet Take by mouth.  Marland Kitchen rOPINIRole (REQUIP) 0.25 MG tablet ONE TO 3 TABS A DAY AS DIRECTED  . traMADol (ULTRAM) 50 MG tablet Take 1 tablet (50 mg total) by mouth every 6 (six) hours as needed. for pain   No facility-administered encounter medications on file as of 05/11/2018.       Medical History: Past Medical History:  Diagnosis Date  . Hyperlipidemia   . Hypertension   . Osteoarthritis   . Parkinson's disease (HCC)   .  Peptic ulcer disease      Vital Signs: BP 120/70   Pulse (!) 102   Temp (!) 97.5 F (36.4 C)   Resp 16   Ht 5' (1.524 m)   Wt 131 lb (59.4 kg)   SpO2 95%   BMI 25.58 kg/m    Review of Systems  Constitutional: Negative for chills, fatigue and unexpected weight change.  HENT: Negative for congestion, rhinorrhea, sneezing and sore throat.   Eyes: Negative for photophobia, pain and redness.  Respiratory: Negative for cough, chest tightness and shortness of breath.   Cardiovascular: Negative for chest pain and palpitations.  Gastrointestinal: Negative for abdominal pain, constipation, diarrhea, nausea and vomiting.   Endocrine: Negative.   Genitourinary: Negative for dysuria and frequency.  Musculoskeletal: Negative for arthralgias, back pain, joint swelling and neck pain.  Skin: Negative for rash.  Allergic/Immunologic: Negative.   Neurological: Negative for tremors and numbness.  Hematological: Negative for adenopathy. Does not bruise/bleed easily.  Psychiatric/Behavioral: Negative for behavioral problems and sleep disturbance. The patient is not nervous/anxious.     Physical Exam  Constitutional: She is oriented to person, place, and time. She appears well-developed and well-nourished. No distress.  HENT:  Head: Normocephalic and atraumatic.  Mouth/Throat: Oropharynx is clear and moist. No oropharyngeal exudate.  Eyes: Pupils are equal, round, and reactive to light. EOM are normal.  Neck: Normal range of motion. Neck supple. No JVD present. No tracheal deviation present. No thyromegaly present.  Cardiovascular: Normal rate, regular rhythm and normal heart sounds. Exam reveals no gallop and no friction rub.  No murmur heard. Pulmonary/Chest: Effort normal and breath sounds normal. No respiratory distress. She has no wheezes. She has no rales. She exhibits no tenderness.  Abdominal: Soft. There is no tenderness. There is no guarding.  Musculoskeletal: Normal range of motion.  Lymphadenopathy:    She has no cervical adenopathy.  Neurological: She is alert and oriented to person, place, and time. No cranial nerve deficit.  Skin: Skin is warm and dry. She is not diaphoretic.  Psychiatric: She has a normal mood and affect. Her behavior is normal. Judgment and thought content normal.  Nursing note and vitals reviewed.   Assessment/Plan: 1. Bilateral leg numbness Likely due to posture, and progression of Parkinson's disease.  Pt is non-ambulatory, and wheelchair bound.  2. Parkinson disease (HCC) Pt is wheelchair bound, continue current therapy.   3. Dysuria Urine clear at this time.   POCT  Urinalysis Dipstick  4. Elevated pulse rate Slightly elevated at this visit.  Could be due to pain, or exertion.   General Counseling: [name] verbalizes understanding of the findings of todays visit and agrees with plan of treatment. I have discussed any further diagnostic evaluation that may be needed or ordered today. We also reviewed her medications today. she has been encouraged to call the office with any questions or concerns that should arise related to todays visit.   Orders Placed This Encounter  Procedures  . POCT Urinalysis Dipstick    No orders of the defined types were placed in this encounter.   Time spent: 25 Minutes  This patient was seen by Blima Ledger AGNP-C in Collaboration with Dr Lyndon Code as a part of collaborative care agreement

## 2018-05-19 DIAGNOSIS — G5600 Carpal tunnel syndrome, unspecified upper limb: Secondary | ICD-10-CM | POA: Diagnosis not present

## 2018-05-19 DIAGNOSIS — Z8673 Personal history of transient ischemic attack (TIA), and cerebral infarction without residual deficits: Secondary | ICD-10-CM | POA: Diagnosis not present

## 2018-05-19 DIAGNOSIS — G20 Parkinson's disease: Secondary | ICD-10-CM | POA: Diagnosis not present

## 2018-05-26 ENCOUNTER — Ambulatory Visit (INDEPENDENT_AMBULATORY_CARE_PROVIDER_SITE_OTHER): Payer: Medicare Other | Admitting: Adult Health

## 2018-05-26 ENCOUNTER — Encounter: Payer: Self-pay | Admitting: Adult Health

## 2018-05-26 VITALS — BP 132/82 | HR 91 | Temp 97.6°F | Resp 18 | Ht 60.0 in | Wt 150.0 lb

## 2018-05-26 DIAGNOSIS — R319 Hematuria, unspecified: Secondary | ICD-10-CM | POA: Diagnosis not present

## 2018-05-26 DIAGNOSIS — R300 Dysuria: Secondary | ICD-10-CM | POA: Diagnosis not present

## 2018-05-26 DIAGNOSIS — R600 Localized edema: Secondary | ICD-10-CM

## 2018-05-26 DIAGNOSIS — N390 Urinary tract infection, site not specified: Secondary | ICD-10-CM | POA: Diagnosis not present

## 2018-05-26 DIAGNOSIS — I10 Essential (primary) hypertension: Secondary | ICD-10-CM

## 2018-05-26 LAB — POCT URINALYSIS DIPSTICK
Bilirubin, UA: NEGATIVE
Glucose, UA: NEGATIVE
KETONES UA: NEGATIVE
Nitrite, UA: NEGATIVE
Protein, UA: NEGATIVE
SPEC GRAV UA: 1.01 (ref 1.010–1.025)
UROBILINOGEN UA: 0.2 U/dL
pH, UA: 7.5 (ref 5.0–8.0)

## 2018-05-26 NOTE — Patient Instructions (Signed)

## 2018-05-26 NOTE — Progress Notes (Signed)
[facility] [address] [place], [place] [address]  Internal MEDICINE  Office Visit Note  Patient Name: Jennifer Snyder  [id number]  [id number]  Date of Service: 05/26/2018  Chief Complaint  Patient presents with  . Edema    both feet, noticed it the end of last week, right foot worse.     HPI Pt is here for a sick visit.  She reports bilateral lower leg edema that is worse in her right foot.  She reports this is been present for just over a week.  Her caregiver is present in exam room.  She reports that she has been trying to keep her feet elevated, and she has been reducing the amount of salty snacks that she has been eating.  Caregiver has also noticed times when she feels like the patient is short of breath.  Although the patient does not report shortness of breath when prompted.  I do noticed that in this visit she does appear to stop and breath midsentence.     Current Medication:  Outpatient Encounter Medications as of 05/26/2018  Medication Sig  . acetaminophen (TYLENOL) 325 MG tablet Take by mouth.  Marland Kitchen aspirin 81 MG chewable tablet Chew by mouth.  . carbidopa-levodopa (SINEMET) 25-100 MG tablet Take 1 tab 8 times a day  . Carbidopa-Levodopa ER (SINEMET CR) 25-100 MG tablet controlled release TAKE 1 TABLET BY MOUTH EVERY DAY AT NIGHT  . ciprofloxacin-dexamethasone (CIPRODEX) OTIC suspension Place 4 drops into both ears 2 (two) times daily.  . clorazepate (TRANXENE) 3.75 MG tablet TAKE 1 TABLET BY MOUTH 1 TO 3 TIMES A DAY  . clotrimazole (LOTRIMIN) 1 % cream APPLY TO AFFECTED AREA TWICE A DAY FOR FUNGAL RASH  . clotrimazole-betamethasone (LOTRISONE) cream Apply topically 2 (two) times daily.  . dibucaine (NUPERCAINAL) 1 % OINT Place 1 application rectally as needed for hemorrhoids.  Marland Kitchen gabapentin (NEURONTIN) 100 MG capsule TAKE ONE CAPSULE BY MOUTH 3 TIMES A DAY AS NEEDED FOR LEG AND NERVE PAIN  . hydrocortisone (ANUSOL-HC) 2.5 % rectal cream Place 1  application rectally daily. Apply to affected area daily for 2 weeks as needed for hemorrhoids  . lidocaine (LIDODERM) 5 % Place 1 patch onto the skin every 12 (twelve) hours. Remove & Discard patch within 12 hours or as directed by MD  . loratadine (ALLERGY RELIEF) 10 MG tablet Take 10 mg by mouth daily.  . methocarbamol (ROBAXIN) 750 MG tablet Take 1 tablet (750 mg total) by mouth every 6 (six) hours as needed for muscle spasms.  . metoprolol succinate (TOPROL-XL) 25 MG 24 hr tablet START WITH HALF TAB A DAY AND MAY INCREASE TO ONE TABLET DAILY  . mometasone (ELOCON) 0.1 % cream APPLY TO AFFECTED AREA TWICE DAILY  . nitrofurantoin, macrocrystal-monohydrate, (MACROBID) 100 MG capsule Take 1 capsule (100 mg total) by mouth 2 (two) times daily.  Marland Kitchen oxybutynin (DITROPAN) 5 MG tablet Take 1 tablet (5 mg total) by mouth daily.  . pantoprazole (PROTONIX) 40 MG tablet Take 1 tab qd for gerd  . polyethylene glycol (MIRALAX / GLYCOLAX) packet Take by mouth.  Marland Kitchen rOPINIRole (REQUIP) 0.25 MG tablet ONE TO 3 TABS A DAY AS DIRECTED  . traMADol (ULTRAM) 50 MG tablet Take 1 tablet (50 mg total) by mouth every 6 (six) hours as needed. for pain   No facility-administered encounter medications on file as of 05/26/2018.       Medical History: Past Medical History:  Diagnosis Date  . Hyperlipidemia   .  Hypertension   . Osteoarthritis   . Parkinson's disease (HCC)   . Peptic ulcer disease      Vital Signs: BP 132/82   Pulse 91   Temp 97.6 F (36.4 C)   Resp 18   Ht 5' (1.524 m)   Wt 150 lb (68 kg)   SpO2 91%   BMI 29.29 kg/m    Review of Systems  Constitutional: Negative for chills, fatigue and unexpected weight change.  HENT: Negative for congestion, rhinorrhea, sneezing and sore throat.   Eyes: Negative for photophobia, pain and redness.  Respiratory: Negative for cough, chest tightness and shortness of breath.   Cardiovascular: Negative for chest pain and palpitations.  Gastrointestinal:  Negative for abdominal pain, constipation, diarrhea, nausea and vomiting.  Endocrine: Negative.   Genitourinary: Negative for dysuria and frequency.  Musculoskeletal: Negative for arthralgias, back pain, joint swelling and neck pain.  Skin: Negative for rash.  Allergic/Immunologic: Negative.   Neurological: Negative for tremors and numbness.  Hematological: Negative for adenopathy. Does not bruise/bleed easily.  Psychiatric/Behavioral: Negative for behavioral problems and sleep disturbance. The patient is not nervous/anxious.     Physical Exam  Constitutional: She is oriented to person, place, and time. She appears well-developed and well-nourished. No distress.  HENT:  Head: Normocephalic and atraumatic.  Mouth/Throat: Oropharynx is clear and moist. No oropharyngeal exudate.  Eyes: Pupils are equal, round, and reactive to light. EOM are normal.  Neck: Normal range of motion. Neck supple. No JVD present. No tracheal deviation present. No thyromegaly present.  Cardiovascular: Normal rate, regular rhythm and normal heart sounds. Exam reveals no gallop and no friction rub.  No murmur heard. Pulmonary/Chest: Effort normal and breath sounds normal. No respiratory distress. She has no wheezes. She has no rales. She exhibits no tenderness.  Abdominal: Soft. There is no tenderness. There is no guarding.  Musculoskeletal: Normal range of motion. She exhibits edema.  Bilateral lower leg/foot edema. 1+  Lymphadenopathy:    She has no cervical adenopathy.  Neurological: She is alert and oriented to person, place, and time. No cranial nerve deficit.  Skin: Skin is warm and dry. She is not diaphoretic.  Psychiatric: She has a normal mood and affect. Her behavior is normal. Judgment and thought content normal.  Nursing note and vitals reviewed.   Assessment/Plan: 1. Essential hypertension Blood pressure appears to be controlled currently on metoprolol.  Continue current medicine regimen, will  follow up in the future.  2. Urinary tract infection with hematuria, site unspecified Patient's urine dipped in office and found trace blood and leukocytes.  Will do culture.  Discussed with patient and caregiver that because she does not report symptoms would wait for culture before treating antibiotics.  Patient and caregiver agreeable to this. - CULTURE, URINE COMPREHENSIVE  3. Pedal edema Will obtain labs since there are no recent laboratory values available.  Also scheduling patient for echocardiogram to evaluate heart function.  If echo is normal we will consider small dose of diuretic to assist patient comfort. - ECHOCARDIOGRAM COMPLETE; Future - CBC with Differential/Platelet - Lipid Panel With LDL/HDL Ratio - TSH - T4, free - Comprehensive metabolic panel  4. Dysuria - POCT Urinalysis Dipstick  General Counseling: Emmaclaire verbalizes understanding of the findings of todays visit and agrees with plan of treatment. I have discussed any further diagnostic evaluation that may be needed or ordered today. We also reviewed her medications today. she has been encouraged to call the office with any questions or concerns that should  arise related to todays visit.   Orders Placed This Encounter  Procedures  . CULTURE, URINE COMPREHENSIVE  . CBC with Differential/Platelet  . Lipid Panel With LDL/HDL Ratio  . TSH  . T4, free  . Comprehensive metabolic panel  . POCT Urinalysis Dipstick  . ECHOCARDIOGRAM COMPLETE    No orders of the defined types were placed in this encounter.   Time spent: 30 Minutes  This patient was seen by Blima Ledger AGNP-C in Collaboration with Dr Lyndon Code as a part of collaborative care agreement.  Johnna Acosta AGNP-C Internal Medicine

## 2018-05-27 LAB — CBC WITH DIFFERENTIAL/PLATELET
BASOS: 0 %
Basophils Absolute: 0 10*3/uL (ref 0.0–0.2)
EOS (ABSOLUTE): 0.1 10*3/uL (ref 0.0–0.4)
EOS: 1 %
HEMATOCRIT: 35.8 % (ref 34.0–46.6)
Hemoglobin: 12 g/dL (ref 11.1–15.9)
Immature Grans (Abs): 0 10*3/uL (ref 0.0–0.1)
Immature Granulocytes: 0 %
LYMPHS ABS: 1.9 10*3/uL (ref 0.7–3.1)
Lymphs: 29 %
MCH: 28.2 pg (ref 26.6–33.0)
MCHC: 33.5 g/dL (ref 31.5–35.7)
MCV: 84 fL (ref 79–97)
MONOS ABS: 0.4 10*3/uL (ref 0.1–0.9)
Monocytes: 6 %
Neutrophils Absolute: 4.1 10*3/uL (ref 1.4–7.0)
Neutrophils: 64 %
PLATELETS: 243 10*3/uL (ref 150–450)
RBC: 4.26 x10E6/uL (ref 3.77–5.28)
RDW: 13.4 % (ref 12.3–15.4)
WBC: 6.5 10*3/uL (ref 3.4–10.8)

## 2018-05-27 LAB — COMPREHENSIVE METABOLIC PANEL
ALK PHOS: 90 IU/L (ref 39–117)
ALT: 6 IU/L (ref 0–32)
AST: 13 IU/L (ref 0–40)
Albumin/Globulin Ratio: 1.6 (ref 1.2–2.2)
Albumin: 3.8 g/dL (ref 3.5–4.7)
BUN/Creatinine Ratio: 35 — ABNORMAL HIGH (ref 12–28)
BUN: 23 mg/dL (ref 8–27)
Bilirubin Total: <0.2 mg/dL (ref 0.0–1.2)
CO2: 25 mmol/L (ref 20–29)
CREATININE: 0.65 mg/dL (ref 0.57–1.00)
Calcium: 9.8 mg/dL (ref 8.7–10.3)
Chloride: 99 mmol/L (ref 96–106)
GFR calc Af Amer: 97 mL/min/{1.73_m2} (ref >59)
GFR calc non Af Amer: 84 mL/min/{1.73_m2} (ref >59)
GLOBULIN, TOTAL: 2.4 g/dL (ref 1.5–4.5)
GLUCOSE: 190 mg/dL — AB (ref 65–99)
Potassium: 4.2 mmol/L (ref 3.5–5.2)
SODIUM: 138 mmol/L (ref 134–144)
Total Protein: 6.2 g/dL (ref 6.0–8.5)

## 2018-05-27 LAB — LIPID PANEL WITH LDL/HDL RATIO
Cholesterol, Total: 230 mg/dL — ABNORMAL HIGH (ref 100–199)
HDL: 80 mg/dL (ref >39)
LDL Calculated: 116 mg/dL — ABNORMAL HIGH (ref 0–99)
LDL/HDL RATIO: 1.5 ratio (ref 0.0–3.2)
Triglycerides: 171 mg/dL — ABNORMAL HIGH (ref 0–149)
VLDL Cholesterol Cal: 34 mg/dL (ref 5–40)

## 2018-05-27 LAB — TSH: TSH: 1.23 u[IU]/mL (ref 0.450–4.500)

## 2018-05-27 LAB — T4, FREE: Free T4: 1.35 ng/dL (ref 0.82–1.77)

## 2018-05-29 LAB — CULTURE, URINE COMPREHENSIVE

## 2018-06-10 ENCOUNTER — Telehealth: Payer: Self-pay

## 2018-06-10 ENCOUNTER — Other Ambulatory Visit: Payer: Self-pay | Admitting: Adult Health

## 2018-06-10 MED ORDER — SULFAMETHOXAZOLE-TRIMETHOPRIM 400-80 MG PO TABS
1.0000 | ORAL_TABLET | Freq: Two times a day (BID) | ORAL | 0 refills | Status: DC
Start: 2018-06-10 — End: 2019-06-14

## 2018-06-10 NOTE — Telephone Encounter (Signed)
Spoke with pt caregiver to inform her of pt urine results also advised her that she has an antibiotic at her pharmacy.

## 2018-06-10 NOTE — Telephone Encounter (Signed)
-----   Message from Johnna Acosta, NP sent at 06/10/2018 10:12 AM EST ----- Sent antibiotic Septra to [facility] [place].  Pt's urine grew a little e. Coli. Please call and let her know.

## 2018-06-10 NOTE — Progress Notes (Signed)
Septra DS sent for patients UTI.  [facility].

## 2018-06-11 ENCOUNTER — Other Ambulatory Visit: Payer: Self-pay | Admitting: Nurse Practitioner

## 2018-06-11 ENCOUNTER — Ambulatory Visit: Payer: Medicare Other

## 2018-06-11 ENCOUNTER — Other Ambulatory Visit: Payer: Self-pay | Admitting: Adult Health

## 2018-06-11 DIAGNOSIS — R0602 Shortness of breath: Secondary | ICD-10-CM | POA: Diagnosis not present

## 2018-06-11 DIAGNOSIS — M62838 Other muscle spasm: Secondary | ICD-10-CM

## 2018-06-11 DIAGNOSIS — R600 Localized edema: Secondary | ICD-10-CM

## 2018-06-15 ENCOUNTER — Telehealth: Payer: Self-pay

## 2018-06-15 NOTE — Telephone Encounter (Signed)
Pt advised echo looks good we going to order Over night oxymetry

## 2018-06-16 ENCOUNTER — Telehealth: Payer: Self-pay | Admitting: Adult Health

## 2018-06-16 ENCOUNTER — Other Ambulatory Visit: Payer: Self-pay | Admitting: Adult Health

## 2018-06-16 ENCOUNTER — Telehealth: Payer: Self-pay

## 2018-06-16 DIAGNOSIS — T501X5S Adverse effect of loop [high-ceiling] diuretics, sequela: Secondary | ICD-10-CM

## 2018-06-16 DIAGNOSIS — R600 Localized edema: Secondary | ICD-10-CM

## 2018-06-16 DIAGNOSIS — E876 Hypokalemia: Secondary | ICD-10-CM

## 2018-06-16 DIAGNOSIS — E878 Other disorders of electrolyte and fluid balance, not elsewhere classified: Secondary | ICD-10-CM

## 2018-06-16 DIAGNOSIS — T502X5A Adverse effect of carbonic-anhydrase inhibitors, benzothiadiazides and other diuretics, initial encounter: Principal | ICD-10-CM

## 2018-06-16 MED ORDER — FUROSEMIDE 20 MG PO TABS: ORAL_TABLET | ORAL | 0 refills | Status: DC

## 2018-06-16 NOTE — Telephone Encounter (Signed)
Gave [facility] order for Overnight Oximetry Test.Beth

## 2018-06-16 NOTE — Telephone Encounter (Signed)
Patient and caretaker advised on lasix to take every other day, patient will repeat labs on next visit 07/06/18

## 2018-06-16 NOTE — Progress Notes (Signed)
CMP ordered to check patients potassium after 2 weeks of diuretic use.

## 2018-06-18 ENCOUNTER — Other Ambulatory Visit: Payer: Self-pay

## 2018-07-05 ENCOUNTER — Encounter: Payer: Self-pay | Admitting: Adult Health

## 2018-07-06 ENCOUNTER — Ambulatory Visit (INDEPENDENT_AMBULATORY_CARE_PROVIDER_SITE_OTHER): Payer: Medicare Other | Admitting: Adult Health

## 2018-07-06 ENCOUNTER — Encounter: Payer: Self-pay | Admitting: Adult Health

## 2018-07-06 DIAGNOSIS — G4734 Idiopathic sleep related nonobstructive alveolar hypoventilation: Secondary | ICD-10-CM

## 2018-07-06 DIAGNOSIS — Z79899 Other long term (current) drug therapy: Secondary | ICD-10-CM | POA: Diagnosis not present

## 2018-07-06 DIAGNOSIS — E785 Hyperlipidemia, unspecified: Secondary | ICD-10-CM

## 2018-07-06 DIAGNOSIS — I5033 Acute on chronic diastolic (congestive) heart failure: Secondary | ICD-10-CM | POA: Diagnosis not present

## 2018-07-06 DIAGNOSIS — I10 Essential (primary) hypertension: Secondary | ICD-10-CM | POA: Diagnosis not present

## 2018-07-06 DIAGNOSIS — R300 Dysuria: Secondary | ICD-10-CM

## 2018-07-06 LAB — POCT URINALYSIS DIPSTICK
Bilirubin, UA: NEGATIVE
Blood, UA: NEGATIVE
Glucose, UA: NEGATIVE
KETONES UA: NEGATIVE
Leukocytes, UA: NEGATIVE
NITRITE UA: NEGATIVE
PH UA: 6 (ref 5.0–8.0)
PROTEIN UA: NEGATIVE
Spec Grav, UA: 1.01 (ref 1.010–1.025)
UROBILINOGEN UA: 0.2 U/dL

## 2018-07-06 MED ORDER — FUROSEMIDE 20 MG PO TABS: ORAL_TABLET | ORAL | 1 refills | Status: DC

## 2018-07-06 NOTE — Progress Notes (Addendum)
[facility] [address] [place], [place] [address]  Internal MEDICINE  Office Visit Note  Patient Name: Jennifer Snyder  [id number]  [id number]  Date of Service: 07/06/2018  Chief Complaint  Patient presents with  . Hypertension  . Hyperlipidemia    HPI  Pt is here for follow up on HTN, HLD, and overnight oximetry results.  Patient's blood pressure is slightly elevated at today's visit, 140/90.  She denies any headache, chest pain, palpitations or other symptoms.  Her most recent lipid panel is available for review.  She had an overnight oximetry performed which showed that she was less than 88% for 19.5 minutes.  She qualifies for nocturnal oxygen which will be ordered at today's visit. Pt has s/s of right sided heart failure as well, echo is abnormal    Current Medication: Outpatient Encounter Medications as of 07/06/2018  Medication Sig  . acetaminophen (TYLENOL) 325 MG tablet Take by mouth.  Marland Kitchen aspirin 81 MG chewable tablet Chew by mouth.  . carbidopa-levodopa (SINEMET) 25-100 MG tablet Take 1 tab 8 times a day  . Carbidopa-Levodopa ER (SINEMET CR) 25-100 MG tablet controlled release TAKE 1 TABLET BY MOUTH EVERY DAY AT NIGHT  . ciprofloxacin-dexamethasone (CIPRODEX) OTIC suspension Place 4 drops into both ears 2 (two) times daily.  . clorazepate (TRANXENE) 3.75 MG tablet TAKE 1 TABLET BY MOUTH 1 TO 3 TIMES A DAY  . clotrimazole (LOTRIMIN) 1 % cream APPLY TO AFFECTED AREA TWICE A DAY FOR FUNGAL RASH  . clotrimazole-betamethasone (LOTRISONE) cream Apply topically 2 (two) times daily.  . dibucaine (NUPERCAINAL) 1 % OINT Place 1 application rectally as needed for hemorrhoids.  . furosemide (LASIX) 20 MG tablet Take one tab every other day  . gabapentin (NEURONTIN) 100 MG capsule TAKE ONE CAPSULE BY MOUTH 3 TIMES A DAY AS NEEDED FOR LEG AND NERVE PAIN  . hydrocortisone (ANUSOL-HC) 2.5 % rectal cream Place 1 application rectally daily. Apply to affected area daily for 2  weeks as needed for hemorrhoids  . lidocaine (LIDODERM) 5 % Place 1 patch onto the skin every 12 (twelve) hours. Remove & Discard patch within 12 hours or as directed by MD  . loratadine (ALLERGY RELIEF) 10 MG tablet Take 10 mg by mouth daily.  . methocarbamol (ROBAXIN) 750 MG tablet TAKE 1 TABLET (750 MG TOTAL) BY MOUTH EVERY 6 (SIX) HOURS AS NEEDED FOR MUSCLE SPASMS.  . metoprolol succinate (TOPROL-XL) 25 MG 24 hr tablet START WITH HALF TAB A DAY AND MAY INCREASE TO ONE TABLET DAILY  . mometasone (ELOCON) 0.1 % cream APPLY TO AFFECTED AREA TWICE DAILY  . nitrofurantoin, macrocrystal-monohydrate, (MACROBID) 100 MG capsule Take 1 capsule (100 mg total) by mouth 2 (two) times daily.  Marland Kitchen oxybutynin (DITROPAN) 5 MG tablet Take 1 tablet (5 mg total) by mouth daily.  . pantoprazole (PROTONIX) 40 MG tablet Take 1 tab qd for gerd  . polyethylene glycol (MIRALAX / GLYCOLAX) packet Take by mouth.  Marland Kitchen rOPINIRole (REQUIP) 0.25 MG tablet ONE TO 3 TABS A DAY AS DIRECTED  . sulfamethoxazole-trimethoprim (BACTRIM) 400-80 MG tablet Take 1 tablet by mouth 2 (two) times daily.  . traMADol (ULTRAM) 50 MG tablet Take 1 tablet (50 mg total) by mouth every 6 (six) hours as needed. for pain  . [DISCONTINUED] furosemide (LASIX) 20 MG tablet Take one tab every other day   No facility-administered encounter medications on file as of 07/06/2018.     Surgical History: Past Surgical History:  Procedure Laterality Date  .  ABDOMINAL HERNIA REPAIR    . ADENOIDECTOMY  1994  . CATARACT EXTRACTION, BILATERAL  09/2014, 07/2014  . child birth  32, 14  . CHOLECYSTECTOMY  1989  . laser surgery on both eyes  11/2016  . left knee replacement  1993  . para esophogeal hernia repair    . TONSILLECTOMY  1944  . VAGINAL HYSTERECTOMY  1976    Medical History: Past Medical History:  Diagnosis Date  . Hyperlipidemia   . Hypertension   . Osteoarthritis   . Parkinson's disease (HCC)   . Peptic ulcer disease     Family  History: Family History  Problem Relation Age of Onset  . Lung cancer Mother   . Hyperlipidemia Mother   . Hypertension Mother   . Osteoarthritis Mother   . Stroke Mother     Social History   Socioeconomic History  . Marital status: Single    Spouse name: Not on file  . Number of children: Not on file  . Years of education: Not on file  . Highest education level: Not on file  Occupational History  . Not on file  Social Needs  . Financial resource strain: Not on file  . Food insecurity:    Worry: Not on file    Inability: Not on file  . Transportation needs:    Medical: Not on file    Non-medical: Not on file  Tobacco Use  . Smoking status: Never Smoker  . Smokeless tobacco: Never Used  Substance and Sexual Activity  . Alcohol use: No    Frequency: Never  . Drug use: No  . Sexual activity: Not on file  Lifestyle  . Physical activity:    Days per week: Not on file    Minutes per session: Not on file  . Stress: Not on file  Relationships  . Social connections:    Talks on phone: Not on file    Gets together: Not on file    Attends religious service: Not on file    Active member of club or organization: Not on file    Attends meetings of clubs or organizations: Not on file    Relationship status: Not on file  . Intimate partner violence:    Fear of current or ex partner: Not on file    Emotionally abused: Not on file    Physically abused: Not on file    Forced sexual activity: Not on file  Other Topics Concern  . Not on file  Social History Narrative  . Not on file      Review of Systems  Constitutional: Negative for chills, fatigue and unexpected weight change.  HENT: Negative for congestion, rhinorrhea, sneezing and sore throat.   Eyes: Negative for photophobia, pain and redness.  Respiratory: Negative for cough, chest tightness and shortness of breath.   Cardiovascular: Negative for chest pain and palpitations.  Gastrointestinal: Negative for  abdominal pain, constipation, diarrhea, nausea and vomiting.  Endocrine: Negative.   Genitourinary: Negative for dysuria and frequency.  Musculoskeletal: Negative for arthralgias, back pain, joint swelling and neck pain.  Skin: Negative for rash.  Allergic/Immunologic: Negative.   Neurological: Negative for tremors and numbness.  Hematological: Negative for adenopathy. Does not bruise/bleed easily.  Psychiatric/Behavioral: Negative for behavioral problems and sleep disturbance. The patient is not nervous/anxious.    Vital Signs: BP (!) 140/92 (BP Location: Left Arm, Patient Position: Sitting, Cuff Size: Normal)   Pulse 97   Resp 16   Ht 5\' 3"  (1.6  m)   Wt 148 lb (67.1 kg)   SpO2 97%   BMI 26.22 kg/m   Physical Exam Vitals signs and nursing note reviewed.  Constitutional:      General: She is not in acute distress.    Appearance: She is well-developed and well-nourished. She is not diaphoretic.  HENT:     Head: Normocephalic and atraumatic.     Mouth/Throat:     Mouth: Oropharynx is clear and moist.     Pharynx: No oropharyngeal exudate.  Eyes:     Extraocular Movements: EOM normal.     Pupils: Pupils are equal, round, and reactive to light.  Neck:     Musculoskeletal: Normal range of motion and neck supple.     Thyroid: No thyromegaly.     Vascular: No JVD.     Trachea: No tracheal deviation.  Cardiovascular:     Rate and Rhythm: Normal rate and regular rhythm.     Heart sounds: Normal heart sounds. No murmur. No friction rub. No gallop.   Pulmonary:     Effort: Pulmonary effort is normal. No respiratory distress.     Breath sounds: Rales present. No wheezing.  Chest:     Chest wall: No tenderness.  Abdominal:     Palpations: Abdomen is soft.     Tenderness: There is no abdominal tenderness. There is no guarding.  Musculoskeletal: Normal range of motion.        General: Edema present.  Lymphadenopathy:     Cervical: No cervical adenopathy.  Skin:    General: Skin  is warm and dry.  Neurological:     Mental Status: She is alert and oriented to person, place, and time.     Cranial Nerves: No cranial nerve deficit.  Psychiatric:        Mood and Affect: Mood and affect normal.        Behavior: Behavior normal.        Thought Content: Thought content normal.        Judgment: Judgment normal.    Assessment/Plan: 1. Acute on chronic diastolic heart failure (HCC) - Echocardiogram is discussed with pt, she has grade to diastolic dysfunction, restrictive pattern leading to corpulmonale and hypoxia,    2. Nocturnal hypoxia Patient's overnight oximetry showed a O2 saturation was 88% per 19.5 minutes.  This is significant hypoxia and patient will need to use oxygen at night and during the day while sleeping.  I recommended the patient start at 2 L/min via nasal cannula and may titrate up to 4 days/min as tolerated.  3. Long term current use of diuretic CMP is ordered at this visit to evaluate patient's potassium level as she is on chronic Lasix. - Comprehensive metabolic panel  4. Essential hypertension Slightly elevated at this time.  We will continue to monitor at future visits.  Patient continue taking all her medications as well as her Lasix until further notice.  5. Hyperlipidemia, unspecified hyperlipidemia type Patient should continue her cholesterol medication as prescribed.  6. Dysuria - POCT Urinalysis Dipstick  General Counseling: Jessenia verbalizes understanding of the findings of todays visit and agrees with plan of treatment. I have discussed any further diagnostic evaluation that may be needed or ordered today. We also reviewed her medications today. she has been encouraged to call the office with any questions or concerns that should arise related to todays visit.   Orders Placed This Encounter  Procedures  . Comprehensive metabolic panel  . POCT Urinalysis Dipstick  Meds ordered this encounter  Medications  . furosemide (LASIX) 20  MG tablet    Sig: Take one tab every other day    Dispense:  30 tablet    Refill:  1    Time spent: 25 Minutes  This patient was seen by Blima LedgerAdam Henson Fraticelli AGNP-C in Collaboration with Dr Lyndon CodeFozia M Khan as a part of collaborative care agreement    Johnna AcostaAdam J. Sultan Pargas AGNP-C Internal medicine

## 2018-07-06 NOTE — Patient Instructions (Signed)
Hypoxia Hypoxia is a condition that happens when there is a lack of oxygen in the body's tissues and organs. When there is not enough oxygen, organs cannot work as they should. This causes serious problems throughout the body and in the brain. What are the causes? This condition may be caused by:  Exposure to high altitude.  A collapsed lung (pneumothorax).  Lung infection (pneumonia).  Lung injury.  Long-term (chronic) lung disease, such as COPD (chronic obstructive pulmonary disease).  Blood collecting in the chest cavity (hemothorax).  Food, saliva, or vomit getting into the airway (aspiration).  Reduced blood flow (ischemia).  Severe blood loss.  Slow or shallow breathing (hypoventilation).  Blood disorders, such as anemia.  Carbon monoxide poisoning.  The heart suddenly stopping (cardiac arrest).  Anesthetic medicines.  Drowning.  Choking.  What are the signs or symptoms? Symptoms of this condition include:  Headache.  Fatigue.  Drowsiness.  Forgetfulness.  Nausea.  Confusion.  Shortness of breath.  Dizziness.  Bluish color of the skin, lips, or nail beds (cyanosis).  Change in consciousness or awareness.  If hypoxia is not treated, it can lead to convulsions, loss of consciousness (coma), or brain damage. How is this diagnosed? This condition may be diagnosed based on:  A physical exam.  Blood tests.  A test that measures how much oxygen is in your blood (pulse oximetry). This is done with a sensor that is placed on your finger, toe, or earlobe.  Chest X-ray.  Tests to check your lung function (pulmonary function tests).  A test to check the electrical activity of your heart (electrocardiogram, ECG).  You may have other tests to determine the cause of your hypoxia. How is this treated? Treatment for this condition depends on what is causing the hypoxia. You will likely be treated with oxygen therapy. This may be done by giving you  oxygen through a face mask or through tubes in your nose. Your health care provider may also recommend other therapies to treat the underlying cause of your hypoxia. Follow these instructions at home:  Take over-the-counter and prescription medicines only as told by your health care provider.  Do not use any products that contain nicotine or tobacco, such as cigarettes and e-cigarettes. If you need help quitting, ask your health care provider.  Avoid secondhand smoke.  Work with your health care provider to manage any chronic conditions you have that may be causing hypoxia, such as COPD.  Keep all follow-up visits as told by your health care provider. This is important. Contact a health care provider if:  You have a fever.  You have trouble breathing, even after treatment.  You become extremely short of breath when you exercise. Get help right away if:  Your shortness of breath gets worse, especially with normal or very little activity.  Your skin, lips, or nail beds have a bluish color.  You become confused or you cannot think properly.  You have chest pain. Summary  Hypoxia is a condition that happens when there is a lack of oxygen in the body's tissues and organs.  If hypoxia is not treated, it can lead to convulsions, loss of consciousness (coma), or brain damage.  Symptoms of hypoxia can include a headache, shortness of breath, confusion, nausea, and a bluish skin color.  Hypoxia has many possible causes, including exposure to high altitude, carbon monoxide poisoning, or other health issues, such as blood disorders or cardiac arrest.  Hypoxia is usually treated with oxygen therapy. This information   is not intended to replace advice given to you by your health care provider. Make sure you discuss any questions you have with your health care provider. Document Released: 09/08/2016 Document Revised: 09/08/2016 Document Reviewed: 09/08/2016 Elsevier Interactive Patient  Education  2018 [employer]  

## 2018-07-07 ENCOUNTER — Telehealth: Payer: Self-pay

## 2018-07-07 LAB — COMPREHENSIVE METABOLIC PANEL
ALBUMIN: 3.9 g/dL (ref 3.5–4.7)
ALT: 56 IU/L — ABNORMAL HIGH (ref 0–32)
AST: 22 IU/L (ref 0–40)
Albumin/Globulin Ratio: 1.4 (ref 1.2–2.2)
Alkaline Phosphatase: 135 IU/L — ABNORMAL HIGH (ref 39–117)
BUN/Creatinine Ratio: 31 — ABNORMAL HIGH (ref 12–28)
BUN: 17 mg/dL (ref 8–27)
Bilirubin Total: <0.2 mg/dL (ref 0.0–1.2)
CALCIUM: 9.3 mg/dL (ref 8.7–10.3)
CO2: 22 mmol/L (ref 20–29)
CREATININE: 0.55 mg/dL — AB (ref 0.57–1.00)
Chloride: 101 mmol/L (ref 96–106)
GFR, EST AFRICAN AMERICAN: 102 mL/min/{1.73_m2} (ref >59)
GFR, EST NON AFRICAN AMERICAN: 89 mL/min/{1.73_m2} (ref >59)
Globulin, Total: 2.8 g/dL (ref 1.5–4.5)
Glucose: 194 mg/dL — ABNORMAL HIGH (ref 65–99)
Potassium: 3.9 mmol/L (ref 3.5–5.2)
Sodium: 140 mmol/L (ref 134–144)
TOTAL PROTEIN: 6.7 g/dL (ref 6.0–8.5)

## 2018-07-07 NOTE — Telephone Encounter (Signed)
[facility]ave [facility] order for  Nightly O2. Beth

## 2018-08-06 ENCOUNTER — Telehealth: Payer: Self-pay

## 2018-08-06 NOTE — Telephone Encounter (Signed)
Pt advised we working on O2

## 2018-08-10 ENCOUNTER — Other Ambulatory Visit: Payer: Self-pay | Admitting: Internal Medicine

## 2018-08-10 ENCOUNTER — Telehealth: Payer: Self-pay | Admitting: Internal Medicine

## 2018-08-10 NOTE — Telephone Encounter (Signed)
Please check note on 12/03

## 2018-08-11 ENCOUNTER — Other Ambulatory Visit: Payer: Self-pay | Admitting: Nurse Practitioner

## 2018-08-11 DIAGNOSIS — F411 Generalized anxiety disorder: Secondary | ICD-10-CM

## 2018-08-11 DIAGNOSIS — M150 Primary generalized (osteo)arthritis: Secondary | ICD-10-CM

## 2018-08-11 MED ORDER — CLORAZEPATE DIPOTASSIUM 3.75 MG PO TABS: ORAL_TABLET | ORAL | 1 refills | Status: DC

## 2018-08-11 MED ORDER — TRAMADOL HCL 50 MG PO TABS: 50.0000 mg | ORAL_TABLET | Freq: Four times a day (QID) | ORAL | 0 refills | Status: DC | PRN

## 2018-08-11 NOTE — Progress Notes (Signed)
Refilled clorazepate for 30 days with additional refill.

## 2018-08-11 NOTE — Telephone Encounter (Signed)
Refilled clorazepate for 30 days with additional refill.

## 2018-08-11 NOTE — Progress Notes (Signed)
Sent single 30 day prescription for tramadol 50mg  [facility] [place] per [facility] request. Patient has next appointment 08/30/2018

## 2018-08-11 NOTE — Telephone Encounter (Signed)
Can you send

## 2018-08-22 ENCOUNTER — Other Ambulatory Visit: Payer: Self-pay | Admitting: Adult Health

## 2018-08-22 DIAGNOSIS — M62838 Other muscle spasm: Secondary | ICD-10-CM

## 2018-08-26 ENCOUNTER — Other Ambulatory Visit: Payer: Self-pay

## 2018-08-26 MED ORDER — METOPROLOL SUCCINATE ER 25 MG PO TB24: ORAL_TABLET | ORAL | 3 refills | Status: DC

## 2018-08-31 ENCOUNTER — Telehealth: Payer: Self-pay

## 2018-08-31 ENCOUNTER — Ambulatory Visit: Payer: Self-pay | Admitting: Adult Health

## 2018-08-31 ENCOUNTER — Other Ambulatory Visit: Payer: Self-pay | Admitting: Adult Health

## 2018-08-31 MED ORDER — DIPHENOXYLATE-ATROPINE 2.5-0.025 MG PO TABS: 1.0000 | ORAL_TABLET | Freq: Two times a day (BID) | ORAL | 0 refills | Status: DC | PRN

## 2018-08-31 NOTE — Telephone Encounter (Signed)
Pt advised we send lomotil to phar also drink lots of water

## 2018-08-31 NOTE — Progress Notes (Signed)
RX for lomotil sent to patients pharmacy.

## 2018-09-03 ENCOUNTER — Telehealth: Payer: Self-pay

## 2018-09-03 ENCOUNTER — Other Ambulatory Visit: Payer: Self-pay

## 2018-09-03 ENCOUNTER — Other Ambulatory Visit: Payer: Self-pay | Admitting: Adult Health

## 2018-09-03 MED ORDER — DIPHENOXYLATE-ATROPINE 2.5-0.025 MG PO TABS: 1.0000 | ORAL_TABLET | Freq: Two times a day (BID) | ORAL | 0 refills | Status: DC | PRN

## 2018-09-03 MED ORDER — METRONIDAZOLE 250 MG PO TABS: 250.0000 mg | ORAL_TABLET | Freq: Two times a day (BID) | ORAL | 0 refills | Status: AC

## 2018-09-03 MED ORDER — OXYBUTYNIN CHLORIDE 5 MG PO TABS: 5.0000 mg | ORAL_TABLET | Freq: Every day | ORAL | 3 refills | Status: DC

## 2018-09-03 NOTE — Telephone Encounter (Signed)
Send reminder 

## 2018-09-03 NOTE — Progress Notes (Signed)
Lomotil and Flagyl sent for patient, for diarrhea.

## 2018-09-03 NOTE — Telephone Encounter (Signed)
Pt  Caregiver advised we send pres  Lomotil and flagyl and if she not feeling better she need to go to ER and also I make sure she is not taking miralax

## 2018-09-04 ENCOUNTER — Other Ambulatory Visit: Payer: Self-pay | Admitting: Adult Health

## 2018-09-21 DIAGNOSIS — Z8673 Personal history of transient ischemic attack (TIA), and cerebral infarction without residual deficits: Secondary | ICD-10-CM | POA: Diagnosis not present

## 2018-09-21 DIAGNOSIS — G20 Parkinson's disease: Secondary | ICD-10-CM | POA: Diagnosis not present

## 2018-09-30 IMAGING — CR DG HIP (WITH OR WITHOUT PELVIS) 2-3V*L*
1 series · 3 of 3 positions shown · non-contrast
Comparison: AP pelvis from a right hip series of March 31, 2014

CLINICAL DATA: Sitting on the toilet the patient had spontaneous
epistaxis. The patient sat there for several hours and subsequently
developed left hip pain. History of Parkinson's disease.

EXAM:
DG HIP (WITH OR WITHOUT PELVIS) 2-3V LEFT

[Series 1: dg hip unilat w or w/o pelvis 2-3 views  · non-contrast · 0.14mm/px · 3 of 3 slices shown]
[im 1/3]
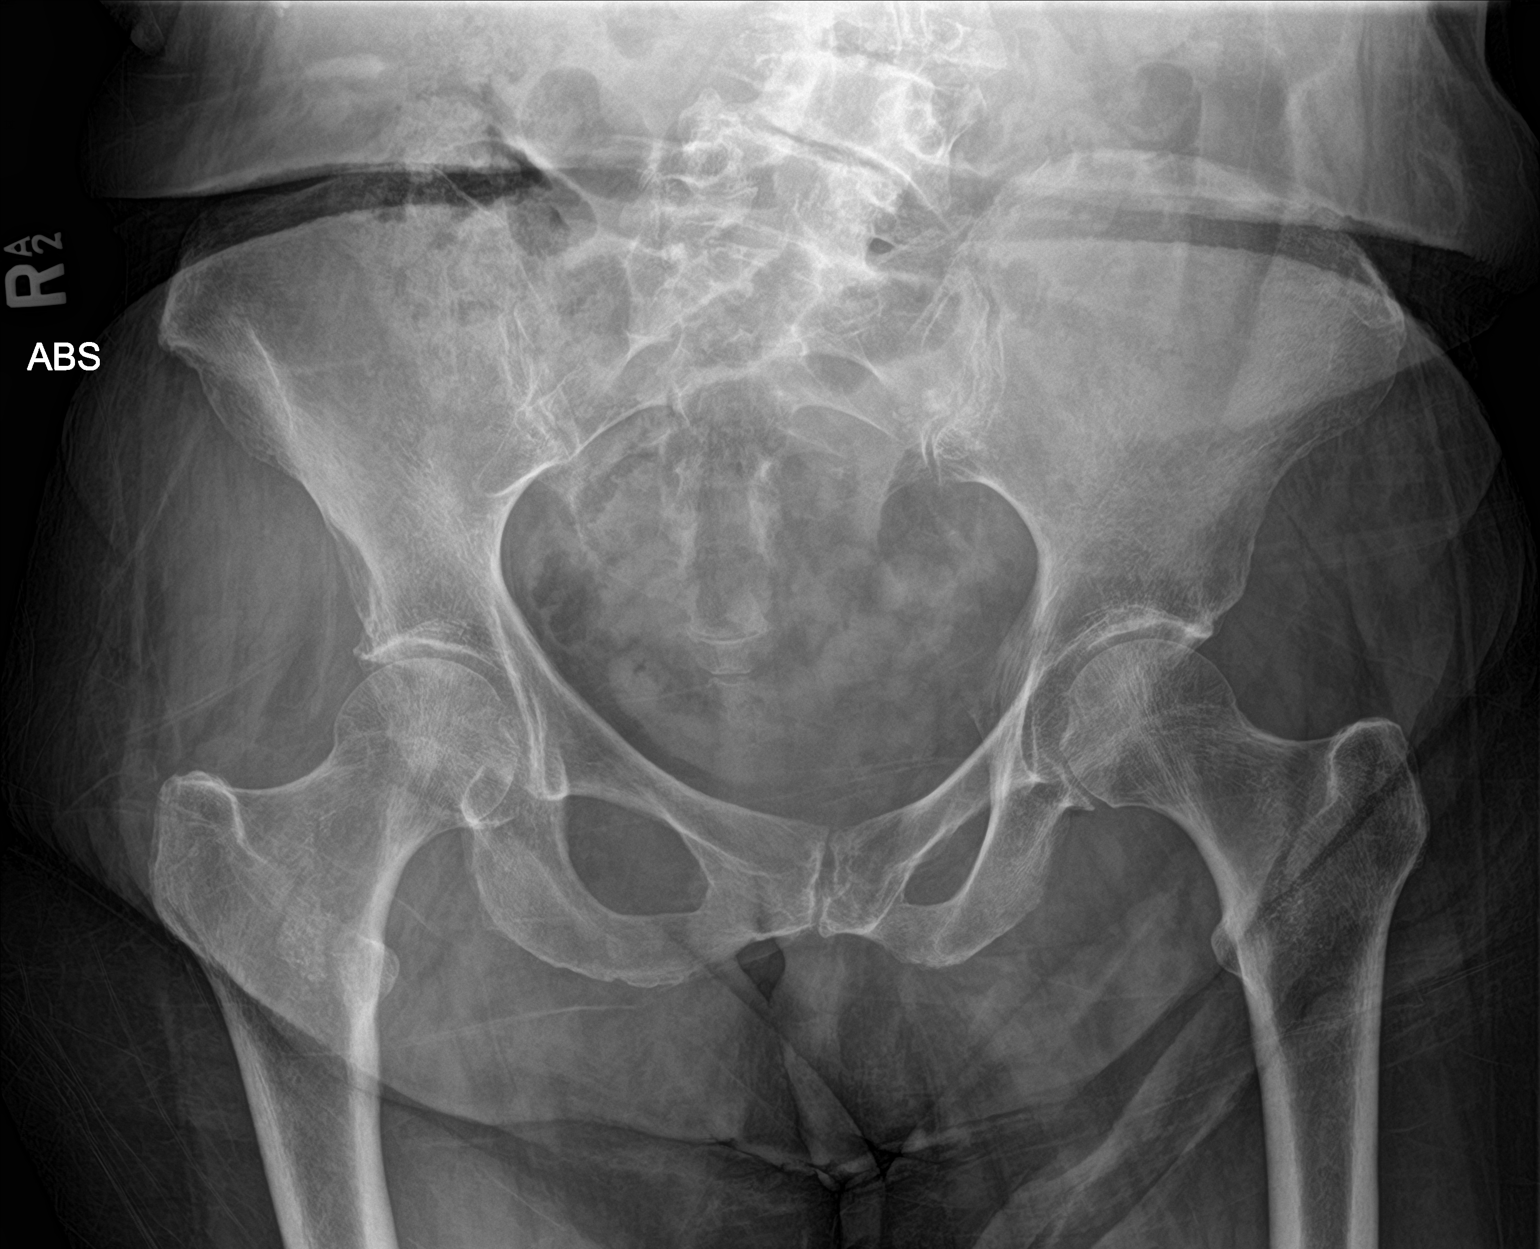
[im 2/3]
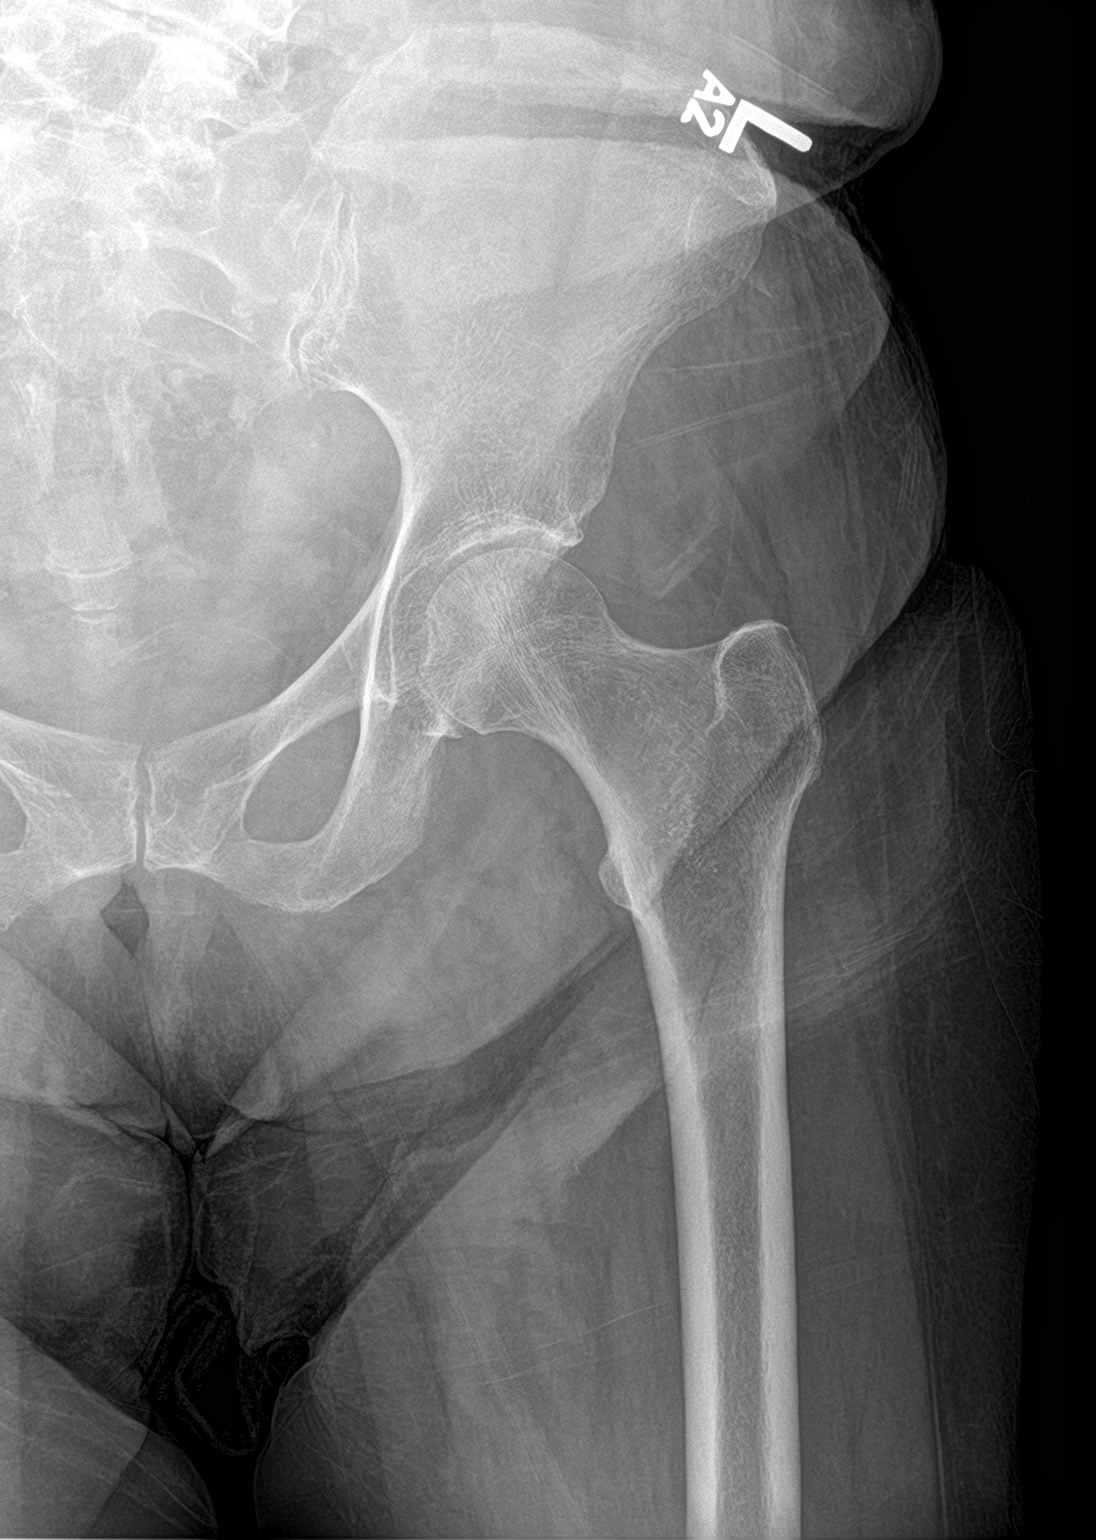
[im 3/3]
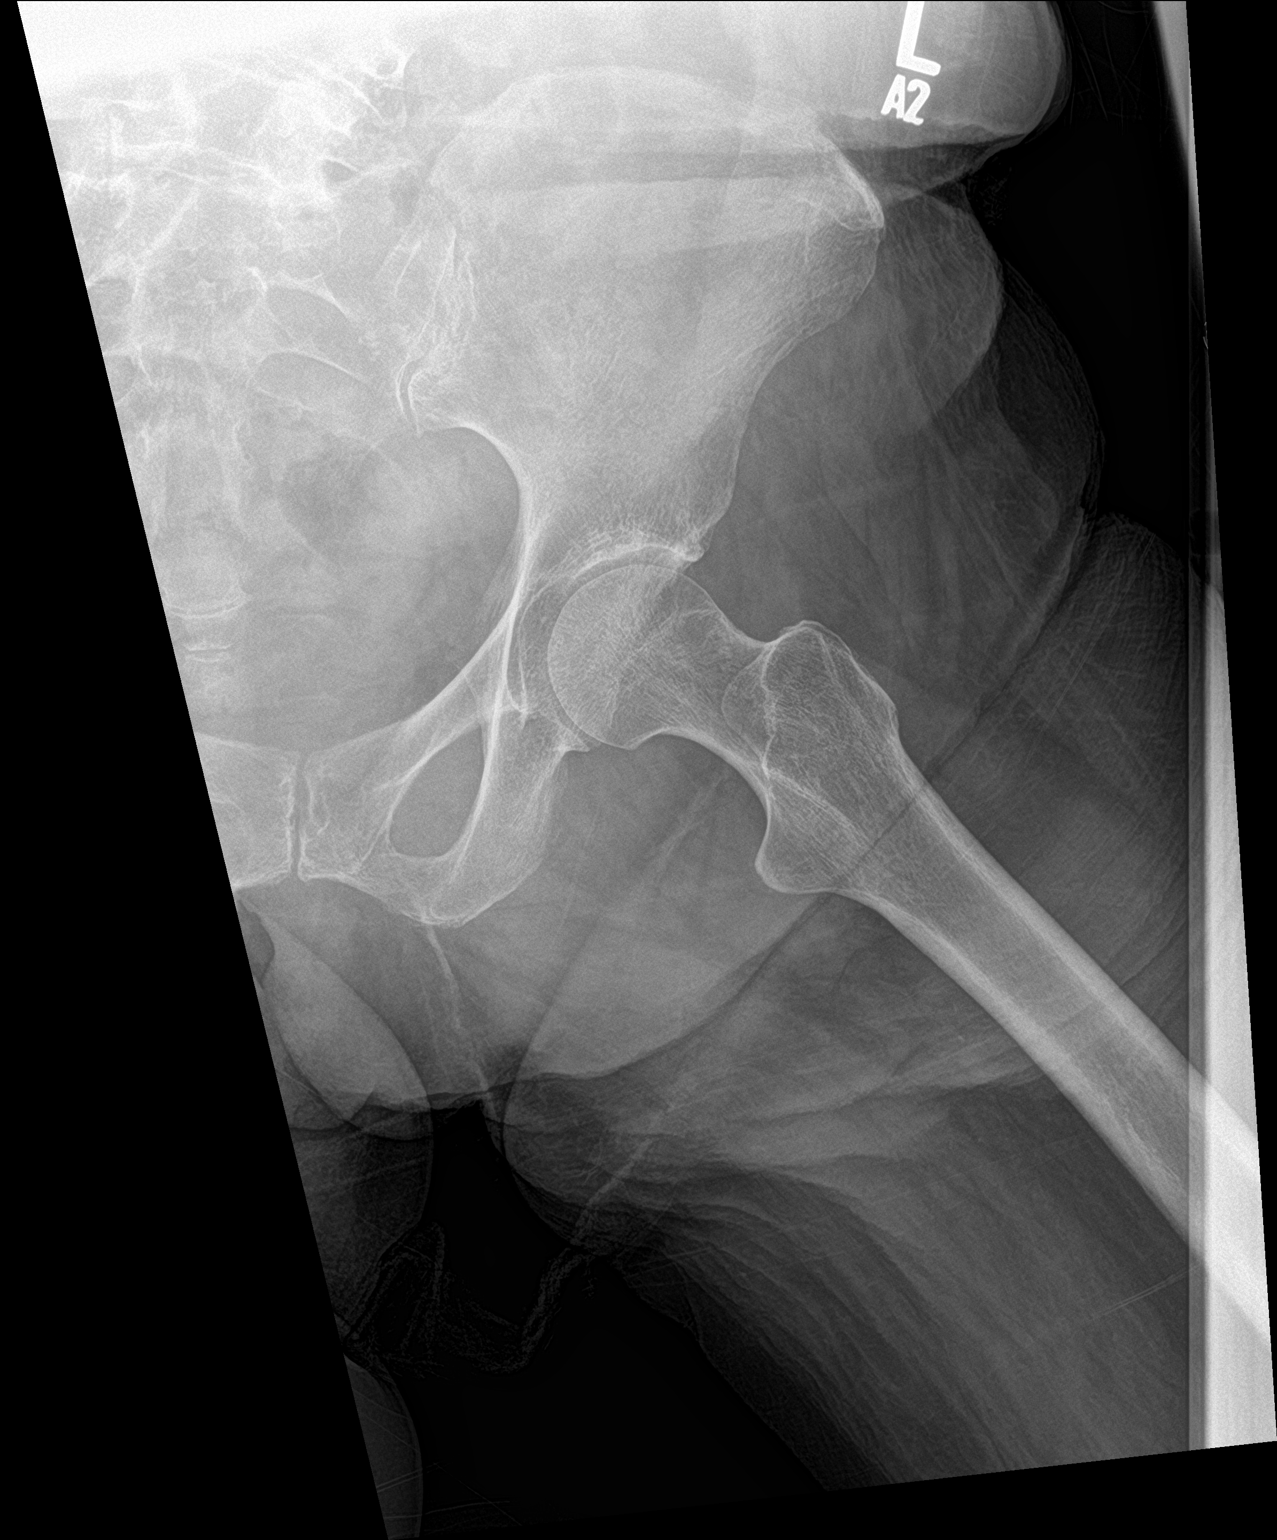

[3 of 3 positions shown; findings below may reference images not displayed]

FINDINGS: The bony pelvis is subjectively adequately mineralized. There is no
lytic nor blastic lesion. AP and lateral views of the left hip
reveal mild symmetric narrowing of the joint space. The articular
surfaces of the femoral head and acetabulum remains smoothly
rounded. The femoral neck, intertrochanteric, and subtrochanteric
regions are normal.
IMPRESSION: There is no acute or significant chronic bony abnormality of the
left hip.

## 2018-11-02 ENCOUNTER — Other Ambulatory Visit: Payer: Self-pay | Admitting: Nurse Practitioner

## 2018-11-02 DIAGNOSIS — M150 Primary generalized (osteo)arthritis: Secondary | ICD-10-CM

## 2018-11-02 MED ORDER — TRAMADOL HCL 50 MG PO TABS: 50.0000 mg | ORAL_TABLET | Freq: Four times a day (QID) | ORAL | 1 refills | Status: DC | PRN

## 2018-11-02 NOTE — Progress Notes (Signed)
Approved tramadol per pharmacy request.

## 2018-11-04 ENCOUNTER — Telehealth: Payer: Self-pay

## 2018-11-04 ENCOUNTER — Other Ambulatory Visit: Payer: Self-pay | Admitting: Adult Health

## 2018-11-04 NOTE — Telephone Encounter (Signed)
spoke with pt advised need follow up appt in 2 months she said she will call us back

## 2018-11-10 ENCOUNTER — Other Ambulatory Visit: Payer: Self-pay | Admitting: Internal Medicine

## 2018-11-16 ENCOUNTER — Other Ambulatory Visit: Payer: Self-pay

## 2018-11-16 MED ORDER — CLOTRIMAZOLE-BETAMETHASONE 1-0.05 % EX CREA: TOPICAL_CREAM | Freq: Two times a day (BID) | CUTANEOUS | 0 refills | Status: DC

## 2018-11-22 ENCOUNTER — Other Ambulatory Visit: Payer: Self-pay

## 2018-11-22 MED ORDER — PANTOPRAZOLE SODIUM 40 MG PO TBEC: DELAYED_RELEASE_TABLET | ORAL | 3 refills | Status: DC

## 2018-12-20 ENCOUNTER — Other Ambulatory Visit: Payer: Self-pay

## 2018-12-20 MED ORDER — METOPROLOL SUCCINATE ER 25 MG PO TB24: ORAL_TABLET | ORAL | 1 refills | Status: DC

## 2018-12-21 ENCOUNTER — Other Ambulatory Visit: Payer: Self-pay | Admitting: Internal Medicine

## 2018-12-26 ENCOUNTER — Other Ambulatory Visit: Payer: Self-pay | Admitting: Adult Health

## 2019-02-03 ENCOUNTER — Other Ambulatory Visit: Payer: Self-pay

## 2019-02-03 MED ORDER — CLOTRIMAZOLE-BETAMETHASONE 1-0.05 % EX CREA: TOPICAL_CREAM | Freq: Two times a day (BID) | CUTANEOUS | 0 refills | Status: DC

## 2019-02-16 ENCOUNTER — Other Ambulatory Visit: Payer: Self-pay | Admitting: Internal Medicine

## 2019-02-22 ENCOUNTER — Other Ambulatory Visit: Payer: Self-pay | Admitting: Adult Health

## 2019-03-15 ENCOUNTER — Other Ambulatory Visit: Payer: Self-pay

## 2019-03-15 MED ORDER — CLOTRIMAZOLE 1 % EX CREA: TOPICAL_CREAM | CUTANEOUS | 2 refills | Status: DC

## 2019-03-16 ENCOUNTER — Other Ambulatory Visit: Payer: Self-pay | Admitting: Adult Health

## 2019-03-29 ENCOUNTER — Ambulatory Visit: Payer: Self-pay | Admitting: Nurse Practitioner

## 2019-04-13 ENCOUNTER — Other Ambulatory Visit: Payer: Self-pay

## 2019-04-13 MED ORDER — FUROSEMIDE 20 MG PO TABS: 20.0000 mg | ORAL_TABLET | ORAL | 1 refills | Status: DC

## 2019-04-15 ENCOUNTER — Other Ambulatory Visit: Payer: Self-pay

## 2019-04-15 DIAGNOSIS — M62838 Other muscle spasm: Secondary | ICD-10-CM

## 2019-04-15 MED ORDER — CLOTRIMAZOLE-BETAMETHASONE 1-0.05 % EX CREA: TOPICAL_CREAM | Freq: Two times a day (BID) | CUTANEOUS | 0 refills | Status: DC

## 2019-04-15 MED ORDER — METHOCARBAMOL 750 MG PO TABS: 750.0000 mg | ORAL_TABLET | Freq: Four times a day (QID) | ORAL | 0 refills | Status: DC | PRN

## 2019-04-20 ENCOUNTER — Other Ambulatory Visit: Payer: Self-pay | Admitting: Internal Medicine

## 2019-04-20 ENCOUNTER — Other Ambulatory Visit: Payer: Self-pay

## 2019-04-20 MED ORDER — METOPROLOL SUCCINATE ER 25 MG PO TB24: ORAL_TABLET | ORAL | 1 refills | Status: DC

## 2019-04-20 MED ORDER — HYDROCORTISONE (PERIANAL) 2.5 % EX CREA: TOPICAL_CREAM | CUTANEOUS | 0 refills | Status: DC

## 2019-05-19 ENCOUNTER — Other Ambulatory Visit: Payer: Self-pay | Admitting: Nurse Practitioner

## 2019-05-19 MED ORDER — GABAPENTIN 100 MG PO CAPS: ORAL_CAPSULE | ORAL | 0 refills | Status: DC

## 2019-05-19 MED ORDER — METOPROLOL SUCCINATE ER 25 MG PO TB24: ORAL_TABLET | ORAL | 0 refills | Status: DC

## 2019-05-19 NOTE — Telephone Encounter (Signed)
Advised on keeping follow up next month for further refills

## 2019-06-14 ENCOUNTER — Other Ambulatory Visit: Payer: Self-pay

## 2019-06-14 ENCOUNTER — Ambulatory Visit (INDEPENDENT_AMBULATORY_CARE_PROVIDER_SITE_OTHER): Payer: Medicare Other | Admitting: Nurse Practitioner

## 2019-06-14 ENCOUNTER — Encounter: Payer: Self-pay | Admitting: Nurse Practitioner

## 2019-06-14 VITALS — BP 152/81 | HR 80 | Temp 97.4°F | Resp 16 | Ht 63.0 in | Wt 154.2 lb

## 2019-06-14 DIAGNOSIS — M150 Primary generalized (osteo)arthritis: Secondary | ICD-10-CM | POA: Diagnosis not present

## 2019-06-14 DIAGNOSIS — E538 Deficiency of other specified B group vitamins: Secondary | ICD-10-CM | POA: Diagnosis not present

## 2019-06-14 DIAGNOSIS — G20 Parkinson's disease: Secondary | ICD-10-CM

## 2019-06-14 DIAGNOSIS — I10 Essential (primary) hypertension: Secondary | ICD-10-CM | POA: Diagnosis not present

## 2019-06-14 DIAGNOSIS — Z0001 Encounter for general adult medical examination with abnormal findings: Secondary | ICD-10-CM | POA: Diagnosis not present

## 2019-06-14 MED ORDER — TRAMADOL HCL 50 MG PO TABS: 50.0000 mg | ORAL_TABLET | Freq: Four times a day (QID) | ORAL | 1 refills | Status: DC | PRN

## 2019-06-14 MED ORDER — CYANOCOBALAMIN 1000 MCG/ML IJ SOLN
1000.0000 ug | Freq: Once | INTRAMUSCULAR | Status: AC
  Administered 2019-06-14: 1000 ug via INTRAMUSCULAR

## 2019-06-14 NOTE — Progress Notes (Signed)
[facility] [address] [place], [place] [address]  Internal MEDICINE  Office Visit Note  Patient Name: Jennifer RiseDoris R Snyder  [id number]  [id number]  Date of Service: 07/01/2019   Pt is here for routine health maintenance examination    Chief Complaint  Patient presents with  . Annual Exam  . Hypertension  . Hyperlipidemia  . Medication Refill    tramadol  . Ear Pain  . Quality Metric Gaps    pna vacc  . Injections    is b12 injection ok to give?     The patient is here for health maintenance exam. Today, she has intermittent "twinges" of pain in both ears. She states this has been going on for past 6 months. She also states that she has had a lot of trouble with her teeth. Started falling out, one by one, around March 20. Does get intermittent pain with this too. She is seeing dentist and oral surgeon for this right now.   Current Medication: Outpatient Encounter Medications as of 06/14/2019  Medication Sig  . acetaminophen (TYLENOL) 325 MG tablet Take 325 mg by mouth every 6 (six) hours as needed.   Marland Kitchen. aspirin 81 MG chewable tablet Chew by mouth.  . carbidopa-levodopa (SINEMET) 25-100 MG tablet Take 1 tablet by mouth 2 (two) times daily.   . Carbidopa-Levodopa ER (SINEMET CR) 25-100 MG tablet controlled release Take 1 tablet by mouth daily.   . Cholecalciferol (VITAMIN D3 PO) Take by mouth daily.  . clorazepate (TRANXENE) 3.75 MG tablet TAKE 1 TABLET BY MOUTH 1 TO 3 TIMES A DAY  . clotrimazole-betamethasone (LOTRISONE) cream Apply topically 2 (two) times daily.  . dibucaine (NUPERCAINAL) 1 % ointment PLACE ONE APPLICATION RECTALLY AS NEEDED FOR HEMORRHOIDS  . diphenoxylate-atropine (LOMOTIL) 2.5-0.025 MG tablet Take 1 tablet by mouth 2 (two) times daily as needed for diarrhea or loose stools.  . furosemide (LASIX) 20 MG tablet Take 1 tablet (20 mg total) by mouth every other day.  . hydrocortisone (PROCTOSOL HC) 2.5 % rectal cream Apply to affected area daily for  2 weeks as needed for hemorrhoids  . loratadine (ALLERGY RELIEF) 10 MG tablet Take 10 mg by mouth daily.  . methocarbamol (ROBAXIN) 750 MG tablet Take 1 tablet (750 mg total) by mouth every 6 (six) hours as needed for muscle spasms.  . mometasone (ELOCON) 0.1 % cream APPLY TO AFFECTED AREA TWICE DAILY  . oxybutynin (DITROPAN) 5 MG tablet TAKE 1 TABLET BY MOUTH EVERY DAY  . pantoprazole (PROTONIX) 40 MG tablet TAKE 1 TABLET BY MOUTH DAILY FOR GERD  . polyethylene glycol (MIRALAX / GLYCOLAX) packet Take by mouth.  Marland Kitchen. rOPINIRole (REQUIP) 0.25 MG tablet ONE TO 3 TABS A DAY AS DIRECTED (Patient taking differently: Take 0.25 mg by mouth 2 (two) times daily. )  . traMADol (ULTRAM) 50 MG tablet Take 1 tablet (50 mg total) by mouth every 6 (six) hours as needed. for pain  . [DISCONTINUED] gabapentin (NEURONTIN) 100 MG capsule TAKE ONE CAPSULE BY MOUTH 3 TIMES A DAY AS NEEDED FOR LEG AND NERVE PAIN  . [DISCONTINUED] metoprolol succinate (TOPROL-XL) 25 MG 24 hr tablet Start with half tab a day and may increase to one tab daily  . [DISCONTINUED] traMADol (ULTRAM) 50 MG tablet Take 1 tablet (50 mg total) by mouth every 6 (six) hours as needed. for pain  . [DISCONTINUED] ciprofloxacin-dexamethasone (CIPRODEX) OTIC suspension Place 4 drops into both ears 2 (two) times daily. (Patient not taking: Reported on 06/14/2019)  . [  DISCONTINUED] clotrimazole (LOTRIMIN) 1 % cream APPLY TO AFFECTED AREA TWICE A DAY FOR FUNGAL RASH (Patient not taking: Reported on 06/14/2019)  . [DISCONTINUED] nitrofurantoin, macrocrystal-monohydrate, (MACROBID) 100 MG capsule Take 1 capsule (100 mg total) by mouth 2 (two) times daily. (Patient not taking: Reported on 06/14/2019)  . [DISCONTINUED] sulfamethoxazole-trimethoprim (BACTRIM) 400-80 MG tablet Take 1 tablet by mouth 2 (two) times daily. (Patient not taking: Reported on 06/14/2019)  . [EXPIRED] cyanocobalamin ((VITAMIN B-12)) injection 1,000 mcg    No facility-administered encounter  medications on file as of 06/14/2019.     Surgical History: Past Surgical History:  Procedure Laterality Date  . ABDOMINAL HERNIA REPAIR    . ADENOIDECTOMY  1994  . CATARACT EXTRACTION, BILATERAL  09/2014, 07/2014  . child birth  1958, 1961  . CHOLECYSTECTOMY  1989  . laser surgery on both eyes  11/2016  . left knee replacement  1993  . para esophogeal hernia repair    . TONSILLECTOMY  1944  . VAGINAL HYSTERECTOMY  1976    Medical History: Past Medical History:  Diagnosis Date  . Hyperlipidemia   . Hypertension   . Osteoarthritis   . Parkinson's disease ([place])   . Peptic ulcer disease     Family History: Family History  Problem Relation Age of Onset  . Lung cancer Mother   . Hyperlipidemia Mother   . Hypertension Mother   . Osteoarthritis Mother   . Stroke Mother       Review of Systems  Constitutional: Positive for fatigue. Negative for chills and unexpected weight change.  HENT: Positive for ear pain. Negative for congestion, rhinorrhea, sneezing and sore throat.   Respiratory: Negative for cough, chest tightness, shortness of breath and wheezing.   Cardiovascular: Negative for chest pain and palpitations.  Gastrointestinal: Negative for abdominal pain, constipation, diarrhea, nausea and vomiting.  Endocrine: Negative for cold intolerance, heat intolerance, polydipsia and polyuria.  Genitourinary: Negative for dysuria, frequency, hematuria and urgency.  Musculoskeletal: Positive for gait problem. Negative for arthralgias, back pain, joint swelling and neck pain.  Skin: Negative for rash.  Allergic/Immunologic: Negative for environmental allergies.  Neurological: Positive for dizziness and weakness. Negative for tremors and numbness.  Hematological: Negative for adenopathy. Does not bruise/bleed easily.  Psychiatric/Behavioral: Negative for behavioral problems and sleep disturbance. The patient is nervous/anxious.      Today's Vitals   06/14/19 1030  BP: (!)  152/81  Pulse: 80  Resp: 16  Temp: (!) 97.4 F (36.3 C)  SpO2: 95%  Weight: 154 lb 3.2 oz (69.9 kg)  Height: 5\' 3"  (1.6 m)   Body mass index is 27.32 kg/m.  Physical Exam Vitals signs and nursing note reviewed.  Constitutional:      General: She is not in acute distress.    Appearance: Normal appearance. She is well-developed. She is not diaphoretic.  HENT:     Head: Normocephalic and atraumatic.     Right Ear: External ear normal.     Left Ear: External ear normal.     Nose: Nose normal.     Mouth/Throat:     Pharynx: No oropharyngeal exudate.  Eyes:     Extraocular Movements: Extraocular movements intact.     Pupils: Pupils are equal, round, and reactive to light.  Neck:     Musculoskeletal: Normal range of motion and neck supple.     Thyroid: No thyromegaly.     Vascular: No carotid bruit or JVD.     Trachea: No tracheal deviation.  Cardiovascular:  Rate and Rhythm: Normal rate and regular rhythm.     Pulses: Normal pulses.     Heart sounds: Normal heart sounds. No murmur. No friction rub. No gallop.   Pulmonary:     Effort: Pulmonary effort is normal. No respiratory distress.     Breath sounds: Normal breath sounds. No wheezing.  Chest:     Chest wall: No tenderness.  Abdominal:     General: Bowel sounds are normal.     Palpations: Abdomen is soft.     Tenderness: There is no abdominal tenderness. There is no guarding.  Musculoskeletal: Normal range of motion.  Lymphadenopathy:     Cervical: No cervical adenopathy.  Skin:    General: Skin is warm and dry.  Neurological:     Mental Status: She is alert and oriented to person, place, and time. Mental status is at baseline.     Cranial Nerves: No cranial nerve deficit.  Psychiatric:        Mood and Affect: Mood normal.        Behavior: Behavior normal.        Thought Content: Thought content normal.        Judgment: Judgment normal.    Depression screen [facility] 2/9 06/14/2019 05/26/2018 03/24/2018 12/07/2017  09/02/2017  Decreased Interest 0 0 0 0 0  Down, Depressed, Hopeless 0 0 0 0 0  PHQ - 2 Score 0 0 0 0 0    Functional Status Survey: Is the patient deaf or have difficulty hearing?: Yes Does the patient have difficulty seeing, even when wearing glasses/contacts?: No Does the patient have difficulty concentrating, remembering, or making decisions?: Yes(a little bit of problem remembering) Does the patient have difficulty walking or climbing stairs?: Yes Does the patient have difficulty dressing or bathing?: Yes Does the patient have difficulty doing errands alone such as visiting a doctor's office or shopping?: Yes  MMSE - Mini Mental State Exam 06/14/2019 03/24/2018  Orientation to time 5 5  Orientation to Place 5 5  Registration 3 3  Attention/ Calculation 5 5  Recall 3 3  Language- name 2 objects 2 2  Language- repeat 1 1  Language- follow 3 step command 3 3  Language- read & follow direction 1 1  Write a sentence 1 0  Copy design 1 1  Total score 30 29    Fall Risk  06/14/2019 05/26/2018 03/24/2018 12/07/2017 09/02/2017  Falls in the past year? 0 No Yes Yes -  Number falls in past yr: - - 1 2 or more 2 or more  Injury with Fall? - - - No No    Assessment/Plan: 1. Encounter for general adult medical examination with abnormal findings Annual health maintenance exam today.   2. Essential hypertension Stable. Continue bp medication as prescribed   3. Parkinson disease (HCC) Continue regular visits with neurology as scheduled.   4. Primary generalized hypertrophic osteoarthrosis May take tramadol 50mg  as needed and as prescribed. Refills sent to her pharmacy today.  - traMADol (ULTRAM) 50 MG tablet; Take 1 tablet (50 mg total) by mouth every 6 (six) hours as needed. for pain  Dispense: 90 tablet; Refill: 1  5. B12 deficiency Vitamin b12 injection administered today.  - cyanocobalamin ((VITAMIN B-12)) injection 1,000 mcg  General Counseling: Neesha verbalizes understanding  of the findings of todays visit and agrees with plan of treatment. I have discussed any further diagnostic evaluation that may be needed or ordered today. We also reviewed her medications today. she has  been encouraged to call the office with any questions or concerns that should arise related to todays visit.    Counseling:   Hypertension Counseling:   The following hypertensive lifestyle modification were recommended and discussed:  1. Limiting alcohol intake to less than 1 oz/day of ethanol:(24 oz of beer or 8 oz of wine or 2 oz of 100-proof whiskey). 2. Take baby ASA 81 mg daily. 3. Importance of regular aerobic exercise and losing weight. 4. Reduce dietary saturated fat and cholesterol intake for overall cardiovascular health. 5. Maintaining adequate dietary potassium, calcium, and magnesium intake. 6. Regular monitoring of the blood pressure. 7. Reduce sodium intake to less than 100 mmol/day (less than 2.3 gm of sodium or less than 6 gm of sodium choride)   This patient was seen by Vincent Gros FNP Collaboration with Dr Lyndon Code as a part of collaborative care agreement  Meds ordered this encounter  Medications  . traMADol (ULTRAM) 50 MG tablet    Sig: Take 1 tablet (50 mg total) by mouth every 6 (six) hours as needed. for pain    Dispense:  90 tablet    Refill:  1    Order Specific Question:   Supervising Provider    Answer:   Lyndon Code [[id number]]  . cyanocobalamin ((VITAMIN B-12)) injection 1,000 mcg    Time spent: 13 Minutes      Lyndon Code, MD  Internal Medicine

## 2019-06-20 ENCOUNTER — Other Ambulatory Visit: Payer: Self-pay

## 2019-06-20 MED ORDER — METOPROLOL SUCCINATE ER 25 MG PO TB24: ORAL_TABLET | ORAL | 3 refills | Status: DC

## 2019-06-23 ENCOUNTER — Other Ambulatory Visit: Payer: Self-pay

## 2019-06-23 MED ORDER — GABAPENTIN 100 MG PO CAPS: ORAL_CAPSULE | ORAL | 1 refills | Status: DC

## 2019-07-01 DIAGNOSIS — M150 Primary generalized (osteo)arthritis: Secondary | ICD-10-CM | POA: Insufficient documentation

## 2019-07-21 ENCOUNTER — Other Ambulatory Visit: Payer: Self-pay

## 2019-07-21 MED ORDER — PANTOPRAZOLE SODIUM 40 MG PO TBEC: DELAYED_RELEASE_TABLET | ORAL | 3 refills | Status: DC

## 2019-07-22 ENCOUNTER — Other Ambulatory Visit: Payer: Self-pay

## 2019-07-22 ENCOUNTER — Other Ambulatory Visit: Payer: Self-pay | Admitting: Nurse Practitioner

## 2019-07-22 DIAGNOSIS — F411 Generalized anxiety disorder: Secondary | ICD-10-CM

## 2019-07-22 MED ORDER — CLORAZEPATE DIPOTASSIUM 3.75 MG PO TABS: ORAL_TABLET | ORAL | 1 refills | Status: DC

## 2019-07-22 NOTE — Progress Notes (Signed)
Reordered clorazepate TID prn and sent to [facility] per request.

## 2019-08-11 ENCOUNTER — Other Ambulatory Visit: Payer: Self-pay | Admitting: Adult Health

## 2019-09-09 ENCOUNTER — Other Ambulatory Visit: Payer: Self-pay

## 2019-09-09 MED ORDER — GABAPENTIN 100 MG PO CAPS: ORAL_CAPSULE | ORAL | 1 refills | Status: DC

## 2019-09-09 MED ORDER — CLOTRIMAZOLE-BETAMETHASONE 1-0.05 % EX CREA: TOPICAL_CREAM | Freq: Two times a day (BID) | CUTANEOUS | 0 refills | Status: DC

## 2019-09-13 ENCOUNTER — Other Ambulatory Visit: Payer: Self-pay

## 2019-09-13 MED ORDER — CLOTRIMAZOLE-BETAMETHASONE 1-0.05 % EX CREA: TOPICAL_CREAM | Freq: Two times a day (BID) | CUTANEOUS | 0 refills | Status: DC

## 2019-09-15 ENCOUNTER — Telehealth: Payer: Self-pay

## 2019-09-15 NOTE — Telephone Encounter (Signed)
Patient cancelled appointment on 09/20/2019 wants to wait until receives  second covid vaccine, will call back to reschedule. klh

## 2019-09-15 NOTE — Telephone Encounter (Signed)
LMOM TO CONFIRM 09-20-19 OV.

## 2019-09-20 ENCOUNTER — Ambulatory Visit: Payer: Medicare Other | Admitting: Nurse Practitioner

## 2019-10-17 ENCOUNTER — Other Ambulatory Visit: Payer: Self-pay

## 2019-10-17 ENCOUNTER — Other Ambulatory Visit: Payer: Self-pay | Admitting: Adult Health

## 2019-10-17 MED ORDER — GABAPENTIN 100 MG PO CAPS: ORAL_CAPSULE | ORAL | 0 refills | Status: DC

## 2019-10-24 ENCOUNTER — Telehealth: Payer: Self-pay

## 2019-10-24 NOTE — Telephone Encounter (Signed)
Called informed patient need to reschedule missed 3 month fu,B12 appointment, patient will call back to reschedule appointment. klh

## 2019-11-09 ENCOUNTER — Emergency Department: Payer: Medicare Other

## 2019-11-09 ENCOUNTER — Inpatient Hospital Stay
Admission: EM | Admit: 2019-11-09 | Discharge: 2019-11-12 | DRG: 304 | Disposition: A | Discharge status: Skilled Nursing Facility | Payer: Medicare Other | Attending: Internal Medicine | Admitting: Internal Medicine

## 2019-11-09 ENCOUNTER — Other Ambulatory Visit: Payer: Self-pay

## 2019-11-09 DIAGNOSIS — J810 Acute pulmonary edema: Secondary | ICD-10-CM

## 2019-11-09 DIAGNOSIS — I160 Hypertensive urgency: Principal | ICD-10-CM | POA: Diagnosis present

## 2019-11-09 DIAGNOSIS — I110 Hypertensive heart disease with heart failure: Secondary | ICD-10-CM | POA: Diagnosis present

## 2019-11-09 DIAGNOSIS — J9601 Acute respiratory failure with hypoxia: Secondary | ICD-10-CM | POA: Diagnosis present

## 2019-11-09 DIAGNOSIS — E538 Deficiency of other specified B group vitamins: Secondary | ICD-10-CM | POA: Diagnosis present

## 2019-11-09 DIAGNOSIS — Z885 Allergy status to narcotic agent status: Secondary | ICD-10-CM

## 2019-11-09 DIAGNOSIS — M1990 Unspecified osteoarthritis, unspecified site: Secondary | ICD-10-CM | POA: Diagnosis present

## 2019-11-09 DIAGNOSIS — Z83438 Family history of other disorder of lipoprotein metabolism and other lipidemia: Secondary | ICD-10-CM

## 2019-11-09 DIAGNOSIS — Z801 Family history of malignant neoplasm of trachea, bronchus and lung: Secondary | ICD-10-CM

## 2019-11-09 DIAGNOSIS — I5031 Acute diastolic (congestive) heart failure: Secondary | ICD-10-CM | POA: Diagnosis present

## 2019-11-09 DIAGNOSIS — K219 Gastro-esophageal reflux disease without esophagitis: Secondary | ICD-10-CM | POA: Diagnosis present

## 2019-11-09 DIAGNOSIS — R531 Weakness: Secondary | ICD-10-CM

## 2019-11-09 DIAGNOSIS — G20 Parkinson's disease: Secondary | ICD-10-CM | POA: Diagnosis not present

## 2019-11-09 DIAGNOSIS — M150 Primary generalized (osteo)arthritis: Secondary | ICD-10-CM

## 2019-11-09 DIAGNOSIS — Z8249 Family history of ischemic heart disease and other diseases of the circulatory system: Secondary | ICD-10-CM

## 2019-11-09 DIAGNOSIS — Z20822 Contact with and (suspected) exposure to covid-19: Secondary | ICD-10-CM | POA: Diagnosis present

## 2019-11-09 DIAGNOSIS — L8990 Pressure ulcer of unspecified site, unspecified stage: Secondary | ICD-10-CM | POA: Insufficient documentation

## 2019-11-09 DIAGNOSIS — Z7189 Other specified counseling: Secondary | ICD-10-CM

## 2019-11-09 DIAGNOSIS — E785 Hyperlipidemia, unspecified: Secondary | ICD-10-CM | POA: Diagnosis present

## 2019-11-09 DIAGNOSIS — I10 Essential (primary) hypertension: Secondary | ICD-10-CM | POA: Diagnosis present

## 2019-11-09 DIAGNOSIS — I509 Heart failure, unspecified: Secondary | ICD-10-CM

## 2019-11-09 DIAGNOSIS — Z66 Do not resuscitate: Secondary | ICD-10-CM | POA: Diagnosis present

## 2019-11-09 DIAGNOSIS — N390 Urinary tract infection, site not specified: Secondary | ICD-10-CM | POA: Diagnosis present

## 2019-11-09 DIAGNOSIS — Z79899 Other long term (current) drug therapy: Secondary | ICD-10-CM

## 2019-11-09 DIAGNOSIS — Z515 Encounter for palliative care: Secondary | ICD-10-CM

## 2019-11-09 DIAGNOSIS — Z823 Family history of stroke: Secondary | ICD-10-CM

## 2019-11-09 DIAGNOSIS — R29898 Other symptoms and signs involving the musculoskeletal system: Secondary | ICD-10-CM

## 2019-11-09 DIAGNOSIS — Z7982 Long term (current) use of aspirin: Secondary | ICD-10-CM

## 2019-11-09 DIAGNOSIS — L89891 Pressure ulcer of other site, stage 1: Secondary | ICD-10-CM | POA: Diagnosis present

## 2019-11-09 DIAGNOSIS — G20A1 Parkinson's disease without dyskinesia, without mention of fluctuations: Secondary | ICD-10-CM | POA: Diagnosis present

## 2019-11-09 LAB — BASIC METABOLIC PANEL
Anion gap: 8 (ref 5–15)
BUN: 24 mg/dL — ABNORMAL HIGH (ref 8–23)
CO2: 28 mmol/L (ref 22–32)
Calcium: 9.5 mg/dL (ref 8.9–10.3)
Chloride: 104 mmol/L (ref 98–111)
Creatinine, Ser: 0.79 mg/dL (ref 0.44–1.00)
GFR calc Af Amer: >60 mL/min (ref >60)
GFR calc non Af Amer: >60 mL/min (ref >60)
Glucose, Bld: 130 mg/dL — ABNORMAL HIGH (ref 70–99)
Potassium: 4.1 mmol/L (ref 3.5–5.1)
Sodium: 140 mmol/L (ref 135–145)

## 2019-11-09 LAB — URINALYSIS, COMPLETE (UACMP) WITH MICROSCOPIC
Bacteria, UA: NONE SEEN
Bilirubin Urine: NEGATIVE
Glucose, UA: NEGATIVE mg/dL
Hgb urine dipstick: NEGATIVE
Ketones, ur: NEGATIVE mg/dL
Nitrite: NEGATIVE
Protein, ur: NEGATIVE mg/dL
Specific Gravity, Urine: 1.009 (ref 1.005–1.030)
WBC, UA: >50 WBC/hpf — ABNORMAL HIGH (ref 0–5)
pH: 7 (ref 5.0–8.0)

## 2019-11-09 LAB — CBC
HCT: 42 % (ref 36.0–46.0)
Hemoglobin: 13.6 g/dL (ref 12.0–15.0)
MCH: 27 pg (ref 26.0–34.0)
MCHC: 32.4 g/dL (ref 30.0–36.0)
MCV: 83.3 fL (ref 80.0–100.0)
Platelets: 241 10*3/uL (ref 150–400)
RBC: 5.04 MIL/uL (ref 3.87–5.11)
RDW: 15.7 % — ABNORMAL HIGH (ref 11.5–15.5)
WBC: 6.5 10*3/uL (ref 4.0–10.5)
nRBC: 0 % (ref 0.0–0.2)

## 2019-11-09 LAB — GLUCOSE, CAPILLARY: Glucose-Capillary: 107 mg/dL — ABNORMAL HIGH (ref 70–99)

## 2019-11-09 LAB — BRAIN NATRIURETIC PEPTIDE: B Natriuretic Peptide: 161 pg/mL — ABNORMAL HIGH (ref 0.0–100.0)

## 2019-11-09 MED ORDER — CARBIDOPA-LEVODOPA 25-100 MG PO TABS
1.0000 | ORAL_TABLET | Freq: Two times a day (BID) | ORAL | Status: DC
  Filled 2019-11-09: qty 1

## 2019-11-09 MED ORDER — FOSFOMYCIN TROMETHAMINE 3 G PO PACK
3.0000 g | PACK | ORAL | Status: AC
  Administered 2019-11-09: 20:00:00 3 g via ORAL
  Filled 2019-11-09: qty 3

## 2019-11-09 MED ORDER — CARBIDOPA-LEVODOPA ER 25-100 MG PO TBCR
1.0000 | EXTENDED_RELEASE_TABLET | Freq: Every day | ORAL | Status: DC
  Administered 2019-11-09: 20:00:00 1 via ORAL
  Filled 2019-11-09: qty 1

## 2019-11-09 MED ORDER — TRAMADOL HCL 50 MG PO TABS
50.0000 mg | ORAL_TABLET | ORAL | Status: AC
  Administered 2019-11-09: 20:00:00 50 mg via ORAL
  Filled 2019-11-09: qty 1

## 2019-11-09 MED ORDER — METOPROLOL SUCCINATE ER 50 MG PO TB24
25.0000 mg | ORAL_TABLET | ORAL | Status: AC
  Administered 2019-11-09: 20:00:00 25 mg via ORAL
  Filled 2019-11-09: qty 1

## 2019-11-09 MED ORDER — SODIUM CHLORIDE 0.9% FLUSH: 3.0000 mL | Freq: Once | INTRAVENOUS | Status: DC

## 2019-11-09 MED ORDER — METOPROLOL SUCCINATE ER 25 MG PO TB24
12.5000 mg | ORAL_TABLET | ORAL | Status: AC
  Administered 2019-11-09: 18:00:00 12.5 mg via ORAL
  Filled 2019-11-09: qty 0.5

## 2019-11-09 MED ORDER — ACETAMINOPHEN 325 MG PO TABS
650.0000 mg | ORAL_TABLET | Freq: Once | ORAL | Status: AC
  Administered 2019-11-09: 20:00:00 650 mg via ORAL
  Filled 2019-11-09: qty 2

## 2019-11-09 NOTE — ED Notes (Signed)
Management consultant, placed Pure wick on pt d/t urinary incontinence.

## 2019-11-09 NOTE — ED Notes (Deleted)
Pt transported to CT ?

## 2019-11-09 NOTE — ED Notes (Signed)
EDP @ bedside 

## 2019-11-09 NOTE — ED Notes (Deleted)
Cardiology @ bedside.

## 2019-11-09 NOTE — ED Triage Notes (Signed)
Pt presents to ED from home via [facility]. Pt c/o weakness, tiredness, and feeling cold.  EMS reported pt is non-ambulatory, wheelchair/bedbound. Pt's BP was elevated to 200/100 despite taking all medication this AM.

## 2019-11-09 NOTE — TOC Initial Note (Signed)
Transition of Care [facility]) - Initial/Assessment Note    Patient Details  Name: Jennifer Snyder MRN: [id number] Date of Birth: November 14, 1935  [facility] CM/SW Contact:    Ewing Schlein, LCSW Phone Number: 11/09/2019, 6:11 PM  Clinical Narrative: TOC received consult for HH/SNF. CSW spoke with patient's daughter, Jacquelynne Guedes, to discuss whether family wanted patient to be discharged home with [facility] or transferred to a SNF. Daughter reported patient would not want to go to a SNF, but the family believes she needs rehab as they have safety concerns with transferring patient as she will collapse at times due to increased weakness.  Daughter reported patient has a lift chair and wheelchair at home and the family tried to get a Hoyer lift at one time, but the patient was not receptive to using the lift. CSW asked about services patient receives at home. Daughter shared patient has an evening caregiver/sitter through [employer] at night and has a few friends that stay with her during the day, so patient has 24-hour supervision and care at home. CSW provided daughter with SNF options. Family is requesting the referral be made to [facility] [address] ([place]), and [facility]). CSW started FL2. PASRR is pending.       Expected Discharge Plan: Skilled Nursing Facility Barriers to Discharge: Continued Medical Work up, ED Awaiting Stat Approval (PASRR)  Patient Goals and CMS Choice Patient states their goals for this hospitalization and ongoing recovery are:: Return home after rehab in SNF CMS Medicare.gov Compare Post Acute Care list provided to:: Patient Represenative (must comment)(Sharon Hausmann (daughter) PH: [phone]) Choice offered to / list presented to : Adult Children  Expected Discharge Plan and Services Expected Discharge Plan: Skilled Nursing Facility   Post Acute Care Choice: Skilled Nursing Facility Living arrangements for the past 2 months: Single Family  Home  Prior Living Arrangements/Services Living arrangements for the past 2 months: Single Family Home Lives with:: Self Patient language and need for interpreter reviewed:: Yes Do you feel safe going back to the place where you live?: Yes      Need for Family Participation in Patient Care: Yes (Comment)(Patient has Parkinson's so her voice is soft) Care giver support system in place?: Yes (comment)(Sharon Hiley (daughter)) Current home services: Sitter(Provided by [employer] at night and by friends during the day) Criminal Activity/Legal Involvement Pertinent to Current Situation/Hospitalization: No - Comment as needed  Activities of Daily Living    Permission Sought/Granted   Emotional Assessment Orientation: : Oriented to Self, Oriented to Place, Oriented to  Time, Oriented to Situation Alcohol / Substance Use: Not Applicable Psych Involvement: No (comment)  Admission diagnosis:  weakness Patient Active Problem List   Diagnosis Date Noted  . Primary generalized hypertrophic osteoarthrosis 07/01/2019  . Encounter for general adult medical examination with abnormal findings 04/04/2018  . Urinary tract infection without hematuria 04/04/2018  . Chronic otitis externa of both ears 04/04/2018  . Dysuria 04/04/2018  . Muscle spasticity 12/28/2017  . B12 deficiency 12/28/2017  . Parkinson disease (HCC) 12/28/2017  . Essential hypertension 12/28/2017   PCP:  Lyndon Code, MD [phone] [place] [facility] [place] - [address] [place] [address] Phone: [phone] Fax: [phone]  Readmission Risk Interventions No flowsheet data found.

## 2019-11-09 NOTE — ED Notes (Signed)
This RN to bedside at this time. EDP aware of patient's continued HTN at this time. This RN explained to patient and daughter awaiting Sinemet arrival from pharmacy then would administer. Pt's daughter made this RN aware of plan to hold patient in ED overnight and have patient transferred to rehab when available.   No new orders received regarding patient's HTN at this time.

## 2019-11-09 NOTE — ED Notes (Signed)
Pt blood sugar 107. UA collected and sent to lab.

## 2019-11-09 NOTE — ED Notes (Addendum)
Pt's daughter bedside, daughter reports pt has been having increasing difficulty swallowing. BP remains 200s/100s, EDP aware.  NAD noted.

## 2019-11-09 NOTE — ED Notes (Deleted)
EDP @ bedside 

## 2019-11-09 NOTE — ED Triage Notes (Addendum)
Pt states "feels like I am going down hill with the Parkinsons". Pt reports being able to see letters but not comprehending what they are. Pt reports feeling more weak and tired and is always cold.  Pt stated her SBP was 200s last week. Pt has round the clock caregivers. Per pt caregivers and pt have increasing difficulty transferring to toilet/bed.

## 2019-11-09 NOTE — ED Notes (Signed)
Pt c/o pain, states usually takes Sinemet for pain. This RN reviewed patient's medications with patient's daughter, pt able to verify that she takes Sinemet IR at 0600 and 1200 daily, and Sinemet ER between 1800-2000 daily. This RN reviewed with EDP, adjusted medications with [facility] as indicated by patient. Pharmacy made aware to send Sinemet ER.

## 2019-11-09 NOTE — ED Provider Notes (Signed)
[facility] Emergency Department Provider Note   ____________________________________________   First MD Initiated Contact with Patient 11/09/19 1617     (approximate)  I have reviewed the triage vital signs and the nursing notes.   HISTORY  Chief Complaint Weakness    HPI Jennifer Snyder is a 84 y.o. female history of Parkinson's hypertension  Patient here today as she is with her family including her daughter.  She has home care, but over the last several weeks she is just slowly been progressing with increasing home care needs to the point it is difficult to transfer her now from bed to toilet and provide care for her in her own home despite having 24/7 care at home  She has care attendance in her home, but she has been slowly worsening.  Daughter reports that patient has been hesitant to speak about going to nursing home though they did anticipate this may, at some point  She has not had any a sudden or acute events.  This seems to be a worsening trend over time.  Patient is very weak all the time, barely able to lift her own arms hands and feet for months now  She uses a wheelchair and is fully dependent on others for her daily living.  She does eat regular diet, does get coughing sometimes after eating for about 5 months now and there is been concern noted that she could have risk for aspiration, but the patient knows this understands this and does not wish to have anything other than a regular diet  Patient and daughter both affirm she is DO NOT RESUSCITATE as well  They are here for concerns of her worsening weakness and potential need to look at nursing home placement or increasing home care  She has not noticed any problems with abdominal pain no chest pain no shortness of breath.  No fevers or chills.  No trouble with urination.  No diarrhea.   Past Medical History:  Diagnosis Date  . Hyperlipidemia   . Hypertension   . Osteoarthritis   .  Parkinson's disease (HCC)   . Peptic ulcer disease     Patient Active Problem List   Diagnosis Date Noted  . Primary generalized hypertrophic osteoarthrosis 07/01/2019  . Encounter for general adult medical examination with abnormal findings 04/04/2018  . Urinary tract infection without hematuria 04/04/2018  . Chronic otitis externa of both ears 04/04/2018  . Dysuria 04/04/2018  . Muscle spasticity 12/28/2017  . B12 deficiency 12/28/2017  . Parkinson disease (HCC) 12/28/2017  . Essential hypertension 12/28/2017    Past Surgical History:  Procedure Laterality Date  . ABDOMINAL HERNIA REPAIR    . ADENOIDECTOMY  1994  . CATARACT EXTRACTION, BILATERAL  09/2014, 07/2014  . child birth  [id number], [id number]  . CHOLECYSTECTOMY  1989  . laser surgery on both eyes  11/2016  . left knee replacement  1993  . para esophogeal hernia repair    . TONSILLECTOMY  1944  . VAGINAL HYSTERECTOMY  1976    Prior to Admission medications   Medication Sig Start Date End Date Taking? Authorizing Provider  aspirin 81 MG chewable tablet Chew by mouth.   Yes [provider]  carbidopa-levodopa (SINEMET) 25-100 MG tablet Take 1 tablet by mouth 2 (two) times daily.  07/07/17  Yes [provider]  Carbidopa-Levodopa ER (SINEMET CR) 25-100 MG tablet controlled release Take 1 tablet by mouth at bedtime.  06/08/17  Yes [provider]  Cholecalciferol (  VITAMIN D3 PO) Take by mouth daily.   Yes [provider]  clorazepate (TRANXENE) 3.75 MG tablet TAKE 1 TABLET BY MOUTH 1 TO 3 TIMES A DAY 07/22/19  Yes Boscia, Heather E, NP  furosemide (LASIX) 20 MG tablet Take 1 tablet (20 mg total) by mouth every other day. 04/13/19  Yes Boscia, Kathlynn Grate, NP  methocarbamol (ROBAXIN) 750 MG tablet Take 1 tablet (750 mg total) by mouth every 6 (six) hours as needed for muscle spasms. 04/15/19  Yes Boscia, Kathlynn Grate, NP  metoprolol succinate (TOPROL-XL) 25 MG 24 hr tablet START WITH HALF TAB A DAY AND MAY  INCREASE TO ONE TAB DAILY 10/17/19  Yes Boscia, Heather E, NP  oxybutynin (DITROPAN) 5 MG tablet TAKE 1 TABLET BY MOUTH EVERY DAY 08/11/19  Yes Carlean Jews, NP  pantoprazole (PROTONIX) 40 MG tablet TAKE 1 TABLET BY MOUTH DAILY FOR GERD 07/21/19  Yes Lyndon Code, MD  polyethylene glycol (MIRALAX / GLYCOLAX) packet Take 17 g by mouth daily as needed for mild constipation.    Yes [provider]  rOPINIRole (REQUIP) 0.25 MG tablet ONE TO 3 TABS A DAY AS DIRECTED Patient taking differently: Take 0.25 mg by mouth 2 (two) times daily.  12/25/17  Yes Lyndon Code, MD  traMADol (ULTRAM) 50 MG tablet Take 1 tablet (50 mg total) by mouth every 6 (six) hours as needed. for pain 06/14/19  Yes Carlean Jews, NP    Allergies Codeine  Family History  Problem Relation Age of Onset  . Lung cancer Mother   . Hyperlipidemia Mother   . Hypertension Mother   . Osteoarthritis Mother   . Stroke Mother     Social History Social History   Tobacco Use  . Smoking status: Never Smoker  . Smokeless tobacco: Never Used  Substance Use Topics  . Alcohol use: No  . Drug use: No    Review of Systems Constitutional: No fever/chills Eyes: No visual changes. ENT: No sore throat. Cardiovascular: Denies chest pain. Respiratory: Denies shortness of breath. Gastrointestinal: No abdominal pain.   Genitourinary: Negative for dysuria. Musculoskeletal: Negative for back pain. Skin: Negative for rash. Neurological: Negative for headaches, areas of focal weakness or numbness.  She is always extremely weak due to her Parkinson's.    ____________________________________________   PHYSICAL EXAM:  VITAL SIGNS: ED Triage Vitals  Enc Vitals Group     BP 11/09/19 1555 (!) 207/94     Pulse Rate 11/09/19 1555 72     Resp 11/09/19 1555 13     Temp 11/09/19 1555 (!) 97.2 F (36.2 C)     Temp src --      SpO2 11/09/19 1545 95 %     Weight 11/09/19 1557 150 lb (68 kg)     Height 11/09/19 1557 5'  3" (1.6 m)     Head Circumference --      Peak Flow --      Pain Score 11/09/19 1556 0     Pain Loc --      Pain Edu? --      Excl. in GC? --     Constitutional: Alert and oriented. Well appearing and in no acute distress.  She appears quite chronically ill, kyphotic.  Soft and speech.  Parkinsonian features. Eyes: Conjunctivae are normal. Head: Atraumatic. Nose: No congestion/rhinnorhea. Mouth/Throat: Mucous membranes are moist. Neck: No stridor.  Cardiovascular: Normal rate, regular rhythm. Grossly normal heart sounds.  Good peripheral circulation. Respiratory: Normal respiratory  effort.  No retractions. Lungs CTAB. Gastrointestinal: Soft and nontender. No distention. Musculoskeletal: No lower extremity tenderness nor edema. Neurologic:  Normal speech and language though very soft. No gross focal neurologic deficits are appreciated.  She is extremely weak with about 3-5 strength in her upper extremities bilateral, perhaps 1-2 out of 5 in her lower extremities bilateral in both the patient and her daughter affirm this is her baseline due to severe Parkinson's. Skin:  Skin is warm, dry and intact. No rash noted. Psychiatric: Mood and affect are normal. Speech and behavior are normal.  She is very pleasant.  ____________________________________________   LABS (all labs ordered are listed, but only abnormal results are displayed)  Labs Reviewed  BASIC METABOLIC PANEL - Abnormal; Notable for the following components:      Result Value   Glucose, Bld 130 (*)    BUN 24 (*)    All other components within normal limits  CBC - Abnormal; Notable for the following components:   RDW 15.7 (*)    All other components within normal limits  URINALYSIS, COMPLETE (UACMP) WITH MICROSCOPIC - Abnormal; Notable for the following components:   Color, Urine YELLOW (*)    APPearance HAZY (*)    Leukocytes,Ua LARGE (*)    WBC, UA >50 (*)    All other components within normal limits  GLUCOSE,  CAPILLARY - Abnormal; Notable for the following components:   Glucose-Capillary 107 (*)    All other components within normal limits  BRAIN NATRIURETIC PEPTIDE - Abnormal; Notable for the following components:   B Natriuretic Peptide 161.0 (*)    All other components within normal limits  SARS CORONAVIRUS 2 (TAT 6-24 HRS)  URINE CULTURE  CBG MONITORING, ED   ____________________________________________  EKG  Reviewed inter by me at 1600 Heart rate 70 QRS 90 QTc 450 Some artifact likely due to patient's Parkinson's.  No evidence of acute ischemia denoted, mild T wave inversion noted in aVF also seen somewhat in V6.  No ST elevation  Of note the patient does not have any chest pain ____________________________________________  RADIOLOGY  DG Chest 2 View  Result Date: 11/09/2019 CLINICAL DATA:  Weakness EXAM: CHEST - 2 VIEW COMPARISON:  04/27/2012 FINDINGS: Cardiomegaly left lower lobe airspace opacity with probable left effusion. Right base atelectasis or infiltrate. Small right effusion suspected. No acute bony abnormality. IMPRESSION: Cardiomegaly. Moderate to large left effusion and small right effusion. Bibasilar airspace opacities, left greater than right could reflect atelectasis or infiltrates. Electronically Signed   By: Charlett Nose M.D.   On: 11/09/2019 17:57   CT Head Wo Contrast  Result Date: 11/09/2019 CLINICAL DATA:  84 year old female with weakness. EXAM: CT HEAD WITHOUT CONTRAST TECHNIQUE: Contiguous axial images were obtained from the base of the skull through the vertex without intravenous contrast. COMPARISON:  Head CT dated 04/27/2012. FINDINGS: Brain: Moderate age-related atrophy and chronic microvascular ischemic changes. Old right occipital infarct. There is no acute intracranial hemorrhage. No mass effect or midline shift. No extra-axial fluid collection. Vascular: No hyperdense vessel or unexpected calcification. Skull: Normal. Negative for fracture or focal  lesion. Sinuses/Orbits: No acute finding. Other: None IMPRESSION: 1. No acute intracranial pathology. 2. Moderate age-related atrophy and chronic microvascular ischemic changes. Old right occipital infarct. Electronically Signed   By: Elgie Collard M.D.   On: 11/09/2019 19:32   CT Chest Wo Contrast  Result Date: 11/09/2019 CLINICAL DATA:  Hypertension. Weakness. Effusion versus pneumonia on radiograph EXAM: CT CHEST WITHOUT CONTRAST TECHNIQUE: Multidetector CT  imaging of the chest was performed following the standard protocol without IV contrast. COMPARISON:  Radiograph earlier this day FINDINGS: Cardiovascular: Aortic atherosclerosis and tortuosity. Multi chamber cardiomegaly mitral annulus and coronary artery calcifications. Minimal pericardial fluid without significant effusion. Mediastinum/Nodes: Large hiatal hernia with greater than 50% of the stomach intrathoracic. Mediastinal lipomatosis adjacent to the left heart border. No esophageal wall thickening. No enlarged mediastinal lymph nodes. No visualized thyroid nodule. Lungs/Pleura: Compressive atelectasis in the left lower lobe adjacent to hiatal hernia. Dependent atelectasis in both lower lobes. Chronic volume loss in the medial right middle lobe. No evidence of pneumonia. No significant pleural effusion. No evidence of pulmonary edema. Upper Abdomen: No acute findings. Musculoskeletal: Exaggerated thoracic kyphosis. Mild degenerative change in the thoracic spine. There are no acute or suspicious osseous abnormalities. Chronic degenerative change of both shoulders, right greater than left. IMPRESSION: 1. No significant pleural effusion.  No evidence of pneumonia. 2. Left lung base opacity on radiograph is secondary to large hiatal hernia and prominent epicardial fat pad. 3. Multi chamber cardiomegaly. Coronary artery calcifications. 4. Multifocal atelectasis. Aortic Atherosclerosis (ICD10-I70.0). Electronically Signed   By: Narda [place] M.D.   On:  11/09/2019 19:41     Imaging reviewed, in particular the chest there is no pleural effusion or pneumonia.  A known large hiatal hernia is noted.  Family is already aware this  CT head negative for acute, possible old occipital infarct ____________________________________________    PROCEDURES  Procedure(s) performed: None  Procedures  Critical Care performed: No  ____________________________________________   INITIAL IMPRESSION / ASSESSMENT AND PLAN / ED COURSE  Pertinent labs & imaging results that were available during my care of the patient were reviewed by me and considered in my medical decision making (see chart for details).     Clinical Course as of Nov 10 3  Wed Nov 09, 2019  1808 Case Management Shawnie Dapper) working on SNF placement as this is desired by patient and family at present time.    [MQ]  1832 Urine sent for culture, was given single dose fosfomycin in the event possible UTI as we await culture data.   [MQ]  1903 Blood pressure significantly elevated still.  Obtaining CT scan of the chest to further evaluate possible bilateral pleural effusions versus infiltrate, left pleural effusions I would suspect potentially CHF especially in the setting of her hypertension and increasing fatigue.  Additionally, patient reports has been having a little bit of a blurring feeling in her vision for about a week, and in consideration of her significant hypertension I wonder if this could also be figuring in.  Await imaging   [MQ]  1955 Patient blood pressure remains quite elevated after low-dose metoprolol.  Discussed with patient and her daughter, she has a history of high blood pressures when she is under stress or anxious they report.  Is currently not having a headache, no blurred vision, no chest pain no trouble breathing.  Having some right shoulder pain which is chronic and tramadol is helped that.  I will trial a higher dose of metoprolol at this point, continue  monitor blood pressures plan is currently to evaluate for SNF placement   [MQ]    Clinical Course User Index [MQ] Sharyn Creamer, MD   ----------------------------------------- 12:06 AM on 11/10/2019 -----------------------------------------  Patient has been resting comfortably throughout the later half of my shift.  Ongoing care assigned to Dr. Manson Passey, continuing to follow, working with case management regarding appropriate care disposition likely skilled nursing  ____________________________________________   FINAL CLINICAL IMPRESSION(S) / ED DIAGNOSES  Final diagnoses:  Muscular deconditioning  Parkinson disease ([place])  Hypertension, unspecified type        Note:  This document was prepared using Dragon voice recognition software and may include unintentional dictation errors       Delman Kitten, MD 11/10/19 0006

## 2019-11-10 ENCOUNTER — Inpatient Hospital Stay (HOSPITAL_COMMUNITY)
Admit: 2019-11-10 | Discharge: 2019-11-10 | Disposition: A | Discharge status: Home / Self Care | Payer: Medicare Other | Attending: Internal Medicine | Admitting: Internal Medicine

## 2019-11-10 ENCOUNTER — Emergency Department: Payer: Medicare Other

## 2019-11-10 DIAGNOSIS — Z20822 Contact with and (suspected) exposure to covid-19: Secondary | ICD-10-CM | POA: Diagnosis present

## 2019-11-10 DIAGNOSIS — N390 Urinary tract infection, site not specified: Secondary | ICD-10-CM | POA: Diagnosis present

## 2019-11-10 DIAGNOSIS — M1990 Unspecified osteoarthritis, unspecified site: Secondary | ICD-10-CM | POA: Diagnosis present

## 2019-11-10 DIAGNOSIS — E785 Hyperlipidemia, unspecified: Secondary | ICD-10-CM | POA: Diagnosis present

## 2019-11-10 DIAGNOSIS — J9601 Acute respiratory failure with hypoxia: Secondary | ICD-10-CM

## 2019-11-10 DIAGNOSIS — I509 Heart failure, unspecified: Secondary | ICD-10-CM

## 2019-11-10 DIAGNOSIS — K219 Gastro-esophageal reflux disease without esophagitis: Secondary | ICD-10-CM | POA: Diagnosis present

## 2019-11-10 DIAGNOSIS — Z66 Do not resuscitate: Secondary | ICD-10-CM | POA: Diagnosis present

## 2019-11-10 DIAGNOSIS — G20 Parkinson's disease: Secondary | ICD-10-CM | POA: Diagnosis present

## 2019-11-10 DIAGNOSIS — R531 Weakness: Secondary | ICD-10-CM

## 2019-11-10 DIAGNOSIS — I110 Hypertensive heart disease with heart failure: Secondary | ICD-10-CM | POA: Diagnosis present

## 2019-11-10 DIAGNOSIS — I5031 Acute diastolic (congestive) heart failure: Secondary | ICD-10-CM | POA: Diagnosis present

## 2019-11-10 DIAGNOSIS — Z823 Family history of stroke: Secondary | ICD-10-CM | POA: Diagnosis not present

## 2019-11-10 DIAGNOSIS — Z801 Family history of malignant neoplasm of trachea, bronchus and lung: Secondary | ICD-10-CM | POA: Diagnosis not present

## 2019-11-10 DIAGNOSIS — N3000 Acute cystitis without hematuria: Secondary | ICD-10-CM

## 2019-11-10 DIAGNOSIS — Z7982 Long term (current) use of aspirin: Secondary | ICD-10-CM | POA: Diagnosis not present

## 2019-11-10 DIAGNOSIS — Z79899 Other long term (current) drug therapy: Secondary | ICD-10-CM | POA: Diagnosis not present

## 2019-11-10 DIAGNOSIS — I10 Essential (primary) hypertension: Secondary | ICD-10-CM

## 2019-11-10 DIAGNOSIS — E538 Deficiency of other specified B group vitamins: Secondary | ICD-10-CM | POA: Diagnosis present

## 2019-11-10 DIAGNOSIS — I160 Hypertensive urgency: Secondary | ICD-10-CM | POA: Diagnosis present

## 2019-11-10 DIAGNOSIS — L89891 Pressure ulcer of other site, stage 1: Secondary | ICD-10-CM | POA: Diagnosis present

## 2019-11-10 DIAGNOSIS — Z8249 Family history of ischemic heart disease and other diseases of the circulatory system: Secondary | ICD-10-CM | POA: Diagnosis not present

## 2019-11-10 DIAGNOSIS — Z83438 Family history of other disorder of lipoprotein metabolism and other lipidemia: Secondary | ICD-10-CM | POA: Diagnosis not present

## 2019-11-10 DIAGNOSIS — Z885 Allergy status to narcotic agent status: Secondary | ICD-10-CM | POA: Diagnosis not present

## 2019-11-10 LAB — BASIC METABOLIC PANEL
Anion gap: 9 (ref 5–15)
BUN: 24 mg/dL — ABNORMAL HIGH (ref 8–23)
CO2: 28 mmol/L (ref 22–32)
Calcium: 9.1 mg/dL (ref 8.9–10.3)
Chloride: 102 mmol/L (ref 98–111)
Creatinine, Ser: 0.63 mg/dL (ref 0.44–1.00)
GFR calc Af Amer: >60 mL/min (ref >60)
GFR calc non Af Amer: >60 mL/min (ref >60)
Glucose, Bld: 128 mg/dL — ABNORMAL HIGH (ref 70–99)
Potassium: 3.7 mmol/L (ref 3.5–5.1)
Sodium: 139 mmol/L (ref 135–145)

## 2019-11-10 LAB — SARS CORONAVIRUS 2 (TAT 6-24 HRS): SARS Coronavirus 2: NEGATIVE

## 2019-11-10 LAB — CBC WITH DIFFERENTIAL/PLATELET
Abs Immature Granulocytes: 0.03 10*3/uL (ref 0.00–0.07)
Basophils Absolute: 0 10*3/uL (ref 0.0–0.1)
Basophils Relative: 0 %
Eosinophils Absolute: 0.1 10*3/uL (ref 0.0–0.5)
Eosinophils Relative: 2 %
HCT: 39.7 % (ref 36.0–46.0)
Hemoglobin: 12.7 g/dL (ref 12.0–15.0)
Immature Granulocytes: 1 %
Lymphocytes Relative: 23 %
Lymphs Abs: 1.5 10*3/uL (ref 0.7–4.0)
MCH: 27 pg (ref 26.0–34.0)
MCHC: 32 g/dL (ref 30.0–36.0)
MCV: 84.3 fL (ref 80.0–100.0)
Monocytes Absolute: 0.4 10*3/uL (ref 0.1–1.0)
Monocytes Relative: 6 %
Neutro Abs: 4.5 10*3/uL (ref 1.7–7.7)
Neutrophils Relative %: 68 %
Platelets: 214 10*3/uL (ref 150–400)
RBC: 4.71 MIL/uL (ref 3.87–5.11)
RDW: 15.4 % (ref 11.5–15.5)
WBC: 6.6 10*3/uL (ref 4.0–10.5)
nRBC: 0 % (ref 0.0–0.2)

## 2019-11-10 LAB — BLOOD GAS, VENOUS
Acid-Base Excess: 10.5 mmol/L — ABNORMAL HIGH (ref 0.0–2.0)
Bicarbonate: 35.1 mmol/L — ABNORMAL HIGH (ref 20.0–28.0)
O2 Saturation: 95.3 %
Patient temperature: 37
pCO2, Ven: 45 mmHg (ref 44.0–60.0)
pH, Ven: 7.5 — ABNORMAL HIGH (ref 7.250–7.430)
pO2, Ven: 70 mmHg — ABNORMAL HIGH (ref 32.0–45.0)

## 2019-11-10 LAB — ECHOCARDIOGRAM COMPLETE
Height: 63 in
Weight: 2400 oz

## 2019-11-10 LAB — BRAIN NATRIURETIC PEPTIDE: B Natriuretic Peptide: 324 pg/mL — ABNORMAL HIGH (ref 0.0–100.0)

## 2019-11-10 MED ORDER — TRAMADOL HCL 50 MG PO TABS
50.0000 mg | ORAL_TABLET | Freq: Four times a day (QID) | ORAL | Status: DC | PRN
  Administered 2019-11-10: 50 mg via ORAL
  Filled 2019-11-10: qty 1

## 2019-11-10 MED ORDER — CARBIDOPA-LEVODOPA ER 25-100 MG PO TBCR
1.0000 | EXTENDED_RELEASE_TABLET | Freq: Every day | ORAL | Status: DC
  Administered 2019-11-10 – 2019-11-11 (×2): 1 via ORAL
  Filled 2019-11-10 (×4): qty 1

## 2019-11-10 MED ORDER — FUROSEMIDE 10 MG/ML IJ SOLN
20.0000 mg | Freq: Every day | INTRAMUSCULAR | Status: DC
  Administered 2019-11-11 – 2019-11-12 (×2): 20 mg via INTRAVENOUS
  Filled 2019-11-10 (×2): qty 2

## 2019-11-10 MED ORDER — ENOXAPARIN SODIUM 40 MG/0.4ML ~~LOC~~ SOLN
40.0000 mg | SUBCUTANEOUS | Status: DC
  Administered 2019-11-10 – 2019-11-11 (×2): 40 mg via SUBCUTANEOUS
  Filled 2019-11-10 (×2): qty 0.4

## 2019-11-10 MED ORDER — ALBUTEROL SULFATE (2.5 MG/3ML) 0.083% IN NEBU: 2.5000 mg | INHALATION_SOLUTION | RESPIRATORY_TRACT | Status: DC | PRN

## 2019-11-10 MED ORDER — ACETAMINOPHEN 325 MG PO TABS
650.0000 mg | ORAL_TABLET | Freq: Four times a day (QID) | ORAL | Status: DC | PRN
  Administered 2019-11-12: 10:00:00 650 mg via ORAL
  Filled 2019-11-10: qty 2

## 2019-11-10 MED ORDER — FUROSEMIDE 40 MG PO TABS: 20.0000 mg | ORAL_TABLET | ORAL | Status: DC

## 2019-11-10 MED ORDER — SODIUM CHLORIDE 0.9 % IV SOLN
INTRAVENOUS | Status: DC | PRN
  Administered 2019-11-11: 02:00:00 250 mL via INTRAVENOUS

## 2019-11-10 MED ORDER — CLORAZEPATE DIPOTASSIUM 7.5 MG PO TABS
3.7500 mg | ORAL_TABLET | Freq: Two times a day (BID) | ORAL | Status: DC
  Administered 2019-11-10: 3.75 mg via ORAL
  Filled 2019-11-10: qty 1

## 2019-11-10 MED ORDER — CARBIDOPA-LEVODOPA 25-100 MG PO TABS
1.0000 | ORAL_TABLET | Freq: Two times a day (BID) | ORAL | Status: DC
  Administered 2019-11-10 – 2019-11-12 (×3): 1 via ORAL
  Filled 2019-11-10 (×5): qty 1

## 2019-11-10 MED ORDER — ROPINIROLE HCL 0.25 MG PO TABS
0.2500 mg | ORAL_TABLET | Freq: Two times a day (BID) | ORAL | Status: DC
  Administered 2019-11-10 – 2019-11-12 (×6): 0.25 mg via ORAL
  Filled 2019-11-10 (×8): qty 1

## 2019-11-10 MED ORDER — CARBIDOPA-LEVODOPA ER 25-100 MG PO TBCR
1.0000 | EXTENDED_RELEASE_TABLET | Freq: Every day | ORAL | Status: DC
  Filled 2019-11-10: qty 1

## 2019-11-10 MED ORDER — PANTOPRAZOLE SODIUM 20 MG PO TBEC
20.0000 mg | DELAYED_RELEASE_TABLET | Freq: Every day | ORAL | Status: DC
  Administered 2019-11-10 – 2019-11-11 (×2): 20 mg via ORAL
  Filled 2019-11-10 (×2): qty 1

## 2019-11-10 MED ORDER — DM-GUAIFENESIN ER 30-600 MG PO TB12
1.0000 | ORAL_TABLET | Freq: Two times a day (BID) | ORAL | Status: DC
  Administered 2019-11-10 – 2019-11-12 (×5): 1 via ORAL
  Filled 2019-11-10 (×5): qty 1

## 2019-11-10 MED ORDER — ASPIRIN 81 MG PO CHEW
81.0000 mg | CHEWABLE_TABLET | Freq: Every day | ORAL | Status: DC
  Administered 2019-11-10 – 2019-11-12 (×3): 81 mg via ORAL
  Filled 2019-11-10 (×4): qty 1

## 2019-11-10 MED ORDER — SODIUM CHLORIDE 0.9 % IV SOLN
3.0000 g | Freq: Four times a day (QID) | INTRAVENOUS | Status: DC
  Administered 2019-11-10 – 2019-11-11 (×5): 3 g via INTRAVENOUS
  Filled 2019-11-10 (×3): qty 8
  Filled 2019-11-10 (×2): qty 3
  Filled 2019-11-10 (×2): qty 8
  Filled 2019-11-10: qty 3

## 2019-11-10 MED ORDER — IPRATROPIUM BROMIDE 0.02 % IN SOLN
0.5000 mg | Freq: Four times a day (QID) | RESPIRATORY_TRACT | Status: DC
  Administered 2019-11-10: 15:00:00 0.5 mg via RESPIRATORY_TRACT
  Filled 2019-11-10: qty 2.5

## 2019-11-10 MED ORDER — POLYETHYLENE GLYCOL 3350 17 G PO PACK: 17.0000 g | PACK | Freq: Every day | ORAL | Status: DC | PRN

## 2019-11-10 MED ORDER — OXYBUTYNIN CHLORIDE 5 MG PO TABS
5.0000 mg | ORAL_TABLET | Freq: Every day | ORAL | Status: DC
  Administered 2019-11-10 – 2019-11-12 (×3): 5 mg via ORAL
  Filled 2019-11-10 (×3): qty 1

## 2019-11-10 MED ORDER — VITAMIN D 25 MCG (1000 UNIT) PO TABS
1000.0000 [IU] | ORAL_TABLET | Freq: Every day | ORAL | Status: DC
  Administered 2019-11-10 – 2019-11-12 (×3): 1000 [IU] via ORAL
  Filled 2019-11-10 (×3): qty 1

## 2019-11-10 MED ORDER — FUROSEMIDE 10 MG/ML IJ SOLN
20.0000 mg | Freq: Once | INTRAMUSCULAR | Status: AC
  Administered 2019-11-10: 10:00:00 20 mg via INTRAVENOUS
  Filled 2019-11-10: qty 4

## 2019-11-10 MED ORDER — ONDANSETRON HCL 4 MG/2ML IJ SOLN: 4.0000 mg | Freq: Three times a day (TID) | INTRAMUSCULAR | Status: DC | PRN

## 2019-11-10 MED ORDER — AMLODIPINE BESYLATE 5 MG PO TABS
5.0000 mg | ORAL_TABLET | Freq: Every day | ORAL | Status: DC
  Administered 2019-11-10 – 2019-11-11 (×2): 5 mg via ORAL
  Filled 2019-11-10 (×2): qty 1

## 2019-11-10 MED ORDER — HYDRALAZINE HCL 20 MG/ML IJ SOLN
5.0000 mg | INTRAMUSCULAR | Status: DC | PRN
  Administered 2019-11-10 – 2019-11-11 (×2): 5 mg via INTRAVENOUS
  Filled 2019-11-10 (×2): qty 1

## 2019-11-10 NOTE — ED Notes (Signed)
Echo at bedside at this time, patient's daughter at bedside at this time. No change in patient condition.

## 2019-11-10 NOTE — NC FL2 (Signed)
[place] LEVEL OF CARE SCREENING TOOL     IDENTIFICATION  Patient Name: Jennifer Snyder Birthdate: 05-04-1936 Sex: female Admission Date (Current Location): 11/09/2019  [place] and [place] Number:  Engineering geologist and Address:  [facility], [address] [place], [place] [address]      Provider Number: [id number]  Attending Physician Name and Address:  No att. providers found  Relative Name and Phone Number:  Shanterria Franta (daughter) [facility]: [phone]    Current Level of Care: Hospital Recommended Level of Care: [place] Prior Approval Number:    Date Approved/Denied:   PASRR Number:  [id number]  Discharge Plan: SNF    Current Diagnoses: Patient Active Problem List   Diagnosis Date Noted  . UTI (urinary tract infection) 11/10/2019  . GERD (gastroesophageal reflux disease) 11/10/2019  . Generalized weakness 11/10/2019  . Acute respiratory failure with hypoxia ([place]) 11/10/2019  . Hypertensive urgency 11/10/2019  . Primary generalized hypertrophic osteoarthrosis 07/01/2019  . Encounter for general adult medical examination with abnormal findings 04/04/2018  . Urinary tract infection without hematuria 04/04/2018  . Chronic otitis externa of both ears 04/04/2018  . Dysuria 04/04/2018  . Muscle spasticity 12/28/2017  . B12 deficiency 12/28/2017  . Parkinson disease ([place]) 12/28/2017  . Essential hypertension 12/28/2017    Orientation RESPIRATION BLADDER Height & Weight     Self, Time, Situation, Place  Normal Incontinent, External catheter Weight: 150 lb (68 kg) Height:  5\' 3"  (160 cm)  BEHAVIORAL SYMPTOMS/MOOD NEUROLOGICAL BOWEL NUTRITION STATUS    (Patient has Parkinson's) Continent    AMBULATORY STATUS COMMUNICATION OF NEEDS Skin   Extensive Assist Verbally Normal                       Personal Care Assistance Level of Assistance  Bathing, Dressing Bathing Assistance: Maximum assistance    Dressing Assistance: Maximum assistance     Functional Limitations Info  Sight, Speech, Hearing Sight Info: Impaired(Has glasses) Hearing Info: Impaired(Has hearing aids for both ears but does not like to wear) Speech Info: Impaired(Due to Parkinson's patient's voice is soft and mouth does not move much)    SPECIAL CARE FACTORS FREQUENCY                       Contractures      Additional Factors Info  Allergies, Code Status Code Status Info: DNR Allergies Info: Codeine           Current Medications (11/10/2019):  This is the current hospital active medication list Current Facility-Administered Medications  Medication Dose Route Frequency Provider Last Rate Last Admin  . Ampicillin-Sulbactam (UNASYN) 3 g in sodium chloride 0.9 % 100 mL IVPB  3 g Intravenous Q6H Carrie Mew, MD      . aspirin chewable tablet 81 mg  81 mg Oral Daily Delman Kitten, MD      . carbidopa-levodopa (SINEMET IR) 25-100 MG per tablet immediate release 1 tablet  1 tablet Oral BID Carrie Mew, MD      . Carbidopa-Levodopa ER (SINEMET CR) 25-100 MG tablet controlled release 1 tablet  1 tablet Oral QHS Delman Kitten, MD      . cholecalciferol (VITAMIN D3) tablet 1,000 Units  1,000 Units Oral Daily Delman Kitten, MD      . furosemide (LASIX) injection 20 mg  20 mg Intravenous Once Ivor Costa, MD      . hydrALAZINE (APRESOLINE) injection 5 mg  5  mg Intravenous Q2H PRN Lorretta Harp, MD      . oxybutynin (DITROPAN) tablet 5 mg  5 mg Oral Daily Sharyn Creamer, MD      . pantoprazole (PROTONIX) EC tablet 20 mg  20 mg Oral Daily Sharyn Creamer, MD      . rOPINIRole (REQUIP) tablet 0.25 mg  0.25 mg Oral BID Sharyn Creamer, MD   0.25 mg at 11/10/19 0209  . traMADol (ULTRAM) tablet 50 mg  50 mg Oral Q6H PRN Sharyn Creamer, MD       Current Outpatient Medications  Medication Sig Dispense Refill  . aspirin 81 MG chewable tablet Chew by mouth.    . carbidopa-levodopa (SINEMET) 25-100 MG tablet Take 1 tablet by mouth 2  (two) times daily.     . Carbidopa-Levodopa ER (SINEMET CR) 25-100 MG tablet controlled release Take 1 tablet by mouth at bedtime.   1  . Cholecalciferol (VITAMIN D3 PO) Take by mouth daily.    . clorazepate (TRANXENE) 3.75 MG tablet TAKE 1 TABLET BY MOUTH 1 TO 3 TIMES A DAY 90 tablet 1  . furosemide (LASIX) 20 MG tablet Take 1 tablet (20 mg total) by mouth every other day. 15 tablet 1  . methocarbamol (ROBAXIN) 750 MG tablet Take 1 tablet (750 mg total) by mouth every 6 (six) hours as needed for muscle spasms. 120 tablet 0  . metoprolol succinate (TOPROL-XL) 25 MG 24 hr tablet START WITH HALF TAB A DAY AND MAY INCREASE TO ONE TAB DAILY 30 tablet 3  . oxybutynin (DITROPAN) 5 MG tablet TAKE 1 TABLET BY MOUTH EVERY DAY 30 tablet 3  . pantoprazole (PROTONIX) 40 MG tablet TAKE 1 TABLET BY MOUTH DAILY FOR GERD 30 tablet 3  . polyethylene glycol (MIRALAX / GLYCOLAX) packet Take 17 g by mouth daily as needed for mild constipation.     Marland Kitchen rOPINIRole (REQUIP) 0.25 MG tablet ONE TO 3 TABS A DAY AS DIRECTED (Patient taking differently: Take 0.25 mg by mouth 2 (two) times daily. ) 90 tablet 3  . traMADol (ULTRAM) 50 MG tablet Take 1 tablet (50 mg total) by mouth every 6 (six) hours as needed. for pain 90 tablet 1     Discharge Medications: Please see discharge summary for a list of discharge medications.  Relevant Imaging Results:  Relevant Lab Results:   Additional Information SSN#: [id number]  Joseph Art, LCSWA

## 2019-11-10 NOTE — TOC Progression Note (Signed)
Transition of Care [facility]) - Progression Note    Patient Details  Name: Jennifer Snyder MRN: [id number] Date of Birth: 08-Oct-1935  Transition of [facility]) CM/SW Contact  Marina Goodell Phone Number: [phone] 11/10/2019, 12:58 PM  Clinical Narrative:    Patient has bed placement offer at [facility].   Expected Discharge Plan: Skilled Nursing Facility Barriers to Discharge: Continued Medical Work up, ED Awaiting Stat Approval (PASRR)  Expected Discharge Plan and Services Expected Discharge Plan: Skilled Nursing Facility     Post Acute Care Choice: Skilled Nursing Facility Living arrangements for the past 2 months: Single Family Home                                       Social Determinants of Health (SDOH) Interventions    Readmission Risk Interventions No flowsheet data found.

## 2019-11-10 NOTE — TOC Progression Note (Signed)
Transition of Care [facility]) - Progression Note    Patient Details  Name: Jennifer Snyder MRN: [id number] Date of Birth: August 10, 1935  Transition of [facility]) CM/SW Contact  Marina Goodell Phone Number: [phone] 11/10/2019, 9:27 AM  Clinical Narrative:     Spoke with patient's daughter Chloeanne Poteet [phone], with status update on SNF placement for patient.  This CSW informed Ms. Leeson that I would be sending out the bed placement requests to the SNFs and I would be getting in touch with her and the patient once I received a reply.  Ms. Batch she would come and visit the patient sometime today.  FL2 completed, sent for co-signature and PASRR #[id number]  Expected Discharge Plan: Skilled Nursing Facility Barriers to Discharge: Continued Medical Work up, ED Awaiting Stat Approval (PASRR)  Expected Discharge Plan and Services Expected Discharge Plan: Skilled Nursing Facility     Post Acute Care Choice: Skilled Nursing Facility Living arrangements for the past 2 months: Single Family Home                                       Social Determinants of Health (SDOH) Interventions    Readmission Risk Interventions No flowsheet data found.

## 2019-11-10 NOTE — H&P (Signed)
History and Physical    Jennifer Snyder OXB:[id number] DOB: 04-Sep-1935 DOA: 11/09/2019  Referring MD/NP/PA:   PCP: Lyndon Code, MD   Patient coming from:  The patient is coming from home.  At baseline, pt is dependent for most of ADL.        Chief Complaint: Generalized weakness, difficulty swallowing  HPI: Jennifer Snyder is a 84 y.o. female with medical history significant of hypertension, hyperlipidemia, GERD, peptic ulcer disease, Parkinson's disease, wheelchair-bound, who presents with generalized weakness.  Per her daughter (I called her daughter by phone), patient has been feeling weak recently, which has been progressively worsening.  She has fatigue, difficulty swallowing, difficulty talking, slow response.  No unilateral numbness or tingling to extremities.  No facial droop. Per her daughter, patient denies chest pain, shortness of breath, fever or chills.  She has intermittent mild dry cough.  No nausea, vomiting, diarrhea or abdominal pain.  Per her daughter, patient does not seem to have dysuria or burning on urination.  No urinary frequency.  ED Course: pt was found to have WBC 6.6, BNP 161 -->324, urinalysis (hazy appearance, large amount of leukocyte, no bacteria, WBC> 50), negative COVID-19 PCR, electrolytes renal function okay, temperature 97.2, elevated blood pressure 238/105 --> 182/86, heart rate 71, oxygen saturation 85% on room air --> 94% on 3 L nasal cannula oxygen.  CT head is negative for acute intracranial abnormalities.  Chest x-ray showed cardiomegaly and possible pulmonary edema and left pleural effusion, but CT of chest did not show significant pleural effusion, no evidence of infiltration for pneumonia.  # CT-chest 1. No significant pleural effusion.  No evidence of pneumonia. 2. Left lung base opacity on radiograph is secondary to large hiatal hernia and prominent epicardial fat pad. 3. Multi chamber cardiomegaly. Coronary artery calcifications. 4. Multifocal  atelectasis.  Review of Systems:   General: no fevers, chills, no body weight gain, has fatigue HEENT: no blurry vision, hearing changes or sore throat Respiratory: no dyspnea, coughing, wheezing CV: no chest pain, no palpitations GI: no nausea, vomiting, abdominal pain, diarrhea, constipation GU: no dysuria, burning on urination, increased urinary frequency, hematuria  Ext: has leg edema Neuro: no unilateral weakness, numbness, or tingling, no vision change or hearing loss Skin: no rash, no skin tear. MSK: No muscle spasm, no deformity, no limitation of range of movement in spin Heme: No easy bruising.  Travel history: No recent long distant travel.  Allergy:  Allergies  Allergen Reactions  . Codeine Other (See Comments)    crazy    Past Medical History:  Diagnosis Date  . Hyperlipidemia   . Hypertension   . Osteoarthritis   . Parkinson's disease (HCC)   . Peptic ulcer disease     Past Surgical History:  Procedure Laterality Date  . ABDOMINAL HERNIA REPAIR    . ADENOIDECTOMY  1994  . CATARACT EXTRACTION, BILATERAL  09/2014, 07/2014  . child birth  30, 71  . CHOLECYSTECTOMY  1989  . laser surgery on both eyes  11/2016  . left knee replacement  1993  . para esophogeal hernia repair    . TONSILLECTOMY  1944  . VAGINAL HYSTERECTOMY  1976    Social History:  reports that she has never smoked. She has never used smokeless tobacco. She reports that she does not drink alcohol or use drugs.  Family History:  Family History  Problem Relation Age of Onset  . Lung cancer Mother   . Hyperlipidemia Mother   . Hypertension  Mother   . Osteoarthritis Mother   . Stroke Mother      Prior to Admission medications   Medication Sig Start Date End Date Taking? Authorizing Provider  aspirin 81 MG chewable tablet Chew by mouth.   Yes [provider]  carbidopa-levodopa (SINEMET) 25-100 MG tablet Take 1 tablet by mouth 2 (two) times daily.  07/07/17  Yes [provider]  Carbidopa-Levodopa ER (SINEMET CR) 25-100 MG tablet controlled release Take 1 tablet by mouth at bedtime.  06/08/17  Yes [provider]  Cholecalciferol (VITAMIN D3 PO) Take by mouth daily.   Yes [provider]  clorazepate (TRANXENE) 3.75 MG tablet TAKE 1 TABLET BY MOUTH 1 TO 3 TIMES A DAY 07/22/19  Yes Boscia, Heather E, NP  furosemide (LASIX) 20 MG tablet Take 1 tablet (20 mg total) by mouth every other day. 04/13/19  Yes Boscia, Greer Ee, NP  methocarbamol (ROBAXIN) 750 MG tablet Take 1 tablet (750 mg total) by mouth every 6 (six) hours as needed for muscle spasms. 04/15/19  Yes Boscia, Greer Ee, NP  metoprolol succinate (TOPROL-XL) 25 MG 24 hr tablet START WITH HALF TAB A DAY AND MAY INCREASE TO ONE TAB DAILY 10/17/19  Yes Boscia, Heather E, NP  oxybutynin (DITROPAN) 5 MG tablet TAKE 1 TABLET BY MOUTH EVERY DAY 08/11/19  Yes Ronnell Freshwater, NP  pantoprazole (PROTONIX) 40 MG tablet TAKE 1 TABLET BY MOUTH DAILY FOR GERD 07/21/19  Yes Lavera Guise, MD  polyethylene glycol (MIRALAX / GLYCOLAX) packet Take 17 g by mouth daily as needed for mild constipation.    Yes [provider]  rOPINIRole (REQUIP) 0.25 MG tablet ONE TO 3 TABS A DAY AS DIRECTED Patient taking differently: Take 0.25 mg by mouth 2 (two) times daily.  12/25/17  Yes Lavera Guise, MD  traMADol (ULTRAM) 50 MG tablet Take 1 tablet (50 mg total) by mouth every 6 (six) hours as needed. for pain 06/14/19  Yes Ronnell Freshwater, NP    Physical Exam: Vitals:   11/10/19 0900 11/10/19 0930 11/10/19 1028 11/10/19 1030  BP: (!) 184/79 (!) 197/86 (!) 201/89 (!) 189/75  Pulse: 71 74  69  Resp: 10 12  12   Temp:      SpO2: 100% 98%  95%  Weight:      Height:       General: Not in acute distress HEENT:       Eyes: PERRL, EOMI, no scleral icterus.       ENT: No discharge from the ears and nose, no pharynx injection, no tonsillar enlargement.        Neck: postive JVD, no bruit, no mass  felt. Heme: No neck lymph node enlargement. Cardiac: S1/S2, RRR, No murmurs, No gallops or rubs. Respiratory: No rales, wheezing, rhonchi or rubs. GI: Soft, nondistended, nontender, no rebound pain, no organomegaly, BS present. GU: No hematuria Ext: has 2+ pitting leg edema bilaterally. 2+DP/PT pulse bilaterally. Musculoskeletal: No joint deformities, No joint redness or warmth, no limitation of ROM in spin. Skin: No rashes.  Neuro: Alert, oriented X3, cranial nerves II-XII grossly intact, slow response  Psych: Patient is not psychotic, no suicidal or hemocidal ideation.  Labs on Admission: I have personally reviewed following labs and imaging studies  CBC: Recent Labs  Lab 11/09/19 1620 11/10/19 0753  WBC 6.5 6.6  NEUTROABS  --  4.5  HGB 13.6 12.7  HCT 42.0 39.7  MCV 83.3 84.3  PLT 241 214  Basic Metabolic Panel: Recent Labs  Lab 11/09/19 1620 11/10/19 0753  NA 140 139  K 4.1 3.7  CL 104 102  CO2 28 28  GLUCOSE 130* 128*  BUN 24* 24*  CREATININE 0.79 0.63  CALCIUM 9.5 9.1   GFR: Estimated Creatinine Clearance: 51 mL/min (by C-G formula based on SCr of 0.63 mg/dL). Liver Function Tests: No results for input(s): AST, ALT, ALKPHOS, BILITOT, PROT, ALBUMIN in the last 168 hours. No results for input(s): LIPASE, AMYLASE in the last 168 hours. No results for input(s): AMMONIA in the last 168 hours. Coagulation Profile: No results for input(s): INR, PROTIME in the last 168 hours. Cardiac Enzymes: No results for input(s): CKTOTAL, CKMB, CKMBINDEX, TROPONINI in the last 168 hours. BNP (last 3 results) No results for input(s): PROBNP in the last 8760 hours. HbA1C: No results for input(s): HGBA1C in the last 72 hours. CBG: Recent Labs  Lab 11/09/19 1647  GLUCAP 107*   Lipid Profile: No results for input(s): CHOL, HDL, LDLCALC, TRIG, CHOLHDL, LDLDIRECT in the last 72 hours. Thyroid Function Tests: No results for input(s): TSH, T4TOTAL, FREET4, T3FREE, THYROIDAB in  the last 72 hours. Anemia Panel: No results for input(s): VITAMINB12, FOLATE, FERRITIN, TIBC, IRON, RETICCTPCT in the last 72 hours. Urine analysis:    Component Value Date/Time   COLORURINE YELLOW (A) 11/09/2019 1649   APPEARANCEUR HAZY (A) 11/09/2019 1649   APPEARANCEUR Clear 08/31/2013 0829   LABSPEC 1.009 11/09/2019 1649   LABSPEC 1.003 08/31/2013 0829   PHURINE 7.0 11/09/2019 1649   GLUCOSEU NEGATIVE 11/09/2019 1649   GLUCOSEU Negative 08/31/2013 0829   HGBUR NEGATIVE 11/09/2019 1649   BILIRUBINUR NEGATIVE 11/09/2019 1649   BILIRUBINUR Negative 07/06/2018 1158   BILIRUBINUR Negative 08/31/2013 0829   KETONESUR NEGATIVE 11/09/2019 1649   PROTEINUR NEGATIVE 11/09/2019 1649   UROBILINOGEN 0.2 07/06/2018 1158   NITRITE NEGATIVE 11/09/2019 1649   LEUKOCYTESUR LARGE (A) 11/09/2019 1649   LEUKOCYTESUR Trace 08/31/2013 0829   Sepsis Labs: @LABRCNTIP (procalcitonin:4,lacticidven:4) ) Recent Results (from the past 240 hour(s))  SARS CORONAVIRUS 2 (TAT 6-24 HRS) Nasopharyngeal Nasopharyngeal Swab     Status: None   Collection Time: 11/09/19  4:49 PM   Specimen: Nasopharyngeal Swab  Result Value Ref Range Status   SARS Coronavirus 2 NEGATIVE NEGATIVE Final    Comment: (NOTE) SARS-CoV-2 target nucleic acids are NOT DETECTED. The SARS-CoV-2 RNA is generally detectable in upper and lower respiratory specimens during the acute phase of infection. Negative results do not preclude SARS-CoV-2 infection, do not rule out co-infections with other pathogens, and should not be used as the sole basis for treatment or other patient management decisions. Negative results must be combined with clinical observations, patient history, and epidemiological information. The expected result is Negative. Fact Sheet for Patients: HairSlick.nohttps://www.fda.gov/media/138098/download Fact Sheet for Healthcare Providers: quierodirigir.comhttps://www.fda.gov/media/138095/download This test is not yet approved or cleared by the  [place] FDA and  has been authorized for detection and/or diagnosis of SARS-CoV-2 by FDA under an Emergency Use Authorization (EUA). This EUA will remain  in effect (meaning this test can be used) for the duration of the COVID-19 declaration under Section 56 4(b)(1) of the Act, 21 U.S.C. section 360bbb-3(b)(1), unless the authorization is terminated or revoked sooner. Performed at [facility], [address] [place], [place] [address]      Radiological Exams on Admission: DG Chest 2 View  Result Date: 11/09/2019 CLINICAL DATA:  Weakness EXAM: CHEST - 2 VIEW COMPARISON:  04/27/2012 FINDINGS: Cardiomegaly left lower lobe  airspace opacity with probable left effusion. Right base atelectasis or infiltrate. Small right effusion suspected. No acute bony abnormality. IMPRESSION: Cardiomegaly. Moderate to large left effusion and small right effusion. Bibasilar airspace opacities, left greater than right could reflect atelectasis or infiltrates. Electronically Signed   By: Charlett Nose M.D.   On: 11/09/2019 17:57   CT Head Wo Contrast  Result Date: 11/09/2019 CLINICAL DATA:  84 year old female with weakness. EXAM: CT HEAD WITHOUT CONTRAST TECHNIQUE: Contiguous axial images were obtained from the base of the skull through the vertex without intravenous contrast. COMPARISON:  Head CT dated 04/27/2012. FINDINGS: Brain: Moderate age-related atrophy and chronic microvascular ischemic changes. Old right occipital infarct. There is no acute intracranial hemorrhage. No mass effect or midline shift. No extra-axial fluid collection. Vascular: No hyperdense vessel or unexpected calcification. Skull: Normal. Negative for fracture or focal lesion. Sinuses/Orbits: No acute finding. Other: None IMPRESSION: 1. No acute intracranial pathology. 2. Moderate age-related atrophy and chronic microvascular ischemic changes. Old right occipital infarct. Electronically Signed   By: Elgie Collard M.D.   On: 11/09/2019  19:32   CT Chest Wo Contrast  Result Date: 11/09/2019 CLINICAL DATA:  Hypertension. Weakness. Effusion versus pneumonia on radiograph EXAM: CT CHEST WITHOUT CONTRAST TECHNIQUE: Multidetector CT imaging of the chest was performed following the standard protocol without IV contrast. COMPARISON:  Radiograph earlier this day FINDINGS: Cardiovascular: Aortic atherosclerosis and tortuosity. Multi chamber cardiomegaly mitral annulus and coronary artery calcifications. Minimal pericardial fluid without significant effusion. Mediastinum/Nodes: Large hiatal hernia with greater than 50% of the stomach intrathoracic. Mediastinal lipomatosis adjacent to the left heart border. No esophageal wall thickening. No enlarged mediastinal lymph nodes. No visualized thyroid nodule. Lungs/Pleura: Compressive atelectasis in the left lower lobe adjacent to hiatal hernia. Dependent atelectasis in both lower lobes. Chronic volume loss in the medial right middle lobe. No evidence of pneumonia. No significant pleural effusion. No evidence of pulmonary edema. Upper Abdomen: No acute findings. Musculoskeletal: Exaggerated thoracic kyphosis. Mild degenerative change in the thoracic spine. There are no acute or suspicious osseous abnormalities. Chronic degenerative change of both shoulders, right greater than left. IMPRESSION: 1. No significant pleural effusion.  No evidence of pneumonia. 2. Left lung base opacity on radiograph is secondary to large hiatal hernia and prominent epicardial fat pad. 3. Multi chamber cardiomegaly. Coronary artery calcifications. 4. Multifocal atelectasis. Aortic Atherosclerosis (ICD10-I70.0). Electronically Signed   By: Narda Rutherford M.D.   On: 11/09/2019 19:41   DG Chest Portable 1 View  Result Date: 11/10/2019 CLINICAL DATA:  Increased oxygen requirement, hypoxia, hypertension, Parkinson's EXAM: PORTABLE CHEST 1 VIEW COMPARISON:  Portable exam 0746 hours compared to 11/09/2019 FINDINGS: Enlargement of cardiac  silhouette. Atherosclerotic calcification aorta. Mildly decreased LEFT pleural effusion. Persistent bibasilar atelectasis. Progressive perihilar opacities question pulmonary edema. No pneumothorax or acute osseous findings. IMPRESSION: Increased perihilar opacities question pulmonary edema. Decreased LEFT pleural effusion with persistent bibasilar atelectasis. Electronically Signed   By: Ulyses Southward M.D.   On: 11/10/2019 08:14     EKG: Independently reviewed.  Sinus rhythm, QTC 448, LAD, T wave inversion in lead III/aVF and V6  Assessment/Plan Principal Problem:   Hypertensive urgency Active Problems:   Parkinson disease (HCC)   Essential hypertension   UTI (urinary tract infection)   GERD (gastroesophageal reflux disease)   Generalized weakness   Acute respiratory failure with hypoxia (HCC)   Acute CHF (congestive heart failure) (HCC)   Hypertensive urgency and hx of essential hypertension: Bp 238/ 105 -->182/ 86.  -admit to progressive  unit as inpatient -As needed IV hydralazine -Continue metoprolol -Patient is on IV Lasix -will add amlodipine 5 mg daily now  Acute respiratory failure with hypoxia due to possible acute CHF (congestive heart failure): pt has 2+ bilateral lower leg edema, positive JVD, elevated BNP 161 -->234, chest x-ray with possible pulmonary edema, clinically consistent with acute CHF.  Not sure which type of CHF, no 2D echo on record.  Given long history of hypertension, patient may have diastolic CHF.  Another differential diagnosis is aspiration pneumonia given difficulty swallowing. Unasyn was started in ED. SLP is done --> Diet DYS 2  -Start Lasix 20 mg daily -f/u 2d echo -f/u sputum culture -continue Unasyn  Parkinson disease (HCC): -continue Sinemet  Possible UTI (urinary tract infection): -received one dose of 3 g of fosfomycin in ED -Follow-up urine culture  GERD (gastroesophageal reflux disease): -Protonix  Generalized weakness: Likely due to  multifactorial etiology, including possible UTI, acute CHF, hypertensive urgency.  CT head is negative for acute intracranial abnormalities. -pt/OT -fall precaution    Inpatient status:  # Patient requires inpatient status due to high intensity of service, high risk for further deterioration and high frequency of surveillance required.  I certify that at the point of admission it is my clinical judgment that the patient will require inpatient hospital care spanning beyond 2 midnights from the point of admission.  . This patient has multiple chronic comorbidities including hypertension, hyperlipidemia, GERD, peptic ulcer disease, Parkinson's disease, wheelchair-bound . Now patient has presenting with generalized weakness, acute respiratory failure with hypoxia, possible acute CHF, and possible aspiration pneumonia, hypertensive urgency, UTI . The worrisome physical exam findings include bilateral leg edema . The initial radiographic and laboratory data are worrisome because of elevated BNP 161, positive urinalysis for UTI, . Current medical needs: please see my assessment and plan . Predictability of an adverse outcome (risk): Patient has multiple comorbidities as listed above. Now presents with generalized weakness, acute respiratory failure with hypoxia, possible acute CHF, and possible aspiration pneumonia, hypertensive urgency and UTI. Patient's presentation is highly complicated.  Patient is at high risk of deteriorating.  Will need to be treated in hospital for at least 2 days.            DVT ppx: SQ Lovenox Code Status: DNR per her daughter Family Communication:  Yes, patient's daughter by phone  Disposition Plan:  Anticipate discharge back to previous home environment Consults called: None Admission status: Progressive unit and seen patient   Date of Service 11/10/2019    Lorretta Harp [facility]   If 7PM-7AM, please contact night-coverage [email or web address] 11/10/2019,  11:04 AM

## 2019-11-10 NOTE — ED Notes (Signed)
Messaged Admitting MD regarding pt admission. Daughter of pt concerned about losing bed placement at [facility] if pt is to be admitted for an extended period of time.

## 2019-11-10 NOTE — ED Notes (Signed)
This RN called and spoke with Natalia Leatherwood, SLP and requested priority due to patient receiving Sinemet for parkinsons, per Natalia Leatherwood SLP will come and eval patient in approx 30 mins.

## 2019-11-10 NOTE — ED Notes (Signed)
EDP made aware of patient's new onset O2 requirements. Pt placed on 2L via [place], pt's O2 increased to 90%, pt placed on 3L via [place], pt's O2 increased to 94%.

## 2019-11-10 NOTE — ED Notes (Signed)
VORB received from Dr. Clyde Lundborg to cancel stroke swallow screen due to patient already having had SLP evaluation.

## 2019-11-10 NOTE — Progress Notes (Signed)
*  PRELIMINARY RESULTS* Echocardiogram 2D Echocardiogram has been performed.  Jennifer Snyder 11/10/2019, 3:24 PM

## 2019-11-10 NOTE — ED Notes (Signed)
This RN spoke with EDP regarding concerns of patient's new onset O2 requirements. Pt placed on cardiac monitor by this RN and Meagan, RN. Pt also appears altered from when she arrived yesterday. Pt is alert and asking for food, however noted slower response times and patient appears more confused than when she arrived yesterday. Pt remains turned to R side and purewick remains patent. EDP placed orders for chest X-ray and basic labs for patient.

## 2019-11-10 NOTE — ED Notes (Signed)
Pt provided with meal tray, this RN assisted patient with eating, pt ate 100% of meal tray at this time. Pt's daughter back to bedside. Pt tolerated well.

## 2019-11-10 NOTE — ED Notes (Signed)
Blood work collected by this and sent to lab, RT notified of VBG sent to lab at this time.

## 2019-11-10 NOTE — ED Notes (Signed)
This RN and Neurosurgeon gave pt a bed bath. Linen and gown changed. Warm blankets given. Pt resting comfortably.

## 2019-11-10 NOTE — ED Notes (Signed)
SLP at bedside to perform bedside swallow eval on patient.

## 2019-11-10 NOTE — Evaluation (Signed)
Clinical/Bedside Swallow Evaluation Patient Details  Name: Jennifer Snyder MRN: [id number] Date of Birth: 1936-06-20  Today's Date: 11/10/2019 Time: SLP Start Time (ACUTE ONLY): 0920 SLP Stop Time (ACUTE ONLY): 1015 SLP Time Calculation (min) (ACUTE ONLY): 55 min  Past Medical History:  Past Medical History:  Diagnosis Date  . Hyperlipidemia   . Hypertension   . Osteoarthritis   . Parkinson's disease (HCC)   . Peptic ulcer disease    Past Surgical History:  Past Surgical History:  Procedure Laterality Date  . ABDOMINAL HERNIA REPAIR    . ADENOIDECTOMY  1994  . CATARACT EXTRACTION, BILATERAL  09/2014, 07/2014  . child birth  44, 82  . CHOLECYSTECTOMY  1989  . laser surgery on both eyes  11/2016  . left knee replacement  1993  . para esophogeal hernia repair    . TONSILLECTOMY  1944  . VAGINAL HYSTERECTOMY  1976   HPI:  Pt is a 84 y.o. female with medical history significant of hypertension, hyperlipidemia, GERD, peptic ulcer disease, Parkinson's disease, wheelchair-bound, who presents with generalized weakness. Per Daughter's report in chart notes, patient has been feeling weak recently, which has been progressively worsening. She has fatigue, difficulty swallowing, difficulty talking, slow response. Per Daughter, She has home care, but over the last several weeks she is just slowly been progressing with increasing home care needs to the point it is difficult to transfer her now from bed to toilet and provide care for her in her own home despite having 24/7 care at home.  Chest CT: "Left lung base opacity on radiograph is secondary to Large Hiatal Hernia; No evidence of pneumonia". A Barium GI study in 2012 revealed: "marked changes of presbyesophagus; tertiary contractions".    Assessment / Plan / Recommendation Clinical Impression  Pt appeared to present w/ grossly adequate oropharyngeal phase swallowing function w/ modified diet and aspiration precautions as support; no overt  clinical s/s of aspiration noted during po trials given. Pt appears at reduced risk for aspiration when following precautions and given support. Pt also has a Baseline h/o Large Hiatal Hernia and Presbyesophagus -- both of these Esophageal issues can cause significant Dysmotility w/ retrograde bolus activity thus increasing risk for aspiration of Reflux material which can then impact the Pulmonary status. Overally intake is challenged as well. During po trials today, pt required full positioning support upright in bed and assistance w/ feeding d/t overall weakness. Pt consumed trials of thin liquids VIA CUP, no straws; purees/softened solids. No overt s/s of aspiration noted; no decline in vocal quality or O2 sats during/post trials. Oral phase grossly wfl for bolus control, A-P transfer timing, and oral clearing w/ all consistencies -- except min increased time needed w/ increased textured trials/solids d/t missing Dentition. Softening the foods w/ applesauce was helpful. OM exam revealed generalized oral weakness, min lingual protrusion weakness. Speech clear; oral clearing appropriate. Recommend a mech soft diet w/ MINCED meats, gravies added; thin liquids VIA CUP, no straws. Recommend aspiration precautions and support at meals. Increased TIME at meals for rest breaks d/t Esophageal Dysmotility. Recommend REFLUX precautions; Pills in Puree - whole vs crushed needs. Support w/ feeding at meals as needed. NSG to reconsult if any new issues arise while admitted.  SLP Visit Diagnosis: Dysphagia, unspecified (R13.10)(baseline Esophageal Dysmotility issues)    Aspiration Risk  Mild aspiration risk;Risk for inadequate nutrition/hydration(reduced w/ precautions)    Diet Recommendation  Mech Soft diet w/ MINCED meats, gravies and condiments added to moisten foods; Thin liquids  VIA CUP - NO Straws. General aspiration and REFLUX precautions d/t Hiatal Hernia, Parkinson's Dis., and Presbyesophagus. Support at meals w/  feeding.  Medication Administration: Whole meds with puree(vs need to Crush in puree)    Other  Recommendations Recommended Consults: Consider GI evaluation;Consider esophageal assessment(Dietician f/u for supplement support) Oral Care Recommendations: Oral care BID;Oral care before and after PO;Staff/trained caregiver to provide oral care Other Recommendations: (n/a)   Follow up Recommendations None      Frequency and Duration (n/a)  (n/a)       Prognosis Prognosis for Safe Diet Advancement: Fair Barriers to Reach Goals: Time post onset;Severity of deficits      Swallow Study   General Date of Onset: 11/09/19 HPI: Pt is a 84 y.o. female with medical history significant of hypertension, hyperlipidemia, GERD, peptic ulcer disease, Parkinson's disease, wheelchair-bound, who presents with generalized weakness. Per Daughter's report in chart notes, patient has been feeling weak recently, which has been progressively worsening. She has fatigue, difficulty swallowing, difficulty talking, slow response. Per Daughter, She has home care, but over the last several weeks she is just slowly been progressing with increasing home care needs to the point it is difficult to transfer her now from bed to toilet and provide care for her in her own home despite having 24/7 care at home.  Chest CT: "Left lung base opacity on radiograph is secondary to Large Hiatal Hernia; No evidence of pneumonia". A Barium GI study in 2012 revealed: "marked changes of presbyesophagus; tertiary contractions".  Type of Study: Bedside Swallow Evaluation Previous Swallow Assessment: none Diet Prior to this Study: Regular;Thin liquids Temperature Spikes Noted: No(wbc 6.6) Respiratory Status: Nasal cannula(3L) History of Recent Intubation: No Behavior/Cognition: Alert;Cooperative;Pleasant mood;Distractible;Requires cueing Oral Cavity Assessment: Dry(min) Oral Care Completed by SLP: Yes Oral Cavity - Dentition: Poor  condition;Missing dentition Vision: Functional for self-feeding Self-Feeding Abilities: Able to feed self;Needs assist;Needs set up;Total assist Patient Positioning: Upright in bed(needed full support) Baseline Vocal Quality: Low vocal intensity(baseline Parkinson's Dis.) Volitional Cough: (fair) Volitional Swallow: Able to elicit    Oral/Motor/Sensory Function Overall Oral Motor/Sensory Function: Generalized oral weakness Facial Symmetry: Within Functional Limits Lingual Symmetry: Within Functional Limits Velum: Within Functional Limits Mandible: Within Functional Limits   Ice Chips Ice chips: Within functional limits Presentation: Spoon(fed; 8 trials)   Thin Liquid Thin Liquid: Within functional limits Presentation: Cup;Self Fed(10 trials)    Nectar Thick Nectar Thick Liquid: Not tested   Honey Thick Honey Thick Liquid: Not tested   Puree Puree: Within functional limits Presentation: Spoon(fed; 8 trials)   Solid     Solid: Impaired Presentation: Spoon(fed; 8 trials) Oral Phase Impairments: Impaired mastication(missing Dentition) Oral Phase Functional Implications: Impaired mastication(missing Dentition) Pharyngeal Phase Impairments: (none)       Orinda Kenner, MS, CCC-SLP Chayla Shands 11/10/2019,3:09 PM

## 2019-11-10 NOTE — ED Notes (Signed)
SLP Recommendations: chop meats up well, give meds in applesauce, no straws for patient.

## 2019-11-10 NOTE — ED Notes (Signed)
This RN to bedside at this time, abx initiated per MD order. Pt resting in bed with eyes closed, awaiting SLP arrival to bedside to perform SLP eval prior to PO medication administration. Explained to patinet. Pt states understanding. Call bell within reach. VSS, pt noted to remain hypertensive at this time.

## 2019-11-10 NOTE — TOC Progression Note (Signed)
Transition of Care [facility]) - Progression Note    Patient Details  Name: Jennifer Snyder MRN: [id number] Date of Birth: July 27, 1936  Transition of [facility]) CM/SW Contact  Marina Goodell Phone Number: [phone] 11/10/2019, 2:15 PM  Clinical Narrative:     Spoke with patient ans her daughter Jasmine December about bed offer at [facility].  Jasmine December wanted to know about insurance authorization and how that would work.  This CSW explained she would contact [facility] about the insurance authorization to see if they can start now or wait until the patient is ready to discharge from inpatient care.  This CSW requested if the patient and her daughter had any other concerns or question to contact me.  Expected Discharge Plan: Skilled Nursing Facility Barriers to Discharge: Continued Medical Work up, ED Awaiting Stat Approval (PASRR)  Expected Discharge Plan and Services Expected Discharge Plan: Skilled Nursing Facility     Post Acute Care Choice: Skilled Nursing Facility Living arrangements for the past 2 months: Single Family Home                                       Social Determinants of Health (SDOH) Interventions    Readmission Risk Interventions No flowsheet data found.

## 2019-11-10 NOTE — TOC Progression Note (Signed)
Transition of Care [facility]) - Progression Note    Patient Details  Name: Jennifer Snyder MRN: [id number] Date of Birth: 11-08-1935  Transition of Care [facility] CM/SW Contact  Marina Goodell Phone Number: [phone] 11/10/2019, 9:51 AM  Clinical Narrative:     FL2 sent to patient referred SNFs; [facility] and [facility]   Expected Discharge Plan: Skilled Nursing Facility Barriers to Discharge: Continued Medical Work up, ED Awaiting Stat Approval [employer])  Expected Discharge Plan and Services Expected Discharge Plan: Skilled Nursing Facility     Post Acute Care Choice: Skilled Nursing Facility Living arrangements for the past 2 months: Single Family Home                                       Social Determinants of Health (SDOH) Interventions    Readmission Risk Interventions No flowsheet data found.

## 2019-11-10 NOTE — ED Notes (Signed)
This RN and Raquel, RN checked pt undergarments which were dry. Turned pt to her right side, relieving pressure on her left and also placed towel pt ankles. Pt states that she feels comfortable and denies any further needs at this time.

## 2019-11-10 NOTE — ED Notes (Signed)
Dr. Clyde Lundborg called and spoke with daughter regarding admission length. No other questions at this time from family.  Pt comfortable, call bell within reach. NAD noted.

## 2019-11-10 NOTE — ED Notes (Signed)
This RN attempted to call patient's daughter Jasmine December [phone], no answer at this time.

## 2019-11-10 NOTE — ED Notes (Signed)
This RN accidentally verified pt SpO2 of 85%, pt actual SpO2 is 95%.

## 2019-11-10 NOTE — TOC Progression Note (Signed)
Transition of Care [facility]) - Progression Note    Patient Details  Name: Jennifer Snyder MRN: [id number] Date of Birth: Jan 20, 1936  Transition of Care [facility]) CM/SW Contact  Marina Goodell Phone Number: [phone] 11/10/2019, 3:18 PM  Clinical Narrative:     Sherron Monday with Verlon Au at [facility] [phone]. This CSW informed Verlon Au the patient was going to be admitted and there was a question about starting authorization.  Verlon Au stated that the authorization process should begin once the patient has been admitted.  She requested that whoever in [facility] is on the floor the patient is placed in to please call her, when the patient is admitted.  Expected Discharge Plan: Skilled Nursing Facility Barriers to Discharge: Continued Medical Work up, ED Awaiting Stat Approval (PASRR)  Expected Discharge Plan and Services Expected Discharge Plan: Skilled Nursing Facility     Post Acute Care Choice: Skilled Nursing Facility Living arrangements for the past 2 months: Single Family Home                                       Social Determinants of Health (SDOH) Interventions    Readmission Risk Interventions No flowsheet data found.

## 2019-11-10 NOTE — ED Notes (Signed)
This Manufacturing engineer with admitting MD and SLP, pt to be on Diet Dys 3 due to poor dentition and aspiration risk, admitting MD aware, initially ordered fluid restriction, VORB to cancel fluid restriction at this time.

## 2019-11-10 NOTE — ED Notes (Signed)
This RN adjusted pt pillows and checked pt undergarments which were clean. Pt denies any needs at this time.

## 2019-11-10 NOTE — Progress Notes (Signed)
OT Cancellation Note  Patient Details Name: Jennifer Snyder MRN: [id number] DOB: April 24, 1936   Cancelled Treatment:    Reason Eval/Treat Not Completed: Medical issues which prohibited therapy  OT consult received and chart reviewed. Upon chart review this AM, Pt's SBP up to 201 at 1028 and pt's current primary dx hypertensive urgency. Will wait until pt's BP with somewhat better control to attempt OT evaluation. Thank you.  Rejeana Brock, MS, OTR/L ascom [phone] 11/10/19, 11:07 AM

## 2019-11-10 NOTE — ED Notes (Signed)
Pt resting in bed, and appears comfortable. Easily arousable and denies any needs at this time. Respirations are equal and unlabored, no signs of acute distress.

## 2019-11-10 NOTE — ED Notes (Signed)
Bed bath performed by this RN and Meagan, RN. Clean sheets, clean brief, clean socks, new purewick applied. Wounds noted and documented by this RN. Mepilex applied by this RN to sacral area. Pt repositioned to L side to prevent skin break down. Pt's daughter at bedside, pt's shirt and pants placed into patient belonging's bag and given to patient's daughter.

## 2019-11-11 DIAGNOSIS — L8990 Pressure ulcer of unspecified site, unspecified stage: Secondary | ICD-10-CM | POA: Insufficient documentation

## 2019-11-11 DIAGNOSIS — Z7189 Other specified counseling: Secondary | ICD-10-CM

## 2019-11-11 DIAGNOSIS — Z515 Encounter for palliative care: Secondary | ICD-10-CM

## 2019-11-11 DIAGNOSIS — I160 Hypertensive urgency: Principal | ICD-10-CM

## 2019-11-11 LAB — BASIC METABOLIC PANEL
Anion gap: 11 (ref 5–15)
BUN: 18 mg/dL (ref 8–23)
CO2: 27 mmol/L (ref 22–32)
Calcium: 9.1 mg/dL (ref 8.9–10.3)
Chloride: 97 mmol/L — ABNORMAL LOW (ref 98–111)
Creatinine, Ser: 0.6 mg/dL (ref 0.44–1.00)
GFR calc Af Amer: >60 mL/min (ref >60)
GFR calc non Af Amer: >60 mL/min (ref >60)
Glucose, Bld: 164 mg/dL — ABNORMAL HIGH (ref 70–99)
Potassium: 3.2 mmol/L — ABNORMAL LOW (ref 3.5–5.1)
Sodium: 135 mmol/L (ref 135–145)

## 2019-11-11 LAB — CBC
HCT: 42.6 % (ref 36.0–46.0)
Hemoglobin: 13.6 g/dL (ref 12.0–15.0)
MCH: 26.8 pg (ref 26.0–34.0)
MCHC: 31.9 g/dL (ref 30.0–36.0)
MCV: 83.9 fL (ref 80.0–100.0)
Platelets: 215 10*3/uL (ref 150–400)
RBC: 5.08 MIL/uL (ref 3.87–5.11)
RDW: 15.5 % (ref 11.5–15.5)
WBC: 8.7 10*3/uL (ref 4.0–10.5)
nRBC: 0 % (ref 0.0–0.2)

## 2019-11-11 LAB — MAGNESIUM: Magnesium: 1.8 mg/dL (ref 1.7–2.4)

## 2019-11-11 LAB — URINE CULTURE
Culture: 30000 — AB
Special Requests: NORMAL

## 2019-11-11 MED ORDER — METOPROLOL TARTRATE 25 MG PO TABS
25.0000 mg | ORAL_TABLET | Freq: Two times a day (BID) | ORAL | Status: DC
  Administered 2019-11-11 – 2019-11-12 (×3): 25 mg via ORAL
  Filled 2019-11-11 (×3): qty 1

## 2019-11-11 MED ORDER — ALPRAZOLAM 0.25 MG PO TABS
0.2500 mg | ORAL_TABLET | Freq: Once | ORAL | Status: AC
  Administered 2019-11-11: 02:00:00 0.25 mg via ORAL
  Filled 2019-11-11: qty 1

## 2019-11-11 MED ORDER — POTASSIUM CHLORIDE 20 MEQ PO PACK
40.0000 meq | PACK | Freq: Once | ORAL | Status: AC
  Administered 2019-11-11: 12:00:00 40 meq via ORAL
  Filled 2019-11-11: qty 2

## 2019-11-11 MED ORDER — PANTOPRAZOLE SODIUM 40 MG PO TBEC
40.0000 mg | DELAYED_RELEASE_TABLET | Freq: Every day | ORAL | Status: DC
  Administered 2019-11-12: 40 mg via ORAL
  Filled 2019-11-11: qty 1

## 2019-11-11 MED ORDER — CLORAZEPATE DIPOTASSIUM 7.5 MG PO TABS
3.7500 mg | ORAL_TABLET | Freq: Three times a day (TID) | ORAL | Status: DC
  Administered 2019-11-11 – 2019-11-12 (×4): 3.75 mg via ORAL
  Filled 2019-11-11 (×4): qty 1

## 2019-11-11 NOTE — TOC Progression Note (Signed)
Transition of Care [facility]) - Progression Note    Patient Details  Name: Jennifer Snyder MRN: [id number] Date of Birth: 1936-01-29  Transition of Care [facility]) CM/SW Contact  Maree Krabbe, LCSW Phone Number: 11/11/2019, 4:21 PM  Clinical Narrative:   CSW spoke with [employer] to determine status of insurance auth--still pending at this time. Pt's son notified. [employer] does authorize on the weekend.    Expected Discharge Plan: Skilled Nursing Facility Barriers to Discharge: Continued Medical Work up, ED Awaiting Stat Approval (PASRR)  Expected Discharge Plan and Services Expected Discharge Plan: Skilled Nursing Facility     Post Acute Care Choice: Skilled Nursing Facility Living arrangements for the past 2 months: Single Family Home                                       Social Determinants of Health (SDOH) Interventions    Readmission Risk Interventions No flowsheet data found.

## 2019-11-11 NOTE — Evaluation (Addendum)
Physical Therapy Evaluation Patient Details Name: Jennifer Snyder MRN: [id number] DOB: [birth year] Today's Date: 11/11/2019   History of Present Illness  Pt is an 84 y.o. female with medical history significant of hypertension, hyperlipidemia, GERD, peptic ulcer disease, Parkinson's disease, wheelchair-bound, who presents with generalized weakness. Workup consistent with potential acute CHF, hypertensive emergency, UTI, acute respiratory failure.    Clinical Impression  Pt lethargic but able to follow commands, oriented to self only. Pt questionable historian but stated she uses a WC, unclear how she transfers to the wheelchair. Per the chart review pt has experienced a decline in functional abilities recently.  The patient was able to perform supine exercises with physical assist, RLE strength greater than LLE, as well as participate with UE exercises as well. Predominantly AAROM to perform, transitioned to PROM with pt fatigue. Supine <> sit with total assistx2, pt unable to maintain upright seated balance without modA, and endorsed feeling cold. Rolling performed with total assistx2 (pt able to hold onto bed rail once hand was placed by PT to help maintain sidelying position.  Overall the patient demonstrated deficits (see "PT Problem List") that impede the patient's functional abilities, safety, and mobility and would benefit from skilled PT intervention. Recommendation is SNF due to current level of assistance needed.  *Addendum* PT spoke with family later in PM, and pt much more awake, interactive. Per family pt has had an acute decline in mobility requiring 2 person assist for stand pivot transfers.      Follow Up Recommendations SNF   Equipment Recommendations  Other (comment)(TBD)    Recommendations for Other Services       Precautions / Restrictions Precautions Precautions: Fall Restrictions Weight Bearing Restrictions: No      Mobility  Bed Mobility Overal bed mobility: Needs  Assistance Bed Mobility: Sit to Supine;Supine to Sit;Rolling Rolling: Max assist;+2 for physical assistance   Supine to sit: Total assist;HOB elevated Sit to supine: Total assist;+2 for physical assistance   General bed mobility comments: Pt able to hold unto bed rail when hand placed there by PT to assist with sidelying position  Transfers                 General transfer comment: deferred  Ambulation/Gait                Stairs            Wheelchair Mobility    Modified Rankin (Stroke Patients Only)       Balance Overall balance assessment: Needs assistance Sitting-balance support: Feet supported Sitting balance-Leahy Scale: Zero                                       Pertinent Vitals/Pain Pain Assessment: Faces Faces Pain Scale: Hurts a little bit Pain Location: Pt reported R shoulder pain Pain Intervention(s): Limited activity within patient's tolerance;Repositioned    Home Living                   Additional Comments: Pt poor historian, stated she does use a WC, unclear about how she transfers to the chair.    Prior Function                 Hand Dominance        Extremity/Trunk Assessment   Upper Extremity Assessment Upper Extremity Assessment: Generalized weakness    Lower Extremity Assessment Lower Extremity Assessment: Generalized  weakness    Cervical / Trunk Assessment Cervical / Trunk Assessment: Kyphotic  Communication      Cognition Arousal/Alertness: Lethargic Behavior During Therapy: WFL for tasks assessed/performed;Flat affect                                   General Comments: pt oriented to self, disoriented x3      General Comments      Exercises General Exercises - Lower Extremity Ankle Circles/Pumps: PROM;Both;10 reps Heel Slides: AAROM;Strengthening;Both;10 reps Hip ABduction/ADduction: AAROM;Strengthening;Both;10 reps Straight Leg Raises: PROM;Left;10  reps;AROM;Right;Strengthening Other Exercises Other Exercises: shoulder flexion bilaterally, PROM, elbow flexion/extension bilaterally, very minimal muscle activation noted   Assessment/Plan    PT Assessment Patient needs continued PT services  PT Problem List Decreased strength;Decreased mobility;Decreased range of motion;Decreased activity tolerance;Decreased balance;Decreased knowledge of use of DME       PT Treatment Interventions DME instruction;Therapeutic exercise;Balance training;Therapeutic activities;Patient/family education;Neuromuscular re-education;Wheelchair mobility training;Functional mobility training    PT Goals (Current goals can be found in the Care Plan section)  Acute Rehab PT Goals PT Goal Formulation: Patient unable to participate in goal setting Time For Goal Achievement: 11/25/19 Potential to Achieve Goals: Poor    Frequency Min2x/wk  Barriers to discharge        Co-evaluation               AM-PAC PT "6 Clicks" Mobility  Outcome Measure Help needed turning from your back to your side while in a flat bed without using bedrails?: Total Help needed moving from lying on your back to sitting on the side of a flat bed without using bedrails?: Total Help needed moving to and from a bed to a chair (including a wheelchair)?: Total Help needed standing up from a chair using your arms (e.g., wheelchair or bedside chair)?: Total Help needed to walk in hospital room?: Total Help needed climbing 3-5 steps with a railing? : Total 6 Click Score: 6    End of Session   Activity Tolerance: Patient limited by lethargy Patient left: in bed;with call bell/phone within reach(quarter turned with nursing) Nurse Communication: Mobility status PT Visit Diagnosis: Other abnormalities of gait and mobility (R26.89);Muscle weakness (generalized) (M62.81)    Time: 5643-3295 PT Time Calculation (min) (ACUTE ONLY): 23 min   Charges:   PT Evaluation $PT Eval High  Complexity: 1 High PT Treatments $Therapeutic Exercise: 8-22 mins       Olga Coaster PT, DPT 1:06 PM,11/11/19

## 2019-11-11 NOTE — Evaluation (Addendum)
Occupational Therapy Evaluation Patient Details Name: Jennifer Snyder MRN: [id number] DOB: 1936/06/26 Today's Date: 11/11/2019    History of Present Illness Pt is an 84 y.o. female with medical history significant of hypertension, hyperlipidemia, GERD, peptic ulcer disease, Parkinson's disease, wheelchair-bound, who presents with generalized weakness. Workup consistent with potential acute CHF, hypertensive emergency, UTI, acute respiratory failure.   Clinical Impression   Pt was seen for OT evaluation this date. Prior to hospital admission, pt was requiring assistance for self care ADLs and ADL transfers/mobility with w/c from aide. Pt lives alone in [facility] with ramped entrance next door to her daughter and has one on one [facility] aide assistance throughout the day. Pt has in recent weeks been requiring two aides per shidft to assist with ADL transfers. Currently pt demonstrates impairments as described below (See OT problem list) which functionally limit her ability to perform ADL/self-care tasks. Pt currently requires MAX A with UB ADLs including self-feeding and TOTAL A with LB ADLs. Pt declines to engage in sup to sit transfer with OT on assessment but per PT note, 2 person assist required for sup to sit transition.  Pt would benefit from skilled OT to address noted impairments and functional limitations (see below for any additional details) in order to maximize safety and independence while minimizing falls risk and caregiver burden. Upon hospital discharge, recommend STR to maximize pt safety and return to PLOF. Anticipate hospital bed and hoyer lift may be necessary equipment for pt and caregiver safety for ADL transfers.    Follow Up Recommendations  SNF    Equipment Recommendations  Hospital bed;Other (comment)(mechanical lift)    Recommendations for Other Services       Precautions / Restrictions Precautions Precautions: Fall Restrictions Weight Bearing Restrictions: No      Mobility Bed  Mobility Overal bed mobility: Needs Assistance Bed Mobility: Rolling Rolling: Max assist;+2 for physical assistance      Transfers                 General transfer comment: deferred    Balance                                      ADL either performed or assessed with clinical judgement   ADL Overall ADL's : Needs assistance/impaired                                       General ADL Comments: Pt requires MAX A for self-feeding, makes some effort to hold a cup, but is unable to successfully d/t weak grasp and unable to complete arc of motion to mouth. Pt required TOTAL A for all LB ADLs.     Vision   Additional Comments: unable to formally assess d/t cognition, but when pt is visually attending (~25% of session), appears to track appropriately     Perception     Praxis      Pertinent Vitals/Pain Pain Assessment: Faces Faces Pain Scale: Hurts a little bit Pain Location: Pt reported R shoulder pain, history of frozen shoulder per son Pain Descriptors / Indicators: Tender Pain Intervention(s): Limited activity within patient's tolerance;Repositioned     Hand Dominance     Extremity/Trunk Assessment Upper Extremity Assessment Upper Extremity Assessment: Generalized weakness(unable to raise R UE past 1/4 arc of motion, L UE past 1/3  arc of motion with increased time and effort, grasp bilaterally 3-/5)   Lower Extremity Assessment Lower Extremity Assessment: Defer to PT evaluation;Generalized weakness   Cervical / Trunk Assessment Cervical / Trunk Assessment: Kyphotic   Communication Communication Communication: Other (comment)(very soft, breathy voice. Very limited verbalizations during session, but is mostly appropriate conversationally when she does engage.)   Cognition Arousal/Alertness: Lethargic Behavior During Therapy: WFL for tasks assessed/performed;Flat affect Overall Cognitive Status: History of cognitive impairments -  at baseline                                 General Comments: Pt's son reports that pt is sharp when it comes to situational information, however, she worries heavily and it is not always about relevant/real concerns. Pt mostly conversationally appropriate on assessment, but is only oriented to self.   General Comments       Exercises Other Exercises Other Exercises: OT facilitates education re: role of OT in acute setting. Pt's son Arlys John indicates that pt has not typically been willing to participate in therapy in the past, but pt was agreeable this date to continued therapy in acute setting.   Shoulder Instructions      Home Living Family/patient expects to be discharged to:: Private residence Living Arrangements: Alone Available Help at Discharge: Family;Personal care attendant(Pt's daughter lives next door, Pt has round the clock care-givers) Type of Home: House Home Access: Ramped entrance     Home Layout: One level     Bathroom Shower/Tub: Walk-in shower         Home Equipment: Shower seat - built in;Wheelchair - Careers adviser (comment)(lift recliner)   Additional Comments: Pt poor historian, stated she does use a WC, unclear about how she transfers to the chair.      Prior Functioning/Environment Level of Independence: Needs assistance  Gait / Transfers Assistance Needed: Pt is poor historian, pt's son, Arlys John, present throughout and provides PLOF/home layout information. Pt w/c bound at baseline for fxl mobility. Cannot self-propel. Pt's aides propel her. Was able to SPS to w/c with one aide, has recently required assist of two. Pt's son reports that they have had and used Michiel Sites in the past, but that pt was not amenable to use. ADL's / Homemaking Assistance Needed: Pt is MAX to total assist for most ADLs at baseline, but was apparently able to moderately contribute to self-feeding, grooming and ADL transfers on some level   Comments: Pt originally had HH  aides 24/7 with just 1 on 1, but has, in recent weeks, required 2 aides per shift, per her son's report.        OT Problem List: Decreased strength;Decreased range of motion;Decreased activity tolerance;Impaired balance (sitting and/or standing);Decreased coordination;Decreased cognition;Decreased knowledge of use of DME or AE;Cardiopulmonary status limiting activity;Impaired UE functional use      OT Treatment/Interventions: Self-care/ADL training;Therapeutic exercise;Energy conservation;DME and/or AE instruction;Therapeutic activities;Patient/family education;Balance training    OT Goals(Current goals can be found in the care plan section) Acute Rehab OT Goals Patient Stated Goal: at time of eval, pt stated her goal was to "go to sleep". Son states his goal for her would be to go "wherever is safest for mom" OT Goal Formulation: With patient/family Time For Goal Achievement: 11/25/19 Potential to Achieve Goals: Good  OT Frequency: Min 2X/week   Barriers to D/C:            Co-evaluation  AM-PAC OT "6 Clicks" Daily Activity     Outcome Measure Help from another person eating meals?: A Lot Help from another person taking care of personal grooming?: A Lot Help from another person toileting, which includes using toliet, bedpan, or urinal?: Total Help from another person bathing (including washing, rinsing, drying)?: Total Help from another person to put on and taking off regular upper body clothing?: A Lot Help from another person to put on and taking off regular lower body clothing?: Total 6 Click Score: 9   End of Session Nurse Communication: Mobility status  Activity Tolerance: Patient limited by lethargy Patient left: in bed;with call bell/phone within reach;with family/visitor present  OT Visit Diagnosis: Muscle weakness (generalized) (M62.81);Other abnormalities of gait and mobility (R26.89)                Time: 1594-5859 OT Time Calculation (min): 38  min Charges:  OT General Charges $OT Visit: 1 Visit OT Evaluation $OT Eval Moderate Complexity: 1 Mod OT Treatments $Self Care/Home Management : 8-22 mins $Therapeutic Activity: 8-22 mins  Rejeana Brock, MS, OTR/L ascom [phone] 11/11/19, 3:55 PM

## 2019-11-11 NOTE — Plan of Care (Signed)
  Problem: Clinical Measurements: °Goal: Will remain free from infection °Outcome: Progressing °  °Problem: Safety: °Goal: Ability to remain free from injury will improve °Outcome: Progressing °  °Problem: Respiratory: °Goal: Ability to maintain adequate ventilation will improve °Outcome: Progressing °  °

## 2019-11-11 NOTE — Progress Notes (Signed)
Palliative: Thank you for this consult.  Unfortunately due to high volume of consults there will be a delay in a Palliative Provider seeing this patient. Palliative Medicine will return to service on 11/14/2019 and will see patient at that time.  No charge Tellis Spivak, NP Palliative Medicine Please call Palliative Medicine team phone with any questions [phone]. For individual providers please see AMION.  

## 2019-11-11 NOTE — Progress Notes (Addendum)
[facility]  - [place] [facility]   PATIENT NAME: Jennifer Snyder    MR#:  [id number]  DATE OF BIRTH:  05/09/1936  SUBJECTIVE:  patient came in with increasing weakness. Which is progressively busy getting worse. She has fatigue ability. Difficulty swallowing difficulty talking and slow response due to worsening progression of Parkinson's disease  Son in the room gives most history.  REVIEW OF SYSTEMS:   Review of Systems  Unable to perform ROS: Mental acuity   Tolerating Diet:some Tolerating PT: rehab  DRUG ALLERGIES:   Allergies  Allergen Reactions  . Codeine Other (See Comments)    crazy    VITALS:  Blood pressure 132/71, pulse 65, temperature 97.6 F (36.4 C), temperature source Oral, resp. rate 15, height 5\' 3"  (1.6 m), weight 68 kg, SpO2 97 %.  PHYSICAL EXAMINATION:   Physical Exam  GENERAL:  84 y.o.-year-old patient lying in the bed with no acute distress.  EYES: Pupils equal, round, reactive to light and accommodation. No scleral icterus.   HEENT: Head atraumatic, normocephalic. Oropharynx and nasopharynx clear.  NECK:  Supple, no jugular venous distention. No thyroid enlargement, no tenderness.  LUNGS: Normal breath sounds bilaterally, no wheezing, rales, rhonchi. No use of accessory muscles of respiration.  CARDIOVASCULAR: S1, S2 normal. No murmurs, rubs, or gallops.  ABDOMEN: Soft, nontender, nondistended. Bowel sounds present. No organomegaly or mass.  EXTREMITIES: No cyanosis, clubbing or edema b/l.    NEUROLOGIC: very limited. Moves all extremities. Deconditioned. Weak PSYCHIATRIC:  patient is alert and awake slow to respond SKIN:  Pressure Injury 11/10/19 Thigh Right;Left Stage 1 -  Intact skin with non-blanchable redness of a localized area usually over a bony prominence. small area of non-blanching redness to bilateral posterior thigh near buttocks (Active)  11/10/19 1158  Location: Thigh  Location Orientation: Right;Left  Staging:  Stage 1 -  Intact skin with non-blanchable redness of a localized area usually over a bony prominence.  Wound Description (Comments): small area of non-blanching redness to bilateral posterior thigh near buttocks  Present on Admission: Yes   LABORATORY PANEL:  CBC Recent Labs  Lab 11/11/19 0745  WBC 8.7  HGB 13.6  HCT 42.6  PLT 215    Chemistries  Recent Labs  Lab 11/11/19 0745  NA 135  K 3.2*  CL 97*  CO2 27  GLUCOSE 164*  BUN 18  CREATININE 0.60  CALCIUM 9.1  MG 1.8   Cardiac Enzymes No results for input(s): TROPONINI in the last 168 hours. RADIOLOGY:  DG Chest 2 View  Result Date: 11/09/2019 CLINICAL DATA:  Weakness EXAM: CHEST - 2 VIEW COMPARISON:  04/27/2012 FINDINGS: Cardiomegaly left lower lobe airspace opacity with probable left effusion. Right base atelectasis or infiltrate. Small right effusion suspected. No acute bony abnormality. IMPRESSION: Cardiomegaly. Moderate to large left effusion and small right effusion. Bibasilar airspace opacities, left greater than right could reflect atelectasis or infiltrates. Electronically Signed   By: 04/29/2012 M.D.   On: 11/09/2019 17:57   CT Head Wo Contrast  Result Date: 11/09/2019 CLINICAL DATA:  84 year old female with weakness. EXAM: CT HEAD WITHOUT CONTRAST TECHNIQUE: Contiguous axial images were obtained from the base of the skull through the vertex without intravenous contrast. COMPARISON:  Head CT dated 04/27/2012. FINDINGS: Brain: Moderate age-related atrophy and chronic microvascular ischemic changes. Old right occipital infarct. There is no acute intracranial hemorrhage. No mass effect or midline shift. No extra-axial fluid collection. Vascular: No hyperdense vessel or unexpected calcification. Skull: Normal. Negative  for fracture or focal lesion. Sinuses/Orbits: No acute finding. Other: None IMPRESSION: 1. No acute intracranial pathology. 2. Moderate age-related atrophy and chronic microvascular ischemic changes. Old  right occipital infarct. Electronically Signed   By: Anner Crete M.D.   On: 11/09/2019 19:32   CT Chest Wo Contrast  Result Date: 11/09/2019 CLINICAL DATA:  Hypertension. Weakness. Effusion versus pneumonia on radiograph EXAM: CT CHEST WITHOUT CONTRAST TECHNIQUE: Multidetector CT imaging of the chest was performed following the standard protocol without IV contrast. COMPARISON:  Radiograph earlier this day FINDINGS: Cardiovascular: Aortic atherosclerosis and tortuosity. Multi chamber cardiomegaly mitral annulus and coronary artery calcifications. Minimal pericardial fluid without significant effusion. Mediastinum/Nodes: Large hiatal hernia with greater than 50% of the stomach intrathoracic. Mediastinal lipomatosis adjacent to the left heart border. No esophageal wall thickening. No enlarged mediastinal lymph nodes. No visualized thyroid nodule. Lungs/Pleura: Compressive atelectasis in the left lower lobe adjacent to hiatal hernia. Dependent atelectasis in both lower lobes. Chronic volume loss in the medial right middle lobe. No evidence of pneumonia. No significant pleural effusion. No evidence of pulmonary edema. Upper Abdomen: No acute findings. Musculoskeletal: Exaggerated thoracic kyphosis. Mild degenerative change in the thoracic spine. There are no acute or suspicious osseous abnormalities. Chronic degenerative change of both shoulders, right greater than left. IMPRESSION: 1. No significant pleural effusion.  No evidence of pneumonia. 2. Left lung base opacity on radiograph is secondary to large hiatal hernia and prominent epicardial fat pad. 3. Multi chamber cardiomegaly. Coronary artery calcifications. 4. Multifocal atelectasis. Aortic Atherosclerosis (ICD10-I70.0). Electronically Signed   By: Keith Rake M.D.   On: 11/09/2019 19:41   DG Chest Portable 1 View  Result Date: 11/10/2019 CLINICAL DATA:  Increased oxygen requirement, hypoxia, hypertension, Parkinson's EXAM: PORTABLE CHEST 1 VIEW  COMPARISON:  Portable exam 0746 hours compared to 11/09/2019 FINDINGS: Enlargement of cardiac silhouette. Atherosclerotic calcification aorta. Mildly decreased LEFT pleural effusion. Persistent bibasilar atelectasis. Progressive perihilar opacities question pulmonary edema. No pneumothorax or acute osseous findings. IMPRESSION: Increased perihilar opacities question pulmonary edema. Decreased LEFT pleural effusion with persistent bibasilar atelectasis. Electronically Signed   By: Lavonia Dana M.D.   On: 11/10/2019 08:14   ECHOCARDIOGRAM COMPLETE  Result Date: 11/10/2019    ECHOCARDIOGRAM REPORT   Patient Name:   KEVYN WENGERT Date of Exam: 11/10/2019 Medical Rec #:  [id number]     Height:       63.0 in Accession #:    [id number]    Weight:       150.0 lb Date of Birth:  Jul 20, 1936     BSA:          31.711 m Patient Age:    63 years      BP:           104/59 mmHg Patient Gender: F             HR:           59 bpm. Exam Location:  [facility] Procedure: 2D Echo, Color Doppler and Cardiac Doppler Indications:     I50.31 CHF-Acute Diastolic  History:         Patient has prior history of Echocardiogram examinations. Risk                  Factors:Hypertension and Dyslipidemia.  Sonographer:     Charmayne Sheer RDCS (AE) Referring Phys:  Baker Janus Soledad Gerlach NIU Diagnosing Phys: Ida Rogue MD  Sonographer Comments: Technically difficult study due to poor echo windows, suboptimal parasternal window and no subcostal  window. Image acquisition challenging due to respiratory motion and Image acquisition challenging due to patient body habitus. IMPRESSIONS  1. Left ventricular ejection fraction, by estimation, is 55 to 60%. The left ventricle has normal function. The left ventricle has no regional wall motion abnormalities. There is mild left ventricular hypertrophy. Left ventricular diastolic parameters are consistent with Grade I diastolic dysfunction (impaired relaxation).  2. Right ventricular systolic function is normal. The right ventricular  size is normal. Tricuspid regurgitation signal is inadequate for assessing PA pressure.  3. Left atrial size was moderately dilated. FINDINGS  Left Ventricle: Left ventricular ejection fraction, by estimation, is 55 to 60%. The left ventricle has normal function. The left ventricle has no regional wall motion abnormalities. The left ventricular internal cavity size was normal in size. There is  mild left ventricular hypertrophy. Left ventricular diastolic parameters are consistent with Grade I diastolic dysfunction (impaired relaxation). Right Ventricle: The right ventricular size is normal. No increase in right ventricular wall thickness. Right ventricular systolic function is normal. Tricuspid regurgitation signal is inadequate for assessing PA pressure. Left Atrium: Left atrial size was moderately dilated. Right Atrium: Right atrial size was normal in size. Pericardium: There is no evidence of pericardial effusion. Mitral Valve: The mitral valve is normal in structure. Normal mobility of the mitral valve leaflets. No evidence of mitral valve regurgitation. No evidence of mitral valve stenosis. MV peak gradient, 4.8 mmHg. The mean mitral valve gradient is 2.0 mmHg. Tricuspid Valve: The tricuspid valve is not well visualized. Tricuspid valve regurgitation is not demonstrated. No evidence of tricuspid stenosis. Aortic Valve: The aortic valve was not well visualized. Aortic valve regurgitation is not visualized. No aortic stenosis is present. Aortic valve mean gradient measures 2.0 mmHg. Aortic valve peak gradient measures 4.0 mmHg. Aortic valve area, by VTI measures 1.51 cm. Pulmonic Valve: The pulmonic valve was not well visualized. Pulmonic valve regurgitation is mild. No evidence of pulmonic stenosis. Aorta: The aortic root was not well visualized and the ascending aorta was not well visualized. Venous: The pulmonary veins were not well visualized. The inferior vena cava is normal in size with greater than 50%  respiratory variability, suggesting right atrial pressure of 3 mmHg. IAS/Shunts: No atrial level shunt detected by color flow Doppler.  LEFT VENTRICLE PLAX 2D LVIDd:         4.70 cm     Diastology LVIDs:         3.67 cm     LV e' lateral:   3.48 cm/s LV PW:         1.22 cm     LV E/e' lateral: 27.0 LV IVS:        1.25 cm     LV e' medial:    3.70 cm/s LVOT diam:     2.00 cm     LV E/e' medial:  25.4 LV SV:         36 LV SV Index:   21 LVOT Area:     3.14 cm  LV Volumes (MOD) LV vol d, MOD A4C: 72.9 ml LV vol s, MOD A4C: 20.5 ml LV SV MOD A4C:     72.9 ml LEFT ATRIUM         Index LA diam:    4.90 cm 2.86 cm/m  AORTIC VALVE                   PULMONIC VALVE AV Area (Vmax):    1.77 cm    PV Vmax:  0.98 m/s AV Area (Vmean):   1.70 cm    PV Vmean:      69.900 cm/s AV Area (VTI):     1.51 cm    PV VTI:        0.240 m AV Vmax:           100.00 cm/s PV Peak grad:  3.8 mmHg AV Vmean:          73.900 cm/s PV Mean grad:  2.0 mmHg AV VTI:            0.241 m AV Peak Grad:      4.0 mmHg AV Mean Grad:      2.0 mmHg LVOT Vmax:         56.20 cm/s LVOT Vmean:        40.000 cm/s LVOT VTI:          0.116 m LVOT/AV VTI ratio: 0.48  AORTA Ao Root diam: 3.20 cm MITRAL VALVE MV Area (PHT): 3.21 cm     SHUNTS MV Peak grad:  4.8 mmHg     Systemic VTI:  0.12 m MV Mean grad:  2.0 mmHg     Systemic Diam: 2.00 cm MV Vmax:       1.10 m/s MV Vmean:      64.9 cm/s MV Decel Time: 236 msec MV E velocity: 94.10 cm/s MV A velocity: 109.00 cm/s MV E/A ratio:  0.86 Julien Nordmann MD Electronically signed by Julien Nordmann MD Signature Date/Time: 11/10/2019/7:40:44 PM    Final    ASSESSMENT AND PLAN:  Jennifer Snyder is a 84 y.o. female with medical history significant of hypertension, hyperlipidemia, GERD, peptic ulcer disease, Parkinson's disease, wheelchair-bound, who presents with generalized weakness. Per her son, patient has been feeling weak recently, which has been progressively worsening.  She has fatigue, difficulty swallowing,  difficulty talking, slow response  Hypertensive urgency and hx of essential hypertension: Bp 238/ 105 -->182/ 86.  -As needed IV hydralazine -Continue metoprolol -Patient is on IV Lasix-bp much improved--132/70  Acute respiratory failure with hypoxia due to possible acute CHF, diastolic (congestive heart failure): pt has 2+ bilateral lower leg edema, positive JVD, elevated BNP 161 -->234 - chest x-ray with possible pulmonary edema, clinically consistent with acute CHF. - Given long history of hypertension, patient may have diastolic CHF.   - SLP is done --> Diet DYS 2  -clinically does not seem to be like pneumonia. Will discontinue antibiotics continue to monitor. CT chest does not show evidence of pneumonia. -Start Lasix 20 mg daily  Parkinson disease (HCC): -continue Sinemet  Possible UTI (urinary tract infection): -received one dose of 3 g of fosfomycin in ED -Follow-up urine culture  GERD (gastroesophageal reflux disease): -Protonix  Generalized weakness: Likely due to multifactorial etiology, including possible UTI, acute CHF, hypertensive urgency.  CT head is negative for acute intracranial abnormalities. -pt/OT-- recommends rehab -discussed at length with patient's son. Patient used to have 24 seven caregivers at home. However given progressive weakness and requiring more assistance at home family wants to pursue rehab to see if patient improves given progression of Parkinson's.  Pressure injury Pressure Injury 11/10/19 Thigh Right;Left Stage 1 -  Intact skin with non-blanchable redness of a localized area usually over a bony prominence. small area of non-blanching redness to bilateral posterior thigh near buttocks (Active)  11/10/19 1158  Location: Thigh  Location Orientation: Right;Left  Staging: Stage 1 -  Intact skin with non-blanchable redness of a localized area usually over a  bony prominence.  Wound Description (Comments): small area of non-blanching redness to  bilateral posterior thigh near buttocks  Present on Admission: Yes       Palliative care consultation  Procedures:none Family communication :son at bedside Consults :Pallaitve care Discharge Disposition : patient is from home. Patient has bed at [facility]. Awaiting insurance authorization. CODE STATUS: DNR--prior to arrival DVT Prophylaxis :SCD   TOTAL TIME TAKING CARE OF THIS PATIENT: *30 minutes.  >50% time spent on counselling and coordination of care  Note: This dictation was prepared with Dragon dictation along with smaller phrase technology. Any transcriptional errors that result from this process are unintentional.  Enedina Finner M.D    [facility]   CC: Primary care physician; Lyndon Code, MDPatient ID: Jennifer Snyder, female   DOB: Aug 10, 1935, 84 y.o.   MRN: [id number]

## 2019-11-12 MED ORDER — POTASSIUM CHLORIDE 20 MEQ PO PACK
40.0000 meq | PACK | Freq: Once | ORAL | Status: AC
  Administered 2019-11-12: 10:00:00 40 meq via ORAL
  Filled 2019-11-12: qty 2

## 2019-11-12 MED ORDER — FUROSEMIDE 20 MG PO TABS: 20.0000 mg | ORAL_TABLET | ORAL | Status: DC

## 2019-11-12 MED ORDER — TRAMADOL HCL 50 MG PO TABS: 50.0000 mg | ORAL_TABLET | Freq: Four times a day (QID) | ORAL | 0 refills | Status: DC | PRN

## 2019-11-12 NOTE — Progress Notes (Signed)
Gave report to nurse, Marylene Land receiving patient at [facility]. Nurse verbalized understanding and has no questions at this time. Patient will be transported by EMS. Patients daughter is aware.

## 2019-11-12 NOTE — Discharge Instructions (Signed)
Mech Soft diet w/ MINCED meats, gravies and condiments added to moisten foods; Thin liquids VIA CUP - NO Straws. General aspiration and REFLUX precautions d/t Hiatal Hernia, Parkinson's Dis., and Presbyesophagus. Support at meals w/ feeding. Pills in Puree - crushed as needed for safer swallowing  Speech therapy to follow at the facility

## 2019-11-12 NOTE — Discharge Summary (Signed)
[facility] [place] [facility]   PATIENT NAME: Jennifer Snyder    MR#:  [id number]  DATE OF BIRTH:  09-19-1935  DATE OF ADMISSION:  11/09/2019 ADMITTING PHYSICIAN: Lorretta HarpXilin Niu, MD  DATE OF DISCHARGE: 11/12/2019  PRIMARY CARE PHYSICIAN: Lyndon CodeKhan, Fozia M, MD    ADMISSION DIAGNOSIS:  Parkinson disease (HCC) [G20] Acute pulmonary edema (HCC) [J81.0] Muscular deconditioning [R29.898] Hypertensive urgency [I16.0] Acute respiratory failure with hypoxia (HCC) [J96.01] Hypertension, unspecified type [I10]  DISCHARGE DIAGNOSIS:   Acute hypoxic respiratory failure due to CHF mild diastolic hypertensive urgency improved generalized weakness with deconditioning who secondary to progressive worsening Parkinson's SECONDARY DIAGNOSIS:   Past Medical History:  Diagnosis Date  . Hyperlipidemia   . Hypertension   . Osteoarthritis   . Parkinson's disease (HCC)   . Peptic ulcer disease     HOSPITAL COURSE:   Jennifer LawmanDoris R Rileyis a 84 y.o.femalewith medical history significant ofhypertension, hyperlipidemia, GERD, peptic ulcer disease, Parkinson's disease, wheelchair-bound, who presents with generalized weakness. Per her son, patient has been feeling weak recently, which has been progressively worsening. She has fatigue,difficulty swallowing,difficulty talking, slow response  Hypertensive urgencyand hx of essential hypertension:Bp 238/ 105 -->182/ 86.  -As needed IV hydralazine -Continue metoprolol -Patient is on IV Lasix-bp much improved--132/70  Acute respiratory failure with hypoxiadue to possible acute CHF, diastolic (congestive heart failure):pt has2+ bilateral lower leg edema, positive JVD, elevated BNP 161 -->234 - chest x-ray with possible pulmonary edema, clinically consistent with acute CHF. -Given long history of hypertension, patient may have diastolic CHF.  - SLP is done -->Diet DYS 2  -clinically does not seem to be like pneumonia. Will discontinue  antibiotics continue to monitor. CT chest does not show evidence of pneumonia. -change to oral Lasix 20 mg daily  Parkinson disease (HCC): -continueSinemet  PossibleUTI (urinary tract infection): -received one dose of3 g of fosfomycin in ED -Follow-up urine culture--multiple species 30K--afebrile. Will hold abxs  GERD (gastroesophageal reflux disease): -Protonix  Generalized weakness:Likely due to multifactorial etiology including acute CHF, hypertensive urgency.CT head is negative for acute intracranial abnormalities. -pt/OT-- recommends rehab -discussed at length with patient's son. Patient used to have 24 seven caregivers at home. However given progressive weakness and requiring more assistance at home family wants to pursue rehab to see if patient improves given progression of Parkinson's.  Pressure injury Pressure Injury 11/10/19 Thigh Right;Left Stage 1 -  Intact skin with non-blanchable redness of a localized area usually over a bony prominence. small area of non-blanching redness to bilateral posterior thigh near buttocks (Active)  11/10/19 1158  Location: Thigh  Location Orientation: Right;Left  Staging: Stage 1 -  Intact skin with non-blanchable redness of a localized area usually over a bony prominence.  Wound Description (Comments): small area of non-blanching redness to bilateral posterior thigh near buttocks  Present on Admission: Yes       Palliative care consultation as out pt at rehab  Procedures:none Family communication :son at bedside Consults :Pallaitve care Discharge Disposition : patient is from home. Patient has bed at [facility]. Per TOC received insurance authorization. CODE STATUS: DNR--prior to arrival DVT Prophylaxis :SCD  D/c today CONSULTS OBTAINED:    DRUG ALLERGIES:   Allergies  Allergen Reactions  . Codeine Other (See Comments)    crazy    DISCHARGE MEDICATIONS:   Allergies as of 11/12/2019      Reactions    Codeine Other (See Comments)   crazy      Medication List  TAKE these medications   aspirin 81 MG chewable tablet Chew by mouth.   Carbidopa-Levodopa ER 25-100 MG tablet controlled release Commonly known as: SINEMET CR Take 1 tablet by mouth at bedtime.   Sinemet 25-100 MG tablet Generic drug: carbidopa-levodopa Take 1 tablet by mouth 2 (two) times daily.   clorazepate 3.75 MG tablet Commonly known as: TRANXENE TAKE 1 TABLET BY MOUTH 1 TO 3 TIMES A DAY   furosemide 20 MG tablet Commonly known as: LASIX Take 1 tablet (20 mg total) by mouth every other day.   methocarbamol 750 MG tablet Commonly known as: ROBAXIN Take 1 tablet (750 mg total) by mouth every 6 (six) hours as needed for muscle spasms.   metoprolol succinate 25 MG 24 hr tablet Commonly known as: TOPROL-XL START WITH HALF TAB A DAY AND MAY INCREASE TO ONE TAB DAILY   oxybutynin 5 MG tablet Commonly known as: DITROPAN TAKE 1 TABLET BY MOUTH EVERY DAY   pantoprazole 40 MG tablet Commonly known as: PROTONIX TAKE 1 TABLET BY MOUTH DAILY FOR GERD   polyethylene glycol 17 g packet Commonly known as: MIRALAX / GLYCOLAX Take 17 g by mouth daily as needed for mild constipation.   rOPINIRole 0.25 MG tablet Commonly known as: REQUIP ONE TO 3 TABS A DAY AS DIRECTED What changed: See the new instructions.   traMADol 50 MG tablet Commonly known as: ULTRAM Take 1 tablet (50 mg total) by mouth every 6 (six) hours as needed. for pain   VITAMIN D3 PO Take by mouth daily.       If you experience worsening of your admission symptoms, develop shortness of breath, life threatening emergency, suicidal or homicidal thoughts you must seek medical attention immediately by calling 911 or calling your MD immediately  if symptoms less severe.  You Must read complete instructions/literature along with all the possible adverse reactions/side effects for all the Medicines you take and that have been prescribed to you. Take  any new Medicines after you have completely understood and accept all the possible adverse reactions/side effects.   Please note  You were cared for by a hospitalist during your hospital stay. If you have any questions about your discharge medications or the care you received while you were in the hospital after you are discharged, you can call the unit and asked to speak with the hospitalist on call if the hospitalist that took care of you is not available. Once you are discharged, your primary care physician will handle any further medical issues. Please note that NO REFILLS for any discharge medications will be authorized once you are discharged, as it is imperative that you return to your primary care physician (or establish a relationship with a primary care physician if you do not have one) for your aftercare needs so that they can reassess your need for medications and monitor your lab values. Today   SUBJECTIVE   C/o hip pain  VITAL SIGNS:  Blood pressure (!) 145/90, pulse 68, temperature 98 F (36.7 C), resp. rate 15, height 5\' 3"  (1.6 m), weight 68 kg, SpO2 98 %.  I/O:    Intake/Output Summary (Last 24 hours) at 11/12/2019 1207 Last data filed at 11/12/2019 1010 Gross per 24 hour  Intake 364.47 ml  Output 150 ml  Net 214.47 ml    PHYSICAL EXAMINATION:  GENERAL:  84 y.o.-year-old patient lying in the bed with no acute distress.  EYES: Pupils equal, round, reactive to light and accommodation. No scleral icterus.  HEENT: Head atraumatic, normocephalic. Oropharynx and nasopharynx clear.  NECK:  Supple, no jugular venous distention. No thyroid enlargement, no tenderness.  LUNGS: decreasedbreath sounds bilaterally, no wheezing, rales,rhonchi or crepitation. No use of accessory muscles of respiration.  CARDIOVASCULAR: S1, S2 normal. No murmurs, rubs, or gallops.  ABDOMEN: Soft, non-tender, non-distended. Bowel sounds present. No organomegaly or mass.  EXTREMITIES: No pedal edema,  cyanosis, or clubbing.  NEUROLOGIC: Cranial nerves II through XII are intact. Muscle strength 5/5 in all extremities. Sensation intact. Gait not checked. Weak, deconditioned PSYCHIATRIC: The patient is alert and oriented x 3.  SKIN: No obvious rash, lesion, or ulcer.   DATA REVIEW:   CBC  Recent Labs  Lab 11/11/19 0745  WBC 8.7  HGB 13.6  HCT 42.6  PLT 215    Chemistries  Recent Labs  Lab 11/11/19 0745  NA 135  K 3.2*  CL 97*  CO2 27  GLUCOSE 164*  BUN 18  CREATININE 0.60  CALCIUM 9.1  MG 1.8    Microbiology Results   Recent Results (from the past 240 hour(s))  SARS CORONAVIRUS 2 (TAT 6-24 HRS) Nasopharyngeal Nasopharyngeal Swab     Status: None   Collection Time: 11/09/19  4:49 PM   Specimen: Nasopharyngeal Swab  Result Value Ref Range Status   SARS Coronavirus 2 NEGATIVE NEGATIVE Final    Comment: (NOTE) SARS-CoV-2 target nucleic acids are NOT DETECTED. The SARS-CoV-2 RNA is generally detectable in upper and lower respiratory specimens during the acute phase of infection. Negative results do not preclude SARS-CoV-2 infection, do not rule out co-infections with other pathogens, and should not be used as the sole basis for treatment or other patient management decisions. Negative results must be combined with clinical observations, patient history, and epidemiological information. The expected result is Negative. Fact Sheet for Patients: HairSlick.no Fact Sheet for Healthcare Providers: quierodirigir.com This test is not yet approved or cleared by the [place] FDA and  has been authorized for detection and/or diagnosis of SARS-CoV-2 by FDA under an Emergency Use Authorization (EUA). This EUA will remain  in effect (meaning this test can be used) for the duration of the COVID-19 declaration under Section 56 4(b)(1) of the Act, 21 U.S.C. section 360bbb-3(b)(1), unless the authorization is terminated  or revoked sooner. Performed at [facility], [address], [place], [place] [address]   Urine Culture     Status: Abnormal   Collection Time: 11/09/19  4:49 PM   Specimen: Urine, Clean Catch  Result Value Ref Range Status   Specimen Description   Final    URINE, CLEAN CATCH Performed at [facility], [address], [place], [place] [address]    Special Requests   Final    Normal Performed at [facility], [address] [place], [place] [address]    Culture (A)  Final    30,000 COLONIES/mL MULTIPLE SPECIES PRESENT, SUGGEST RECOLLECTION   Report Status 11/11/2019 FINAL  Final    RADIOLOGY:  ECHOCARDIOGRAM COMPLETE  Result Date: 11/10/2019    ECHOCARDIOGRAM REPORT   Patient Name:   Jennifer Snyder Date of Exam: 11/10/2019 Medical Rec #:  [id number]     Height:       63.0 in Accession #:    [id number]    Weight:       150.0 lb Date of Birth:  1936-04-09     BSA:          1.711 m Patient Age:    77  years      BP:           104/59 mmHg Patient Gender: F             HR:           59 bpm. Exam Location:  [facility] Procedure: 2D Echo, Color Doppler and Cardiac Doppler Indications:     I50.31 CHF-Acute Diastolic  History:         Patient has prior history of Echocardiogram examinations. Risk                  Factors:Hypertension and Dyslipidemia.  Sonographer:     Humphrey Rolls RDCS (AE) Referring Phys:  Kern Reap Brien Few NIU Diagnosing Phys: Julien Nordmann MD  Sonographer Comments: Technically difficult study due to poor echo windows, suboptimal parasternal window and no subcostal window. Image acquisition challenging due to respiratory motion and Image acquisition challenging due to patient body habitus. IMPRESSIONS  1. Left ventricular ejection fraction, by estimation, is 55 to 60%. The left ventricle has normal function. The left ventricle has no regional wall motion abnormalities. There is mild left ventricular hypertrophy. Left ventricular diastolic parameters are consistent with  Grade I diastolic dysfunction (impaired relaxation).  2. Right ventricular systolic function is normal. The right ventricular size is normal. Tricuspid regurgitation signal is inadequate for assessing PA pressure.  3. Left atrial size was moderately dilated. FINDINGS  Left Ventricle: Left ventricular ejection fraction, by estimation, is 55 to 60%. The left ventricle has normal function. The left ventricle has no regional wall motion abnormalities. The left ventricular internal cavity size was normal in size. There is  mild left ventricular hypertrophy. Left ventricular diastolic parameters are consistent with Grade I diastolic dysfunction (impaired relaxation). Right Ventricle: The right ventricular size is normal. No increase in right ventricular wall thickness. Right ventricular systolic function is normal. Tricuspid regurgitation signal is inadequate for assessing PA pressure. Left Atrium: Left atrial size was moderately dilated. Right Atrium: Right atrial size was normal in size. Pericardium: There is no evidence of pericardial effusion. Mitral Valve: The mitral valve is normal in structure. Normal mobility of the mitral valve leaflets. No evidence of mitral valve regurgitation. No evidence of mitral valve stenosis. MV peak gradient, 4.8 mmHg. The mean mitral valve gradient is 2.0 mmHg. Tricuspid Valve: The tricuspid valve is not well visualized. Tricuspid valve regurgitation is not demonstrated. No evidence of tricuspid stenosis. Aortic Valve: The aortic valve was not well visualized. Aortic valve regurgitation is not visualized. No aortic stenosis is present. Aortic valve mean gradient measures 2.0 mmHg. Aortic valve peak gradient measures 4.0 mmHg. Aortic valve area, by VTI measures 1.51 cm. Pulmonic Valve: The pulmonic valve was not well visualized. Pulmonic valve regurgitation is mild. No evidence of pulmonic stenosis. Aorta: The aortic root was not well visualized and the ascending aorta was not well  visualized. Venous: The pulmonary veins were not well visualized. The inferior vena cava is normal in size with greater than 50% respiratory variability, suggesting right atrial pressure of 3 mmHg. IAS/Shunts: No atrial level shunt detected by color flow Doppler.  LEFT VENTRICLE PLAX 2D LVIDd:         4.70 cm     Diastology LVIDs:         3.67 cm     LV e' lateral:   3.48 cm/s LV PW:         1.22 cm     LV E/e' lateral: 27.0 LV IVS:  1.25 cm     LV e' medial:    3.70 cm/s LVOT diam:     2.00 cm     LV E/e' medial:  25.4 LV SV:         36 LV SV Index:   21 LVOT Area:     3.14 cm  LV Volumes (MOD) LV vol d, MOD A4C: 72.9 ml LV vol s, MOD A4C: 20.5 ml LV SV MOD A4C:     72.9 ml LEFT ATRIUM         Index LA diam:    4.90 cm 2.86 cm/m  AORTIC VALVE                   PULMONIC VALVE AV Area (Vmax):    1.77 cm    PV Vmax:       0.98 m/s AV Area (Vmean):   1.70 cm    PV Vmean:      69.900 cm/s AV Area (VTI):     1.51 cm    PV VTI:        0.240 m AV Vmax:           100.00 cm/s PV Peak grad:  3.8 mmHg AV Vmean:          73.900 cm/s PV Mean grad:  2.0 mmHg AV VTI:            0.241 m AV Peak Grad:      4.0 mmHg AV Mean Grad:      2.0 mmHg LVOT Vmax:         56.20 cm/s LVOT Vmean:        40.000 cm/s LVOT VTI:          0.116 m LVOT/AV VTI ratio: 0.48  AORTA Ao Root diam: 3.20 cm MITRAL VALVE MV Area (PHT): 3.21 cm     SHUNTS MV Peak grad:  4.8 mmHg     Systemic VTI:  0.12 m MV Mean grad:  2.0 mmHg     Systemic Diam: 2.00 cm MV Vmax:       1.10 m/s MV Vmean:      64.9 cm/s MV Decel Time: 236 msec MV E velocity: 94.10 cm/s MV A velocity: 109.00 cm/s MV E/A ratio:  0.86 Julien Nordmann MD Electronically signed by Julien Nordmann MD Signature Date/Time: 11/10/2019/7:40:44 PM    Final      CODE STATUS:     Code Status Orders  (From admission, onward)         Start     Ordered   11/10/19 1112  Do not attempt resuscitation (DNR)  Continuous    Question Answer Comment  In the event of cardiac or respiratory ARREST  Do not call a "code blue"   In the event of cardiac or respiratory ARREST Do not perform Intubation, CPR, defibrillation or ACLS   In the event of cardiac or respiratory ARREST Use medication by any route, position, wound care, and other measures to relive pain and suffering. May use oxygen, suction and manual treatment of airway obstruction as needed for comfort.      11/10/19 1111        Code Status History    This patient has a current code status but no historical code status.   Advance Care Planning Activity    Advance Directive Documentation     Most Recent Value  Type of Advance Directive  Living will  Pre-existing out of facility DNR order (yellow form  or pink MOST form)  --  "MOST" Form in Place?  --       TOTAL TIME TAKING CARE OF THIS PATIENT: *40* minutes.    Fritzi Mandes M.D  [facility]    CC: Primary care physician; Lavera Guise, MD

## 2019-11-12 NOTE — TOC Transition Note (Signed)
Transition of Care [facility]) - CM/SW Discharge Note   Patient Details  Name: Jennifer Snyder MRN: [id number] Date of Birth: 08/02/1936  Transition of [facility] CM/SW Contact:  Maree Krabbe, LCSW Phone Number: 11/12/2019, 11:56 AM   Clinical Narrative:  Clinical Social Worker facilitated patient discharge including contacting patient family and facility to confirm patient discharge plans.  Clinical information faxed to facility and family agreeable with plan.  CSW arranged ambulance transport via [facility] to [facility]  .  RN to call [phone] for report prior to discharge.   Final next level of care: Skilled Nursing Facility Barriers to Discharge: No Barriers Identified   Patient Goals and CMS Choice Patient states their goals for this hospitalization and ongoing recovery are:: Return home after rehab in SNF CMS Medicare.gov Compare Post Acute Care list provided to:: Patient Represenative (must comment)(Sharon Tarman (daughter) PH: [phone]) Choice offered to / list presented to : Adult Children  Discharge Placement              Patient [facility] bed at: [facility] Patient to be transferred to facility by: [facility] Name of family member notified: Arlys John Patient and family notified of of transfer: 11/12/19  Discharge Plan and Services     Post Acute Care Choice: Skilled Nursing Facility                               Social Determinants of Health (SDOH) Interventions     Readmission Risk Interventions No flowsheet data found.

## 2019-11-14 ENCOUNTER — Telehealth: Payer: Self-pay | Admitting: Family

## 2019-11-14 DIAGNOSIS — I5032 Chronic diastolic (congestive) heart failure: Secondary | ICD-10-CM | POA: Insufficient documentation

## 2019-11-14 NOTE — Telephone Encounter (Signed)
Spoke with [facility] and patients legal Guardian who confirmed patients new patient CHF Clinic appointment for 4/20.     Deetta Perla, [place]

## 2019-11-18 ENCOUNTER — Other Ambulatory Visit: Payer: Self-pay

## 2019-11-18 ENCOUNTER — Other Ambulatory Visit: Payer: Self-pay | Admitting: Internal Medicine

## 2019-11-18 MED ORDER — OXYBUTYNIN CHLORIDE 5 MG PO TABS: 5.0000 mg | ORAL_TABLET | Freq: Every day | ORAL | 3 refills | Status: DC

## 2019-11-22 ENCOUNTER — Ambulatory Visit: Payer: Medicare Other | Admitting: Family

## 2019-12-13 ENCOUNTER — Ambulatory Visit: Payer: Medicare Other | Admitting: Internal Medicine

## 2019-12-15 ENCOUNTER — Telehealth: Payer: Self-pay

## 2019-12-15 NOTE — Telephone Encounter (Signed)
Confirmed and screened for 12-20-19 ov. 

## 2019-12-20 ENCOUNTER — Encounter: Payer: Self-pay | Admitting: Internal Medicine

## 2019-12-20 ENCOUNTER — Ambulatory Visit: Payer: Medicare Other | Admitting: Internal Medicine

## 2019-12-20 ENCOUNTER — Other Ambulatory Visit: Payer: Self-pay

## 2019-12-20 VITALS — BP 140/80 | HR 94 | Temp 97.9°F | Resp 16 | Ht 61.0 in | Wt 150.0 lb

## 2019-12-20 DIAGNOSIS — R7301 Impaired fasting glucose: Secondary | ICD-10-CM

## 2019-12-20 DIAGNOSIS — G20 Parkinson's disease: Secondary | ICD-10-CM | POA: Diagnosis not present

## 2019-12-20 DIAGNOSIS — E538 Deficiency of other specified B group vitamins: Secondary | ICD-10-CM

## 2019-12-20 DIAGNOSIS — I10 Essential (primary) hypertension: Secondary | ICD-10-CM

## 2019-12-20 DIAGNOSIS — M150 Primary generalized (osteo)arthritis: Secondary | ICD-10-CM

## 2019-12-20 LAB — POCT GLYCOSYLATED HEMOGLOBIN (HGB A1C): Hemoglobin A1C: 7 % — AB (ref 4.0–5.6)

## 2019-12-20 MED ORDER — CYANOCOBALAMIN 1000 MCG/ML IJ SOLN
1000.0000 ug | Freq: Once | INTRAMUSCULAR | Status: AC
  Administered 2019-12-20: 1000 ug via INTRAMUSCULAR

## 2019-12-20 NOTE — Progress Notes (Signed)
[facility] [address] [place], [place] [address]  Internal MEDICINE  Office Visit Note  Patient Name: Jennifer Snyder  [id number]  [id number]  Date of Service: 01/06/2020  Chief Complaint  Patient presents with  . Hyperlipidemia  . Hypertension  . Tremors    pt is in wheel chair    HPI  Pt is here for follow up.  She reports she recently spent a few weeks in rehab after a short hospital admission for muscular deconditioning. She has now been back home for a few weeks and has physical therapy coming in to work with her.  She has a good appetite, and is being followed by neurology for her parkinson's.  Her blood pressure is well controlled.  Denies Chest pain, Shortness of breath, palpitations, headache, or blurred vision.    Current Medication: Outpatient Encounter Medications as of 12/20/2019  Medication Sig  . aspirin 81 MG chewable tablet Chew by mouth.  . carbidopa-levodopa (SINEMET) 25-100 MG tablet Take 1 tablet by mouth 2 (two) times daily.   . Carbidopa-Levodopa ER (SINEMET CR) 25-100 MG tablet controlled release Take 1 tablet by mouth at bedtime.   . Cholecalciferol (VITAMIN D3 PO) Take by mouth daily.  . clorazepate (TRANXENE) 3.75 MG tablet TAKE 1 TABLET BY MOUTH 1 TO 3 TIMES A DAY  . furosemide (LASIX) 20 MG tablet Take 1 tablet (20 mg total) by mouth every other day.  . methocarbamol (ROBAXIN) 750 MG tablet Take 1 tablet (750 mg total) by mouth every 6 (six) hours as needed for muscle spasms.  . metoprolol succinate (TOPROL-XL) 25 MG 24 hr tablet START WITH HALF TAB A DAY AND MAY INCREASE TO ONE TAB DAILY  . oxybutynin (DITROPAN) 5 MG tablet Take 1 tablet (5 mg total) by mouth daily.  . pantoprazole (PROTONIX) 40 MG tablet TAKE 1 TABLET BY MOUTH DAILY FOR GERD  . polyethylene glycol (MIRALAX / GLYCOLAX) packet Take 17 g by mouth daily as needed for mild constipation.   . traMADol (ULTRAM) 50 MG tablet Take 1 tablet (50 mg total) by mouth every 6 (six) hours  as needed. for pain  . [DISCONTINUED] rOPINIRole (REQUIP) 0.25 MG tablet ONE TO 3 TABS A DAY AS DIRECTED (Patient not taking: No sig reported)  . [EXPIRED] cyanocobalamin ((VITAMIN B-12)) injection 1,000 mcg    No facility-administered encounter medications on file as of 12/20/2019.    Surgical History: Past Surgical History:  Procedure Laterality Date  . ABDOMINAL HERNIA REPAIR    . ADENOIDECTOMY  1994  . CATARACT EXTRACTION, BILATERAL  09/2014, 07/2014  . child birth  40, 44  . CHOLECYSTECTOMY  1989  . laser surgery on both eyes  11/2016  . left knee replacement  1993  . para esophogeal hernia repair    . TONSILLECTOMY  1944  . VAGINAL HYSTERECTOMY  1976    Medical History: Past Medical History:  Diagnosis Date  . Hyperlipidemia   . Hypertension   . Osteoarthritis   . Parkinson's disease (HCC)   . Peptic ulcer disease     Family History: Family History  Problem Relation Age of Onset  . Lung cancer Mother   . Hyperlipidemia Mother   . Hypertension Mother   . Osteoarthritis Mother   . Stroke Mother     Social History   Socioeconomic History  . Marital status: Single    Spouse name: Not on file  . Number of children: Not on file  . Years of education: Not  on file  . Highest education level: Not on file  Occupational History  . Not on file  Tobacco Use  . Smoking status: Never Smoker  . Smokeless tobacco: Never Used  Substance and Sexual Activity  . Alcohol use: No  . Drug use: No  . Sexual activity: Not on file  Other Topics Concern  . Not on file  Social History Narrative  . Not on file   Social Determinants of Health   Financial Resource Strain:   . Difficulty of Paying Living Expenses:   Food Insecurity:   . Worried About Charity fundraiser in the Last Year:   . Arboriculturist in the Last Year:   Transportation Needs:   . Film/video editor (Medical):   Marland Kitchen Lack of Transportation (Non-Medical):   Physical Activity:   . Days of  Exercise per Week:   . Minutes of Exercise per Session:   Stress:   . Feeling of Stress :   Social Connections:   . Frequency of Communication with Friends and Family:   . Frequency of Social Gatherings with Friends and Family:   . Attends Religious Services:   . Active Member of Clubs or Organizations:   . Attends Archivist Meetings:   Marland Kitchen Marital Status:   Intimate Partner Violence:   . Fear of Current or Ex-Partner:   . Emotionally Abused:   Marland Kitchen Physically Abused:   . Sexually Abused:       Review of Systems  Constitutional: Negative for chills, fatigue and unexpected weight change.  HENT: Negative for congestion, rhinorrhea, sneezing and sore throat.   Eyes: Negative for photophobia, pain and redness.  Respiratory: Negative for cough, chest tightness and shortness of breath.   Cardiovascular: Negative for chest pain and palpitations.  Gastrointestinal: Negative for abdominal pain, constipation, diarrhea, nausea and vomiting.  Endocrine: Negative.   Genitourinary: Negative for dysuria and frequency.  Musculoskeletal: Negative for arthralgias, back pain, joint swelling and neck pain.  Skin: Negative for rash.  Allergic/Immunologic: Negative.   Neurological: Negative for tremors and numbness.  Hematological: Negative for adenopathy. Does not bruise/bleed easily.  Psychiatric/Behavioral: Negative for behavioral problems and sleep disturbance. The patient is not nervous/anxious.     Vital Signs: BP 140/80   Pulse 94   Temp 97.9 F (36.6 C)   Resp 16   Ht 5\' 1"  (1.549 m)   Wt 150 lb (68 kg)   SpO2 95%   BMI 28.34 kg/m    Physical Exam Vitals and nursing note reviewed.  Constitutional:      General: She is not in acute distress.    Appearance: She is well-developed. She is not diaphoretic.  HENT:     Head: Normocephalic and atraumatic.     Mouth/Throat:     Pharynx: No oropharyngeal exudate.  Eyes:     Pupils: Pupils are equal, round, and reactive to  light.  Neck:     Thyroid: No thyromegaly.     Vascular: No JVD.     Trachea: No tracheal deviation.  Cardiovascular:     Rate and Rhythm: Normal rate and regular rhythm.     Heart sounds: Normal heart sounds. No murmur. No friction rub. No gallop.   Pulmonary:     Effort: Pulmonary effort is normal. No respiratory distress.     Breath sounds: Normal breath sounds. No wheezing or rales.  Chest:     Chest wall: No tenderness.  Abdominal:     Palpations:  Abdomen is soft.     Tenderness: There is no abdominal tenderness. There is no guarding.  Musculoskeletal:     Cervical back: Normal range of motion and neck supple.     Comments: Pt is in wheel chair  Lymphadenopathy:     Cervical: No cervical adenopathy.  Skin:    General: Skin is warm and dry.  Neurological:     Mental Status: She is alert and oriented to person, place, and time.     Cranial Nerves: No cranial nerve deficit.  Psychiatric:        Behavior: Behavior normal.        Thought Content: Thought content normal.        Judgment: Judgment normal.    Assessment/Plan: 1. Parkinson disease (HCC) - Declining overall. Pt continues to have rehab   2. B12 deficiency - cyanocobalamin ((VITAMIN B-12)) injection 1,000 mcg  3. Impaired fasting glucose - Continue to monitor  - POCT HgB A1C  4. Essential hypertension - Controlled   5. Primary generalized hypertrophic osteoarthrosis - Stable at this time    General Counseling: Josue verbalizes understanding of the findings of todays visit and agrees with plan of treatment. I have discussed any further diagnostic evaluation that may be needed or ordered today. We also reviewed her medications today. she has been encouraged to call the office with any questions or concerns that should arise related to todays visit.  Orders Placed This Encounter  Procedures  . POCT HgB A1C    Meds ordered this encounter  Medications  . cyanocobalamin ((VITAMIN B-12)) injection 1,000  mcg    Time spent: 30 Minutes  This patient was seen by Blima Ledger AGNP-C in Collaboration with Dr Lyndon Code as a part of collaborative care agreement    Johnna Acosta AGNP-C Internal medicine

## 2020-01-18 ENCOUNTER — Other Ambulatory Visit: Payer: Self-pay

## 2020-02-15 ENCOUNTER — Other Ambulatory Visit: Payer: Self-pay

## 2020-02-17 ENCOUNTER — Other Ambulatory Visit: Payer: Self-pay

## 2020-02-20 ENCOUNTER — Other Ambulatory Visit: Payer: Self-pay

## 2020-02-21 ENCOUNTER — Other Ambulatory Visit: Payer: Self-pay

## 2020-02-21 MED ORDER — GABAPENTIN 100 MG PO CAPS: ORAL_CAPSULE | ORAL | 2 refills | Status: DC

## 2020-03-06 ENCOUNTER — Other Ambulatory Visit: Payer: Self-pay

## 2020-03-06 MED ORDER — GABAPENTIN 100 MG PO CAPS: ORAL_CAPSULE | ORAL | 2 refills | Status: DC

## 2020-03-13 ENCOUNTER — Other Ambulatory Visit: Payer: Self-pay

## 2020-03-13 MED ORDER — METOPROLOL SUCCINATE ER 25 MG PO TB24: ORAL_TABLET | ORAL | 3 refills | Status: DC

## 2020-04-06 ENCOUNTER — Other Ambulatory Visit: Payer: Self-pay | Admitting: Internal Medicine

## 2020-04-17 ENCOUNTER — Other Ambulatory Visit: Payer: Self-pay

## 2020-04-30 ENCOUNTER — Other Ambulatory Visit: Payer: Self-pay

## 2020-04-30 MED ORDER — CLOTRIMAZOLE-BETAMETHASONE 1-0.05 % EX CREA: 1.0000 "application " | TOPICAL_CREAM | Freq: Two times a day (BID) | CUTANEOUS | 1 refills | Status: DC

## 2020-05-09 ENCOUNTER — Other Ambulatory Visit: Payer: Self-pay

## 2020-05-09 MED ORDER — OXYBUTYNIN CHLORIDE 5 MG PO TABS: 5.0000 mg | ORAL_TABLET | Freq: Every day | ORAL | 1 refills | Status: DC

## 2020-05-11 ENCOUNTER — Telehealth: Payer: Self-pay

## 2020-05-11 ENCOUNTER — Other Ambulatory Visit: Payer: Self-pay | Admitting: Nurse Practitioner

## 2020-05-11 DIAGNOSIS — M150 Primary generalized (osteo)arthritis: Secondary | ICD-10-CM

## 2020-05-11 MED ORDER — TRAMADOL HCL 50 MG PO TABS: 50.0000 mg | ORAL_TABLET | Freq: Three times a day (TID) | ORAL | 0 refills | Status: DC | PRN

## 2020-05-11 NOTE — Telephone Encounter (Signed)
Flled with #30 tablets and sent to [facility] in [place].

## 2020-05-21 ENCOUNTER — Other Ambulatory Visit: Payer: Self-pay

## 2020-05-21 MED ORDER — FUROSEMIDE 20 MG PO TABS: 20.0000 mg | ORAL_TABLET | ORAL | 1 refills | Status: DC

## 2020-06-18 ENCOUNTER — Other Ambulatory Visit: Payer: Self-pay

## 2020-06-18 MED ORDER — CLOTRIMAZOLE-BETAMETHASONE 1-0.05 % EX CREA: 1.0000 "application " | TOPICAL_CREAM | Freq: Two times a day (BID) | CUTANEOUS | 1 refills | Status: DC

## 2020-06-19 ENCOUNTER — Ambulatory Visit (INDEPENDENT_AMBULATORY_CARE_PROVIDER_SITE_OTHER): Payer: Medicare Other | Admitting: Nurse Practitioner

## 2020-06-19 ENCOUNTER — Encounter: Payer: Self-pay | Admitting: Nurse Practitioner

## 2020-06-19 ENCOUNTER — Other Ambulatory Visit: Payer: Self-pay

## 2020-06-19 VITALS — BP 140/80 | HR 60 | Temp 98.0°F | Resp 16 | Ht 61.0 in | Wt 150.0 lb

## 2020-06-19 DIAGNOSIS — F411 Generalized anxiety disorder: Secondary | ICD-10-CM

## 2020-06-19 DIAGNOSIS — R531 Weakness: Secondary | ICD-10-CM | POA: Diagnosis not present

## 2020-06-19 DIAGNOSIS — G20 Parkinson's disease: Secondary | ICD-10-CM | POA: Diagnosis not present

## 2020-06-19 DIAGNOSIS — E538 Deficiency of other specified B group vitamins: Secondary | ICD-10-CM

## 2020-06-19 DIAGNOSIS — I10 Essential (primary) hypertension: Secondary | ICD-10-CM | POA: Diagnosis not present

## 2020-06-19 DIAGNOSIS — Z0001 Encounter for general adult medical examination with abnormal findings: Secondary | ICD-10-CM

## 2020-06-19 DIAGNOSIS — R269 Unspecified abnormalities of gait and mobility: Secondary | ICD-10-CM | POA: Diagnosis not present

## 2020-06-19 DIAGNOSIS — M150 Primary generalized (osteo)arthritis: Secondary | ICD-10-CM

## 2020-06-19 MED ORDER — CYANOCOBALAMIN 1000 MCG/ML IJ SOLN
1000.0000 ug | Freq: Once | INTRAMUSCULAR | Status: AC
  Administered 2020-06-19: 1000 ug via INTRAMUSCULAR

## 2020-06-19 MED ORDER — CLORAZEPATE DIPOTASSIUM 3.75 MG PO TABS: ORAL_TABLET | ORAL | 2 refills | Status: DC

## 2020-06-19 MED ORDER — MIRTAZAPINE 7.5 MG PO TABS: 7.5000 mg | ORAL_TABLET | Freq: Every day | ORAL | 2 refills | Status: DC

## 2020-06-19 MED ORDER — TRAMADOL HCL 50 MG PO TABS: 50.0000 mg | ORAL_TABLET | Freq: Two times a day (BID) | ORAL | 2 refills | Status: DC | PRN

## 2020-06-19 NOTE — Progress Notes (Signed)
[facility] [address] [place], [place] [address]  Internal MEDICINE  Office Visit Note  Patient Name: Jennifer RiseDoris R Snyder  [id number]  [id number]  Date of Service: 06/20/2020   Pt is here for routine health maintenance examination  Chief Complaint  Patient presents with   Medicare Wellness    not moving around as good as she use too,needs an aid    Hyperlipidemia   Hypertension   Quality Metric Gaps    flu,tetnaus,   controlled substance form    reviewed with PT   Sleep Apnea    not sleeping well    Rectal Pain    rash on buttocks irritation when sitting     The patient is here for health maintenance exam. She states that she needs something to help her with sleep. States that she gets a lot on her mind and it is difficult for there to get her mid to settle and get to sleep.  She is also becoming weak. Having difficulty moving around or even wanting to move around. She does have Parkinson's disease. Family is wanting to see if physical therapist can come work with her a few times per week. She has had this in the past and really helped her to get around better. Had more energy, and was able to participate more in routine activities. The patient's caregiver has noted that patient has been having mood swings and some increased confusion. Feels like the parkinson's disease may contribute to this.     Current Medication: Outpatient Encounter Medications as of 06/19/2020  Medication Sig   aspirin 81 MG chewable tablet Chew by mouth.   carbidopa-levodopa (SINEMET) 25-100 MG tablet Take 1 tablet by mouth 2 (two) times daily.    Carbidopa-Levodopa ER (SINEMET CR) 25-100 MG tablet controlled release Take 1 tablet by mouth at bedtime.    Cholecalciferol (VITAMIN D3 PO) Take by mouth daily.   clorazepate (TRANXENE) 3.75 MG tablet Take 1 tablet po 1 to 2 times daily as needed for anxiety.   clotrimazole-betamethasone (LOTRISONE) cream Apply 1 application  topically 2 (two) times daily.   furosemide (LASIX) 20 MG tablet Take 1 tablet (20 mg total) by mouth every other day.   gabapentin (NEURONTIN) 100 MG capsule Take 1 capsule po twice daily as needed for leg and nerve pain   methocarbamol (ROBAXIN) 750 MG tablet Take 1 tablet (750 mg total) by mouth every 6 (six) hours as needed for muscle spasms.   metoprolol succinate (TOPROL-XL) 25 MG 24 hr tablet Start with half tablet a day and may increase to one tablet daily   oxybutynin (DITROPAN) 5 MG tablet Take 1 tablet (5 mg total) by mouth daily.   pantoprazole (PROTONIX) 40 MG tablet TAKE 1 TABLET BY MOUTH DAILY FOR GERD   polyethylene glycol (MIRALAX / GLYCOLAX) packet Take 17 g by mouth daily as needed for mild constipation.    traMADol (ULTRAM) 50 MG tablet Take 1 tablet (50 mg total) by mouth every 12 (twelve) hours as needed. for pain   [DISCONTINUED] clorazepate (TRANXENE) 3.75 MG tablet TAKE 1 TABLET BY MOUTH 1 TO 3 TIMES A DAY   [DISCONTINUED] traMADol (ULTRAM) 50 MG tablet Take 1 tablet (50 mg total) by mouth 3 (three) times daily as needed. for pain   mirtazapine (REMERON) 7.5 MG tablet Take 1 tablet (7.5 mg total) by mouth at bedtime.   [EXPIRED] cyanocobalamin ((VITAMIN B-12)) injection 1,000 mcg    No facility-administered encounter medications on file as  of 06/19/2020.    Surgical History: Past Surgical History:  Procedure Laterality Date   ABDOMINAL HERNIA REPAIR     ADENOIDECTOMY  1994   CATARACT EXTRACTION, BILATERAL  09/2014, 07/2014   child birth  4, 43   CHOLECYSTECTOMY  1989   laser surgery on both eyes  11/2016   left knee replacement  1993   para esophogeal hernia repair     TONSILLECTOMY  1944   VAGINAL HYSTERECTOMY  1976    Medical History: Past Medical History:  Diagnosis Date   Hyperlipidemia    Hypertension    Osteoarthritis    Parkinson's disease (HCC)    Peptic ulcer disease     Family History: Family History   Problem Relation Age of Onset   Lung cancer Mother    Hyperlipidemia Mother    Hypertension Mother    Osteoarthritis Mother    Stroke Mother       Review of Systems  Constitutional: Positive for activity change and fatigue. Negative for chills and unexpected weight change.       Movement getting more difficult for the patient  HENT: Negative for congestion, ear pain, rhinorrhea, sneezing and sore throat.   Respiratory: Negative for cough, chest tightness, shortness of breath and wheezing.   Cardiovascular: Negative for chest pain and palpitations.  Gastrointestinal: Negative for abdominal pain, constipation, diarrhea, nausea and vomiting.  Endocrine: Negative for cold intolerance, heat intolerance, polydipsia and polyuria.  Genitourinary: Negative for dysuria, frequency, hematuria and urgency.  Musculoskeletal: Positive for gait problem. Negative for arthralgias, back pain, joint swelling and neck pain.       Walking and doing normal activities of daily living getting more difficult.   Skin: Negative for rash.  Allergic/Immunologic: Negative for environmental allergies.  Neurological: Positive for dizziness and weakness. Negative for tremors and numbness.  Hematological: Negative for adenopathy. Does not bruise/bleed easily.  Psychiatric/Behavioral: Negative for behavioral problems and sleep disturbance. The patient is nervous/anxious.      Today's Vitals   06/19/20 1006  BP: 140/80  Pulse: 60  Resp: 16  Temp: 98 F (36.7 C)  SpO2: 95%  Weight: 150 lb (68 kg)  Height: 5\' 1"  (1.549 m)   Body mass index is 28.34 kg/m.  Physical Exam Vitals and nursing note reviewed.  Constitutional:      General: She is not in acute distress.    Appearance: Normal appearance. She is well-developed. She is not diaphoretic.  HENT:     Head: Normocephalic and atraumatic.     Nose: Nose normal.     Mouth/Throat:     Pharynx: No oropharyngeal exudate.  Eyes:     Extraocular  Movements: Extraocular movements intact.     Pupils: Pupils are equal, round, and reactive to light.  Neck:     Thyroid: No thyromegaly.     Vascular: No carotid bruit or JVD.     Trachea: No tracheal deviation.  Cardiovascular:     Rate and Rhythm: Normal rate and regular rhythm.     Pulses: Normal pulses.     Heart sounds: Normal heart sounds. No murmur heard.  No friction rub. No gallop.   Pulmonary:     Effort: Pulmonary effort is normal. No respiratory distress.     Breath sounds: Normal breath sounds. No wheezing.  Chest:     Chest wall: No tenderness.  Abdominal:     General: Bowel sounds are normal.     Palpations: Abdomen is soft.  Tenderness: There is no abdominal tenderness. There is no guarding.  Musculoskeletal:        General: Normal range of motion.     Cervical back: Normal range of motion and neck supple.     Comments: Moderate, generalized weakness. Currently in manual wheelchair with caregiver pushing her.   Lymphadenopathy:     Cervical: No cervical adenopathy.  Skin:    General: Skin is warm and dry.  Neurological:     Mental Status: She is alert and oriented to person, place, and time. Mental status is at baseline.     Cranial Nerves: No cranial nerve deficit.  Psychiatric:        Mood and Affect: Mood normal.        Behavior: Behavior normal.        Thought Content: Thought content normal.        Judgment: Judgment normal.    Depression screen [facility] 2/9 06/19/2020 12/20/2019 06/14/2019 05/26/2018 03/24/2018  Decreased Interest 0 0 0 0 0  Down, Depressed, Hopeless 0 0 0 0 0  PHQ - 2 Score 0 0 0 0 0    Functional Status Survey: Is the patient deaf or have difficulty hearing?: Yes Does the patient have difficulty seeing, even when wearing glasses/contacts?: Yes Does the patient have difficulty concentrating, remembering, or making decisions?: Yes Does the patient have difficulty walking or climbing stairs?: Yes Does the patient have difficulty  dressing or bathing?: Yes Does the patient have difficulty doing errands alone such as visiting a doctor's office or shopping?: Yes  MMSE - Mini Mental State Exam 06/19/2020 06/14/2019 03/24/2018  Orientation to time 5 5 5   Orientation to Place 5 5 5   Registration 3 3 3   Attention/ Calculation 5 5 5   Recall 3 3 3   Language- name 2 objects 2 2 2   Language- repeat 1 1 1   Language- follow 3 step command 3 3 3   Language- read & follow direction 1 1 1   Write a sentence 1 1 0  Copy design 1 1 1   Total score 30 30 29     Fall Risk  06/19/2020 12/20/2019 06/14/2019 05/26/2018 03/24/2018  Falls in the past year? 0 1 0 No Yes  Number falls in past yr: - - - - 1  Injury with Fall? - - - - -    Assessment/Plan:  1. Encounter for general adult medical examination with abnormal findings Annual health maintenance exam today.   2. Essential hypertension Stable. Continue bp medication as prescribed   3. Parkinson disease (HCC) Continue regular visits with neurology as scheduled. Will refer for physical therapy to help with gain and strength building.  - Ambulatory referral to Home Health  4. Generalized weakness Continue regular visits with neurology as scheduled. Will refer for physical therapy to help with gain and strength building.  - Ambulatory referral to Home Health  5. Abnormal gait Continue regular visits with neurology as scheduled. Will refer for physical therapy to help with gain and strength building.  - Ambulatory referral to Home Health  6. Primary generalized hypertrophic osteoarthrosis Rarely takes tramadol, but does need new prescription today. Sent prescription for #45 tablets to her pharmacy.  - traMADol (ULTRAM) 50 MG tablet; Take 1 tablet (50 mg total) by mouth every 12 (twelve) hours as needed. for pain  Dispense: 45 tablet; Refill: 2  7. Generalized anxiety disorder Trial remeron 7.5mg  every evening. May continue clorazepate 3.75mg  up to twice daily as needed for  acute anxiety.  -  clorazepate (TRANXENE) 3.75 MG tablet; Take 1 tablet po 1 to 2 times daily as needed for anxiety.  Dispense: 60 tablet; Refill: 2 - mirtazapine (REMERON) 7.5 MG tablet; Take 1 tablet (7.5 mg total) by mouth at bedtime.  Dispense: 30 tablet; Refill: 2  8. B12 deficiency b12 injection administered today.  - cyanocobalamin ((VITAMIN B-12)) injection 1,000 mcg   General Counseling: Lakesha verbalizes understanding of the findings of todays visit and agrees with plan of treatment. I have discussed any further diagnostic evaluation that may be needed or ordered today. We also reviewed her medications today. she has been encouraged to call the office with any questions or concerns that should arise related to todays visit.    Counseling:  This patient was seen by Vincent Gros FNP Collaboration with Dr Lyndon Code as a part of collaborative care agreement  Orders Placed This Encounter  Procedures   Ambulatory referral to Home Health    Meds ordered this encounter  Medications   cyanocobalamin ((VITAMIN B-12)) injection 1,000 mcg   clorazepate (TRANXENE) 3.75 MG tablet    Sig: Take 1 tablet po 1 to 2 times daily as needed for anxiety.    Dispense:  60 tablet    Refill:  2    Order Specific Question:   Supervising Provider    Answer:   Lyndon Code [[id number]]   traMADol (ULTRAM) 50 MG tablet    Sig: Take 1 tablet (50 mg total) by mouth every 12 (twelve) hours as needed. for pain    Dispense:  45 tablet    Refill:  2    Order Specific Question:   Supervising Provider    Answer:   Lyndon Code [[id number]]   mirtazapine (REMERON) 7.5 MG tablet    Sig: Take 1 tablet (7.5 mg total) by mouth at bedtime.    Dispense:  30 tablet    Refill:  2    Order Specific Question:   Supervising Provider    Answer:   Lyndon Code [[id number]]    Total time spent: 60 Minutes  Time spent includes review of chart, medications, test results, and follow up plan with the patient.     Lyndon Code, MD  Internal Medicine

## 2020-06-20 ENCOUNTER — Telehealth: Payer: Self-pay

## 2020-06-20 DIAGNOSIS — R269 Unspecified abnormalities of gait and mobility: Secondary | ICD-10-CM | POA: Insufficient documentation

## 2020-06-20 DIAGNOSIS — F411 Generalized anxiety disorder: Secondary | ICD-10-CM | POA: Insufficient documentation

## 2020-06-20 NOTE — Telephone Encounter (Signed)
lmom and send message to [facility] for home health

## 2020-06-20 NOTE — Telephone Encounter (Signed)
GAVE HOMEHEALTH REFERAL TO [facility] DUE TO PT INSURANCE

## 2020-06-22 ENCOUNTER — Telehealth: Payer: Self-pay

## 2020-06-25 ENCOUNTER — Telehealth: Payer: Self-pay

## 2020-06-27 ENCOUNTER — Telehealth: Payer: Self-pay

## 2020-06-27 NOTE — Telephone Encounter (Signed)
lled and spoke to pt's caregiver Jennifer Snyder about pt's bottom.  Herbert Seta suggested to use desitin cream or ointment for her bottom and also to get a donut pillow to help.

## 2020-06-27 NOTE — Telephone Encounter (Signed)
Done

## 2020-06-27 NOTE — Telephone Encounter (Signed)
She is the one I suggested Desitin cream to protect the skin and round, donut cushion.

## 2020-07-03 ENCOUNTER — Other Ambulatory Visit: Payer: Self-pay

## 2020-07-03 MED ORDER — PANTOPRAZOLE SODIUM 40 MG PO TBEC: DELAYED_RELEASE_TABLET | ORAL | 3 refills | Status: DC

## 2020-07-05 ENCOUNTER — Other Ambulatory Visit: Payer: Self-pay

## 2020-07-05 MED ORDER — OXYBUTYNIN CHLORIDE 5 MG PO TABS: 5.0000 mg | ORAL_TABLET | Freq: Every day | ORAL | 1 refills | Status: DC

## 2020-07-09 ENCOUNTER — Other Ambulatory Visit: Payer: Self-pay

## 2020-07-09 MED ORDER — METOPROLOL SUCCINATE ER 25 MG PO TB24: ORAL_TABLET | ORAL | 3 refills | Status: DC

## 2020-07-16 ENCOUNTER — Other Ambulatory Visit: Payer: Self-pay

## 2020-07-16 MED ORDER — FUROSEMIDE 20 MG PO TABS: 20.0000 mg | ORAL_TABLET | ORAL | 1 refills | Status: DC

## 2020-08-16 ENCOUNTER — Other Ambulatory Visit: Payer: Self-pay

## 2020-08-16 MED ORDER — GABAPENTIN 100 MG PO CAPS: ORAL_CAPSULE | ORAL | 2 refills | Status: DC

## 2020-09-12 ENCOUNTER — Other Ambulatory Visit: Payer: Self-pay

## 2020-09-12 DIAGNOSIS — F411 Generalized anxiety disorder: Secondary | ICD-10-CM

## 2020-09-12 MED ORDER — FUROSEMIDE 20 MG PO TABS: 20.0000 mg | ORAL_TABLET | ORAL | 1 refills | Status: DC

## 2020-09-12 MED ORDER — MIRTAZAPINE 7.5 MG PO TABS: 7.5000 mg | ORAL_TABLET | Freq: Every day | ORAL | 1 refills | Status: DC

## 2020-09-18 ENCOUNTER — Encounter: Payer: Self-pay | Admitting: Hospice and Palliative Medicine

## 2020-09-18 ENCOUNTER — Other Ambulatory Visit: Payer: Self-pay

## 2020-09-18 ENCOUNTER — Ambulatory Visit (INDEPENDENT_AMBULATORY_CARE_PROVIDER_SITE_OTHER): Payer: Medicare Other | Admitting: Hospice and Palliative Medicine

## 2020-09-18 VITALS — BP 129/59 | HR 95 | Temp 97.6°F | Resp 16 | Ht 61.0 in | Wt 150.0 lb

## 2020-09-18 DIAGNOSIS — G20A1 Parkinson's disease without dyskinesia, without mention of fluctuations: Secondary | ICD-10-CM

## 2020-09-18 DIAGNOSIS — E1165 Type 2 diabetes mellitus with hyperglycemia: Secondary | ICD-10-CM

## 2020-09-18 DIAGNOSIS — G20 Parkinson's disease: Secondary | ICD-10-CM | POA: Diagnosis not present

## 2020-09-18 DIAGNOSIS — E538 Deficiency of other specified B group vitamins: Secondary | ICD-10-CM | POA: Diagnosis not present

## 2020-09-18 DIAGNOSIS — F5101 Primary insomnia: Secondary | ICD-10-CM

## 2020-09-18 DIAGNOSIS — R300 Dysuria: Secondary | ICD-10-CM

## 2020-09-18 LAB — POCT GLYCOSYLATED HEMOGLOBIN (HGB A1C): Hemoglobin A1C: 6.6 % — AB (ref 4.0–5.6)

## 2020-09-18 MED ORDER — CYANOCOBALAMIN 1000 MCG/ML IJ SOLN
1000.0000 ug | Freq: Once | INTRAMUSCULAR | Status: AC
  Administered 2020-09-18: 1000 ug via INTRAMUSCULAR

## 2020-09-18 MED ORDER — CLOTRIMAZOLE-BETAMETHASONE 1-0.05 % EX CREA: 1.0000 "application " | TOPICAL_CREAM | Freq: Two times a day (BID) | CUTANEOUS | 1 refills | Status: DC

## 2020-09-18 NOTE — Progress Notes (Signed)
[facility] [address] [place], [place] [address]  Internal MEDICINE  Office Visit Note  Patient Name: Jennifer Snyder  [id number]  [id number]  Date of Service: 09/22/2020  Chief Complaint  Patient presents with  . Follow-up  . Hyperlipidemia  . Hypertension  . Quality Metric Gaps    Covid booster    HPI Patient is here for routine follow-up Accompanied by her caregiver today--she is in her wheelchair, history of Parkinson's disease At last visit she was prescribed Remeron to help with insomnia--she has been sleeping much better since starting this medication Caregiver explains at last visit with neurologist--Dr. Malvin Johns he prescribed her trazodone to also help with her sleep--they have not been giving her the trazodone as she is sleeping well with Remeron DM-when monitored at home glucose readings average 100-120  No recent changes in appetite, no issues with constipation, no skin breakdown Caregiver brought along sample of urine from home in sterile container, explains she has noticed over the last few days her urine has been darker in color and has an odor She denies further urinary symptoms  Current Medication: Outpatient Encounter Medications as of 09/18/2020  Medication Sig  . aspirin 81 MG chewable tablet Chew by mouth.  . carbidopa-levodopa (SINEMET) 25-100 MG tablet Take 1 tablet by mouth 2 (two) times daily.   . Carbidopa-Levodopa ER (SINEMET CR) 25-100 MG tablet controlled release Take 1 tablet by mouth at bedtime.   . Cholecalciferol (VITAMIN D3 PO) Take by mouth daily.  . clorazepate (TRANXENE) 3.75 MG tablet Take 1 tablet po 1 to 2 times daily as needed for anxiety.  . furosemide (LASIX) 20 MG tablet Take 1 tablet (20 mg total) by mouth every other day.  . gabapentin (NEURONTIN) 100 MG capsule Take 1 capsule po twice daily as needed for leg and nerve pain  . methocarbamol (ROBAXIN) 750 MG tablet Take 1 tablet (750 mg total) by mouth every 6 (six) hours  as needed for muscle spasms.  . metoprolol succinate (TOPROL-XL) 25 MG 24 hr tablet Start with half tablet a day and may increase to one tablet daily  . mirtazapine (REMERON) 7.5 MG tablet Take 1 tablet (7.5 mg total) by mouth at bedtime.  Marland Kitchen oxybutynin (DITROPAN) 5 MG tablet Take 1 tablet (5 mg total) by mouth daily.  . pantoprazole (PROTONIX) 40 MG tablet Take one tablet by mouth daily  . polyethylene glycol (MIRALAX / GLYCOLAX) packet Take 17 g by mouth daily as needed for mild constipation.   . traMADol (ULTRAM) 50 MG tablet Take 1 tablet (50 mg total) by mouth every 12 (twelve) hours as needed. for pain  . [DISCONTINUED] clotrimazole-betamethasone (LOTRISONE) cream Apply 1 application topically 2 (two) times daily.  . [EXPIRED] cyanocobalamin ((VITAMIN B-12)) injection 1,000 mcg    No facility-administered encounter medications on file as of 09/18/2020.    Surgical History: Past Surgical History:  Procedure Laterality Date  . ABDOMINAL HERNIA REPAIR    . ADENOIDECTOMY  1994  . CATARACT EXTRACTION, BILATERAL  09/2014, 07/2014  . child birth  85, 63  . CHOLECYSTECTOMY  1989  . laser surgery on both eyes  11/2016  . left knee replacement  1993  . para esophogeal hernia repair    . TONSILLECTOMY  1944  . VAGINAL HYSTERECTOMY  1976    Medical History: Past Medical History:  Diagnosis Date  . Hyperlipidemia   . Hypertension   . Osteoarthritis   . Parkinson's disease (HCC)   . Peptic ulcer  disease     Family History: Family History  Problem Relation Age of Onset  . Lung cancer Mother   . Hyperlipidemia Mother   . Hypertension Mother   . Osteoarthritis Mother   . Stroke Mother     Social History   Socioeconomic History  . Marital status: Single    Spouse name: Not on file  . Number of children: Not on file  . Years of education: Not on file  . Highest education level: Not on file  Occupational History  . Not on file  Tobacco Use  . Smoking status: Never  Smoker  . Smokeless tobacco: Never Used  Substance and Sexual Activity  . Alcohol use: No  . Drug use: No  . Sexual activity: Not on file  Other Topics Concern  . Not on file  Social History Narrative  . Not on file   Social Determinants of Health   Financial Resource Strain: Not on file  Food Insecurity: Not on file  Transportation Needs: Not on file  Physical Activity: Not on file  Stress: Not on file  Social Connections: Not on file  Intimate Partner Violence: Not on file      Review of Systems  Constitutional: Negative for chills, diaphoresis and fatigue.  HENT: Negative for ear pain, postnasal drip and sinus pressure.   Eyes: Negative for photophobia, discharge, redness, itching and visual disturbance.  Respiratory: Negative for cough, shortness of breath and wheezing.   Cardiovascular: Negative for chest pain, palpitations and leg swelling.  Gastrointestinal: Negative for abdominal pain, constipation, diarrhea, nausea and vomiting.  Genitourinary: Negative for dysuria and flank pain.  Musculoskeletal: Negative for arthralgias, back pain, gait problem and neck pain.  Skin: Negative for color change.  Allergic/Immunologic: Negative for environmental allergies and food allergies.  Neurological: Negative for dizziness and headaches.  Hematological: Does not bruise/bleed easily.  Psychiatric/Behavioral: Negative for agitation, behavioral problems (depression) and hallucinations.    Vital Signs: BP (!) 129/59   Pulse 95   Temp 97.6 F (36.4 C)   Resp 16   Ht 5\' 1"  (1.549 m)   Wt 150 lb (68 kg)   SpO2 96%   BMI 28.34 kg/m    Physical Exam Vitals reviewed.  Constitutional:      Appearance: Normal appearance. She is normal weight.  Cardiovascular:     Rate and Rhythm: Normal rate and regular rhythm.     Pulses: Normal pulses.     Heart sounds: Normal heart sounds.  Pulmonary:     Effort: Pulmonary effort is normal.     Breath sounds: Normal breath sounds.   Abdominal:     General: Abdomen is flat.     Palpations: Abdomen is soft.  Musculoskeletal:        General: Normal range of motion.     Cervical back: Normal range of motion.  Skin:    General: Skin is warm.  Neurological:     General: No focal deficit present.     Mental Status: She is alert and oriented to person, place, and time. Mental status is at baseline.  Psychiatric:        Mood and Affect: Mood normal.        Behavior: Behavior normal.        Thought Content: Thought content normal.        Judgment: Judgment normal.   Assessment/Plan: 1. Uncontrolled type 2 diabetes mellitus with hyperglycemia (HCC) A1C 6.6--well controlled Continue with current therapy and routine monitoring -  POCT HgB A1C  2. Parkinson disease (HCC) Stable, followed by neurology  3. Primary insomnia May continue with Remeron as insomnia has significantly improved--continue to monitor Advised caregiver that as long as Remeron has been working she can hold trazodone at this time, will need to update Dr. Malvin Johns at next follow-up  4. Dysuria Will await culture results and treat accordingly - UA/M w/rflx Culture, Routine - Microscopic Examination - Urine Culture, Reflex  5. B12 deficiency - cyanocobalamin ((VITAMIN B-12)) injection 1,000 mcg  General Counseling: Wania verbalizes understanding of the findings of todays visit and agrees with plan of treatment. I have discussed any further diagnostic evaluation that may be needed or ordered today. We also reviewed her medications today. she has been encouraged to call the office with any questions or concerns that should arise related to todays visit.    Orders Placed This Encounter  Procedures  . Microscopic Examination  . Urine Culture, Reflex  . UA/M w/rflx Culture, Routine  . POCT HgB A1C    Meds ordered this encounter  Medications  . cyanocobalamin ((VITAMIN B-12)) injection 1,000 mcg    Time spent: 30 Minutes Time spent includes  review of chart, medications, test results and follow-up plan with the patient.  This patient was seen by Leeanne Deed AGNP-C in Collaboration with Dr Lyndon Code as a part of collaborative care agreement     Lubertha Basque. Fatimah Sundquist AGNP-C Internal medicine

## 2020-09-22 ENCOUNTER — Encounter: Payer: Self-pay | Admitting: Hospice and Palliative Medicine

## 2020-09-24 ENCOUNTER — Other Ambulatory Visit: Payer: Self-pay | Admitting: Nurse Practitioner

## 2020-09-24 LAB — UA/M W/RFLX CULTURE, ROUTINE
Bilirubin, UA: NEGATIVE
Glucose, UA: NEGATIVE
Ketones, UA: NEGATIVE
Nitrite, UA: NEGATIVE
RBC, UA: NEGATIVE
Specific Gravity, UA: 1.016 (ref 1.005–1.030)
Urobilinogen, Ur: 0.2 mg/dL (ref 0.2–1.0)
pH, UA: 7 (ref 5.0–7.5)

## 2020-09-24 LAB — URINE CULTURE, REFLEX

## 2020-09-24 LAB — MICROSCOPIC EXAMINATION
Casts: NONE SEEN /lpf
Epithelial Cells (non renal): NONE SEEN /hpf (ref 0–10)
WBC, UA: >30 /hpf — AB (ref 0–5)

## 2020-09-25 ENCOUNTER — Other Ambulatory Visit: Payer: Self-pay | Admitting: Hospice and Palliative Medicine

## 2020-09-25 ENCOUNTER — Telehealth: Payer: Self-pay

## 2020-09-25 MED ORDER — SULFAMETHOXAZOLE-TRIMETHOPRIM 400-80 MG PO TABS: 1.0000 | ORAL_TABLET | Freq: Two times a day (BID) | ORAL | 0 refills | Status: DC

## 2020-09-25 NOTE — Progress Notes (Signed)
Please call and let her know that she does have bacterial growth in her urine and that I have sent an antibiotic to her pharmacy. Thanks!

## 2020-09-25 NOTE — Telephone Encounter (Signed)
-----   Message from Theotis Burrow, NP sent at 09/25/2020  4:27 PM EST ----- Please call and let her know that she does have bacterial growth in her urine and that I have sent an antibiotic to her pharmacy. Thanks!

## 2020-09-25 NOTE — Telephone Encounter (Signed)
PT notified

## 2020-09-27 ENCOUNTER — Other Ambulatory Visit: Payer: Self-pay | Admitting: Nurse Practitioner

## 2020-10-22 ENCOUNTER — Other Ambulatory Visit: Payer: Self-pay | Admitting: Internal Medicine

## 2020-10-31 ENCOUNTER — Other Ambulatory Visit: Payer: Self-pay | Admitting: Hospice and Palliative Medicine

## 2020-10-31 DIAGNOSIS — M150 Primary generalized (osteo)arthritis: Secondary | ICD-10-CM

## 2020-10-31 MED ORDER — TRAMADOL HCL 50 MG PO TABS: 50.0000 mg | ORAL_TABLET | Freq: Two times a day (BID) | ORAL | 2 refills | Status: DC | PRN

## 2020-11-06 ENCOUNTER — Other Ambulatory Visit: Payer: Self-pay | Admitting: Nurse Practitioner

## 2020-11-08 ENCOUNTER — Other Ambulatory Visit: Payer: Self-pay | Admitting: Internal Medicine

## 2020-11-10 ENCOUNTER — Other Ambulatory Visit: Payer: Self-pay | Admitting: Internal Medicine

## 2020-11-10 DIAGNOSIS — F411 Generalized anxiety disorder: Secondary | ICD-10-CM

## 2020-11-21 IMAGING — CR DG CHEST 2V
2 series · 2 of 2 positions shown · non-contrast
Comparison: 04/27/2012

CLINICAL DATA: Weakness

EXAM:
CHEST - 2 VIEW

[chest lat]
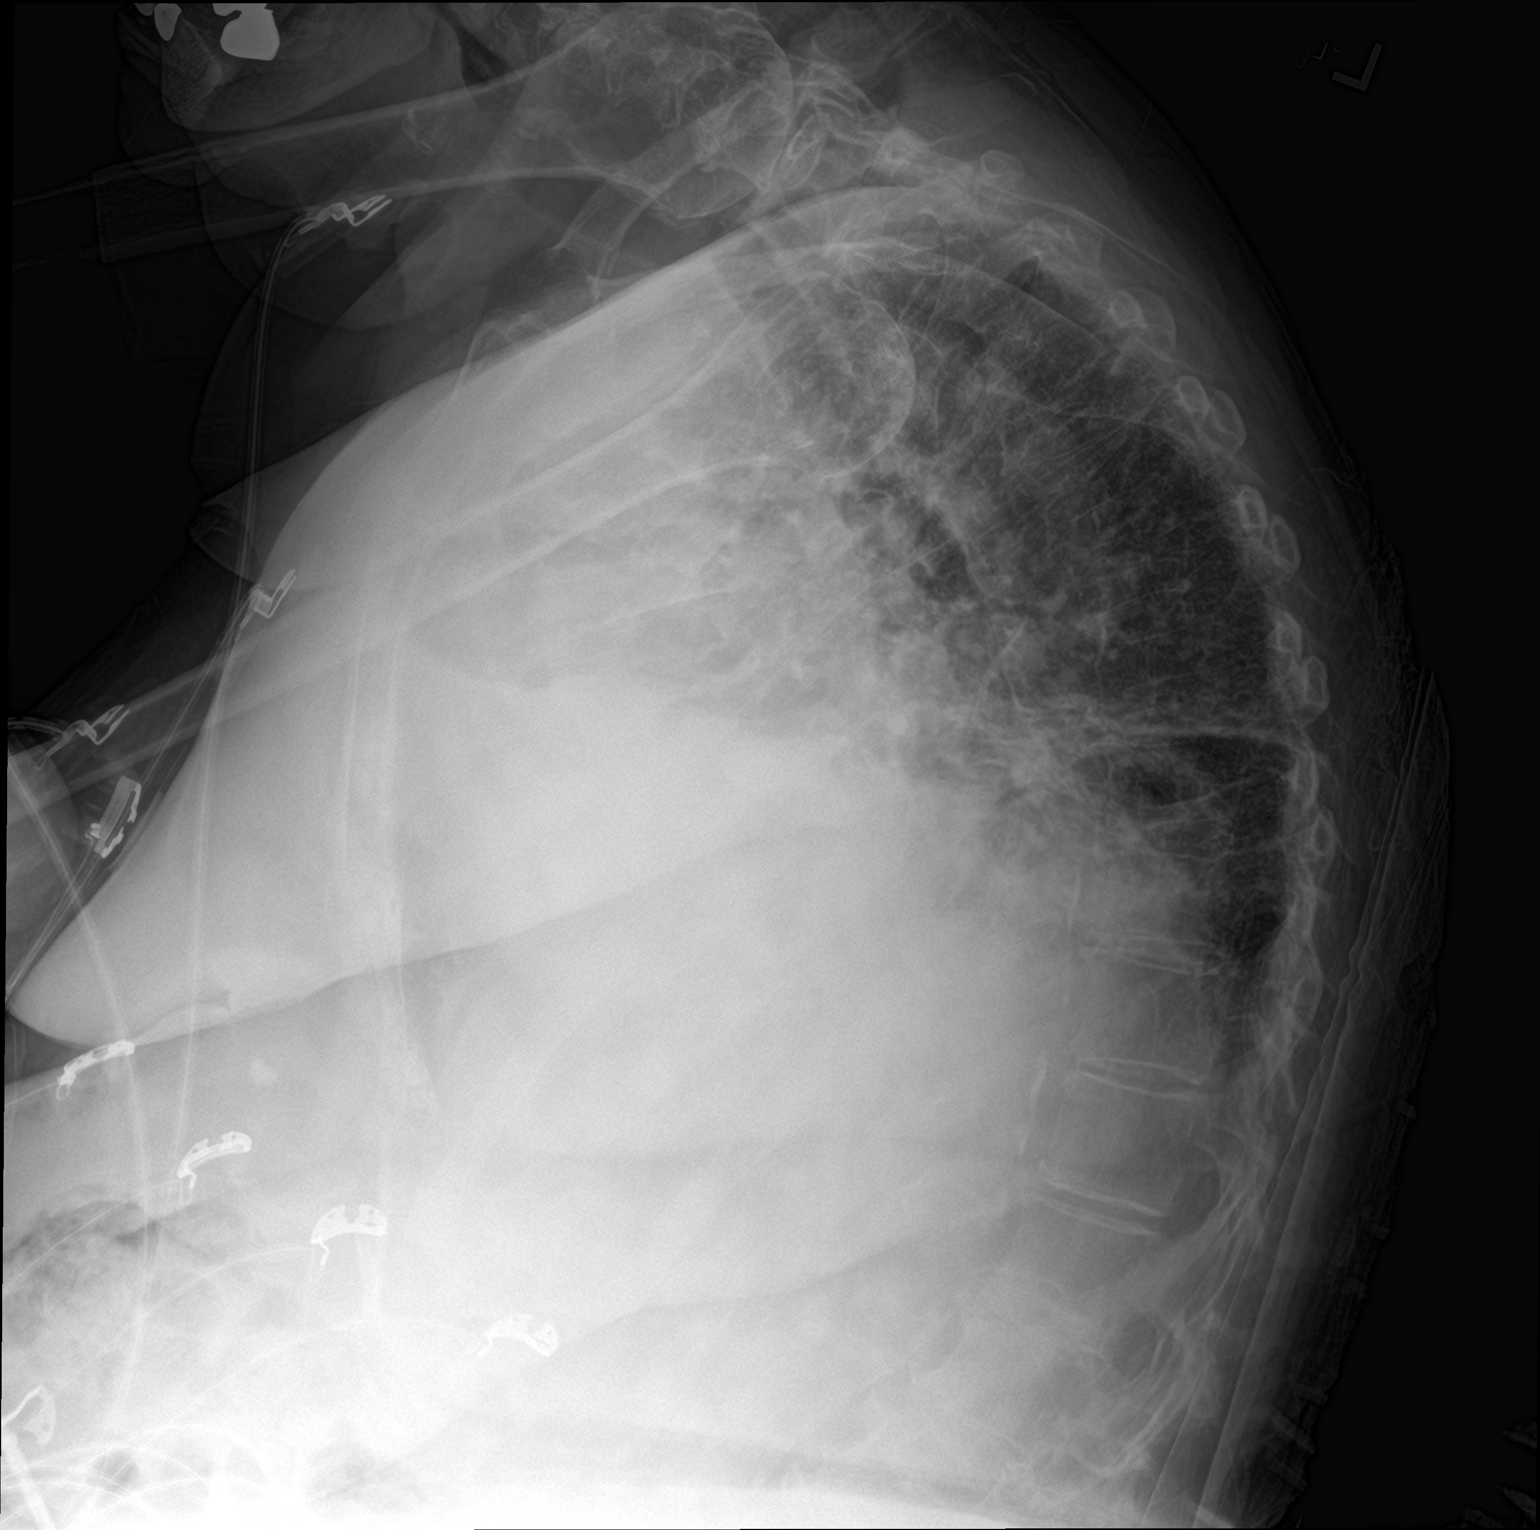

[chest ap]
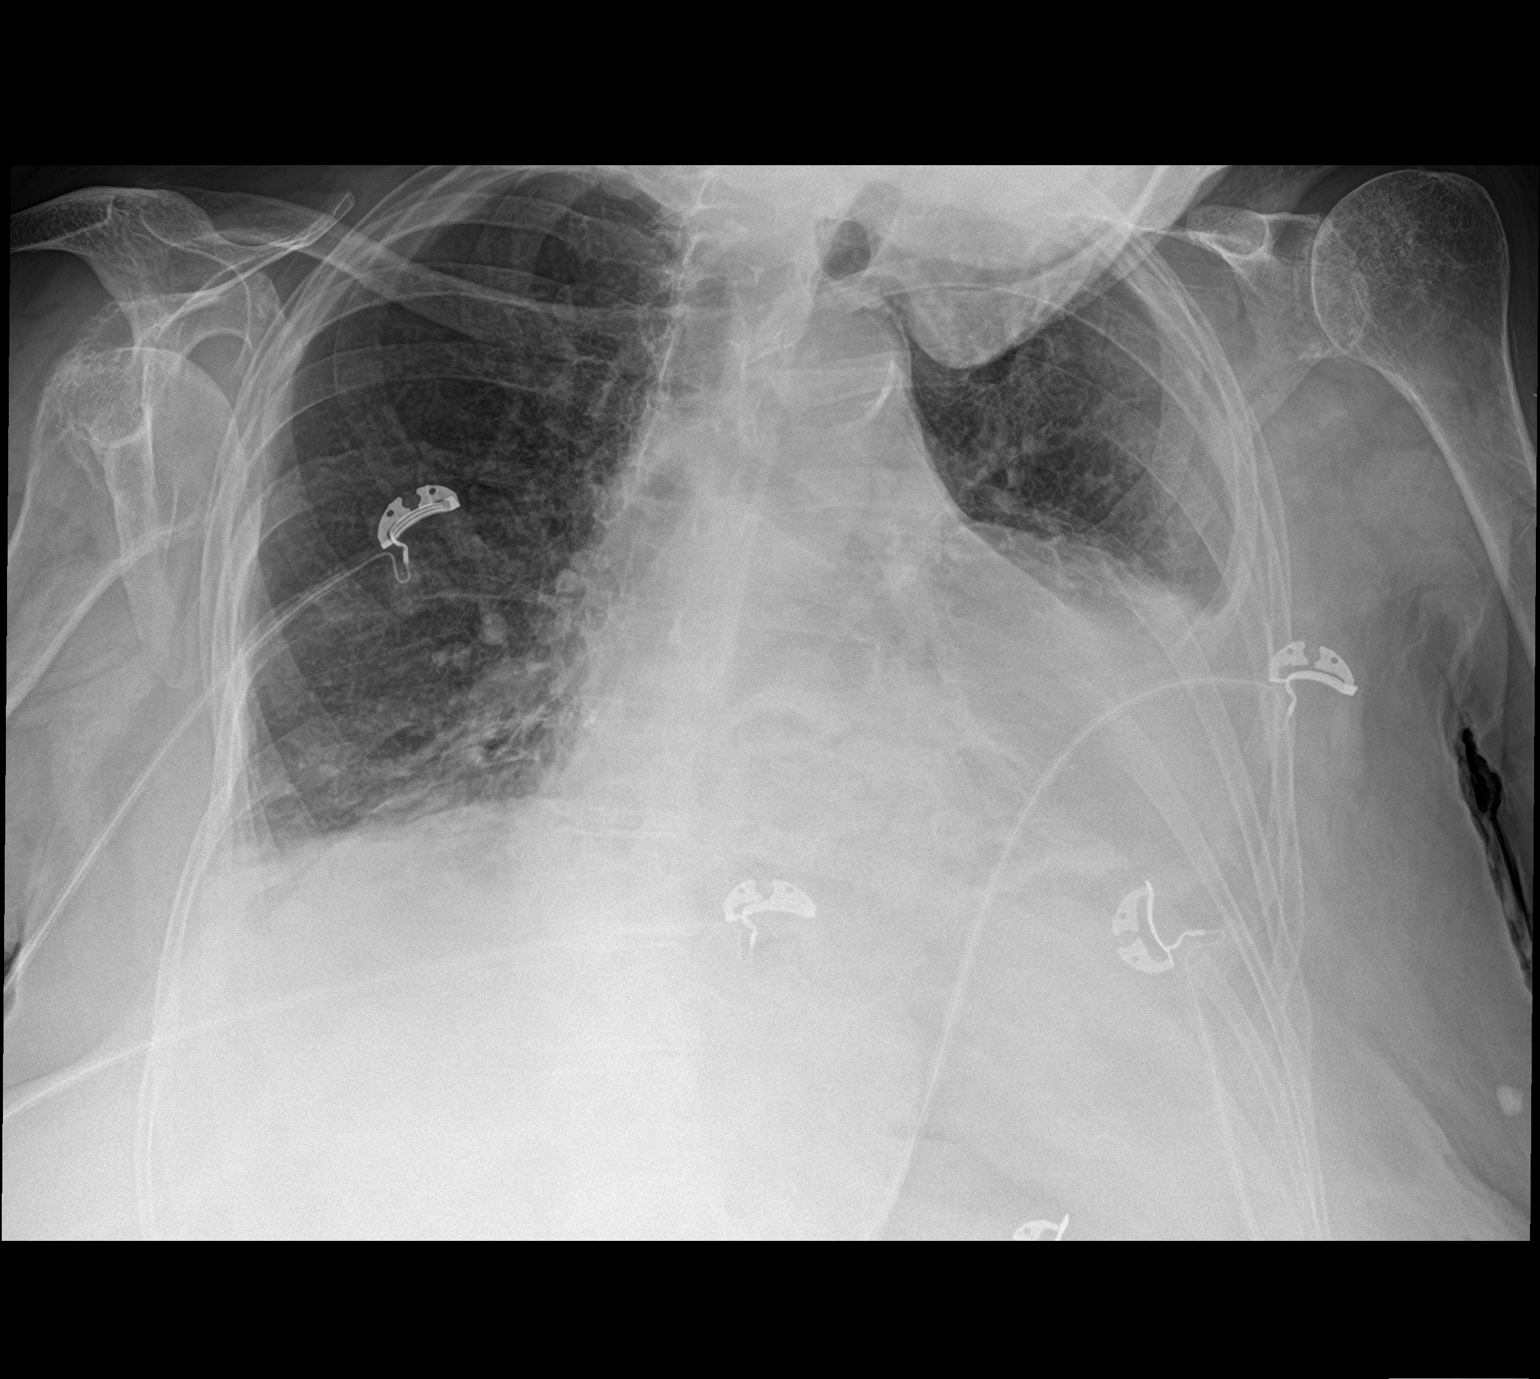

[2 of 2 positions shown; findings below may reference images not displayed]

FINDINGS: Cardiomegaly left lower lobe airspace opacity with probable left
effusion. Right base atelectasis or infiltrate. Small right effusion
suspected. No acute bony abnormality.
IMPRESSION: Cardiomegaly.

Moderate to large left effusion and small right effusion. Bibasilar
airspace opacities, left greater than right could reflect
atelectasis or infiltrates.

## 2020-11-21 IMAGING — CT CT CHEST W/O CM
2 of 3 series · 15 of 36 positions shown, 18 images · non-contrast
Comparison: Radiograph earlier this day

CLINICAL DATA: Hypertension. Weakness. Effusion versus pneumonia on
radiograph

EXAM:
CT CHEST WITHOUT CONTRAST
TECHNIQUE: Multidetector CT imaging of the chest was performed following the
standard protocol without IV contrast.

[Series 2: thorax · axial · 0.62mm/px · z∈[-440,-234]mm · 12 of 123 slices shown, 15 images]
[im 10/123  mediastinal]
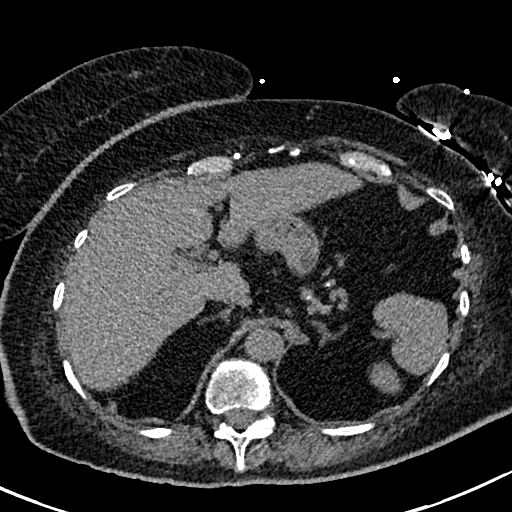
[im 10/123  lung]
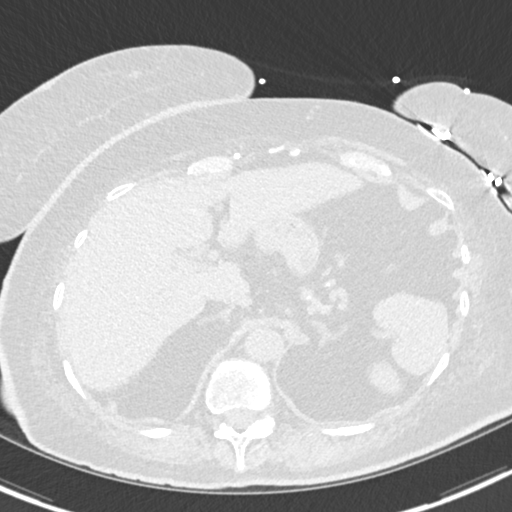
[im 19/123  lung]
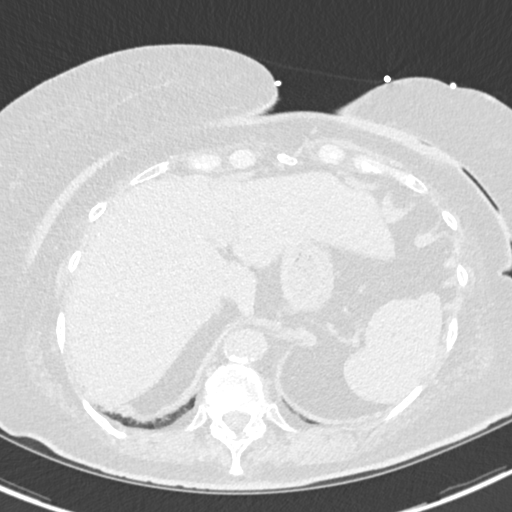
[im 28/123  lung]
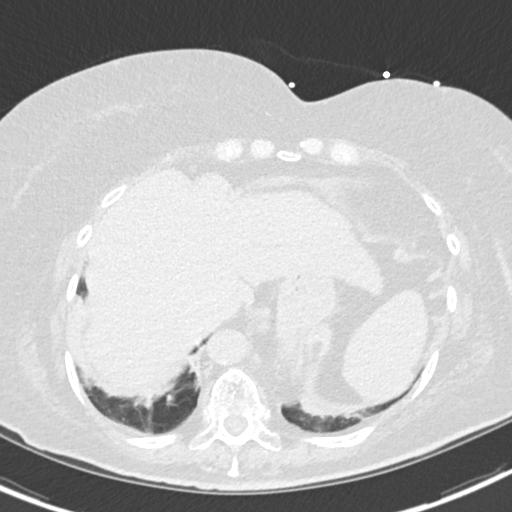
[im 37/123  lung]
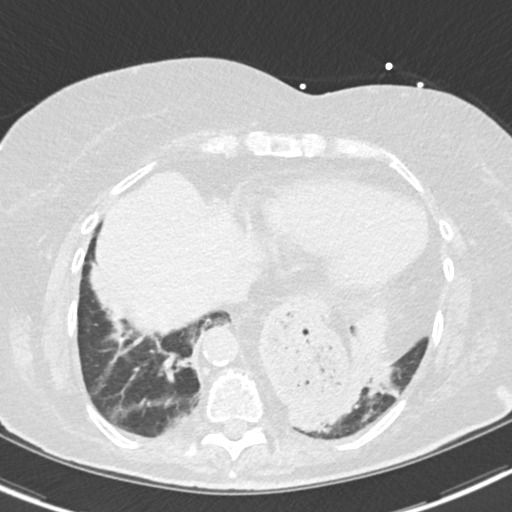
[im 46/123  mediastinal]
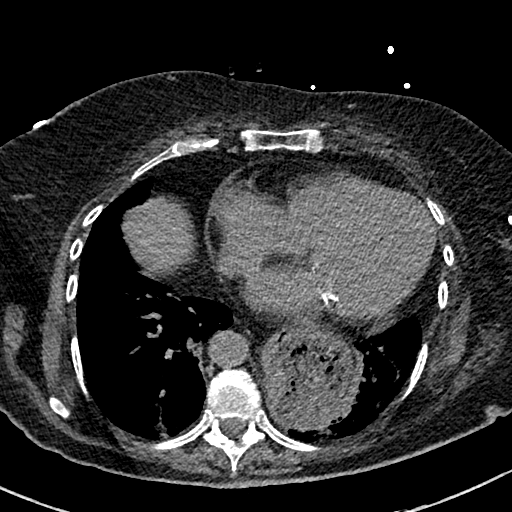
[im 46/123  lung]
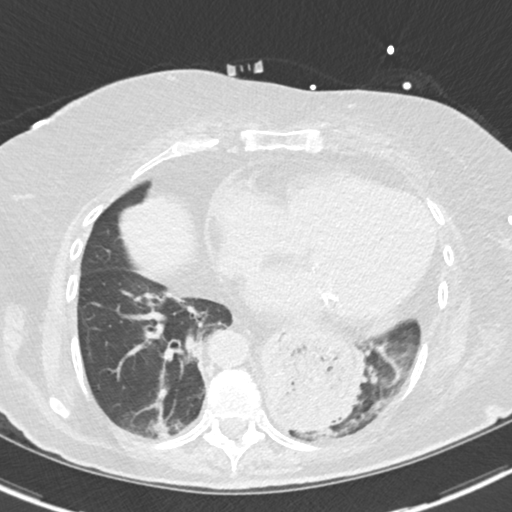
[im 55/123  lung]
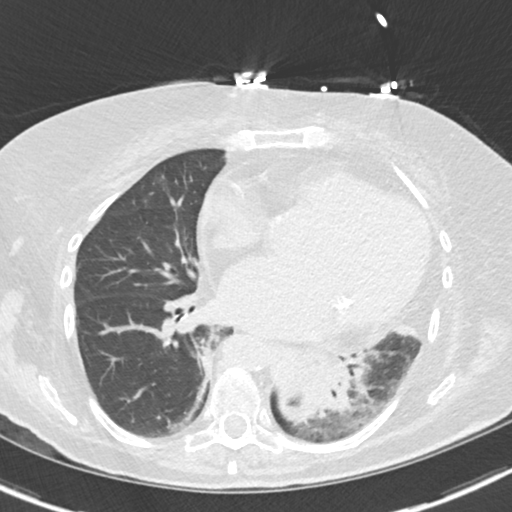
[im 68/123  lung]
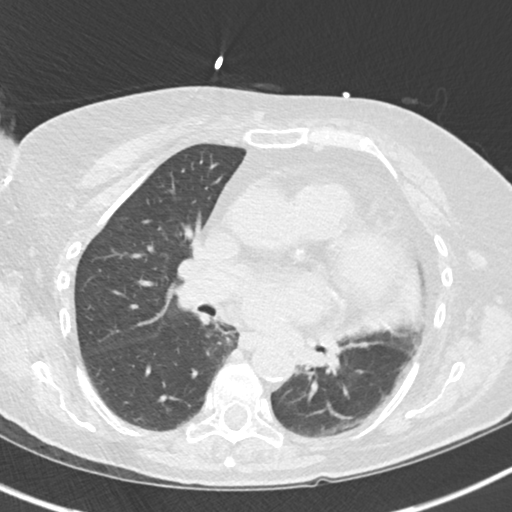
[im 77/123  lung]
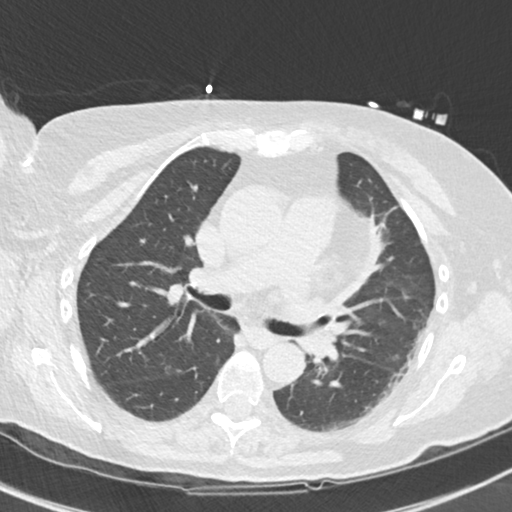
[im 86/123  mediastinal]
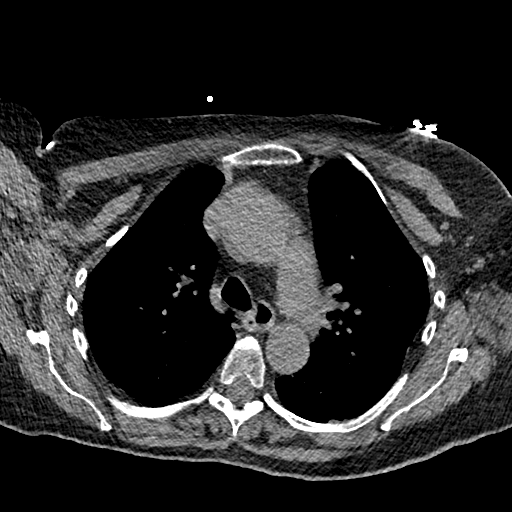
[im 86/123  lung]
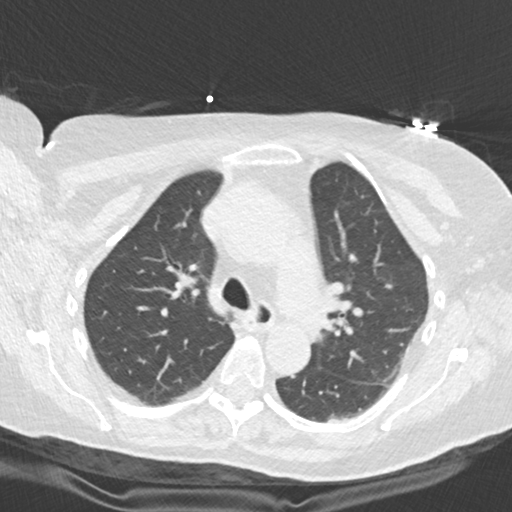
[im 95/123  lung]
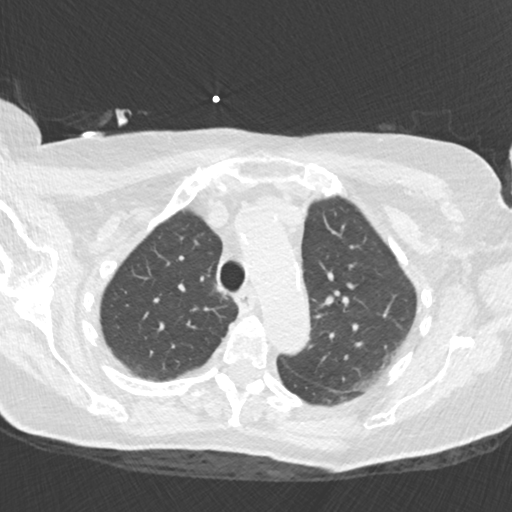
[im 104/123  lung]
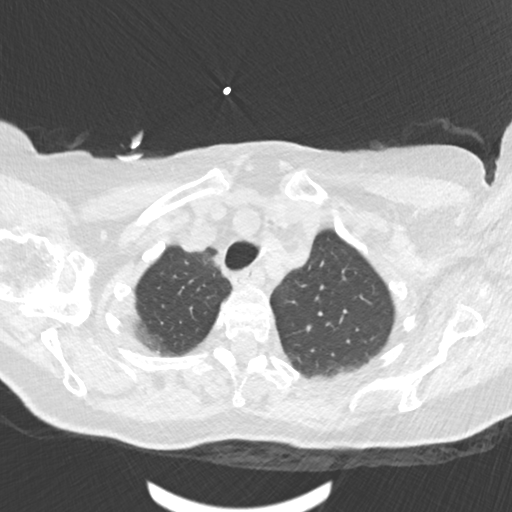
[im 113/123  lung]
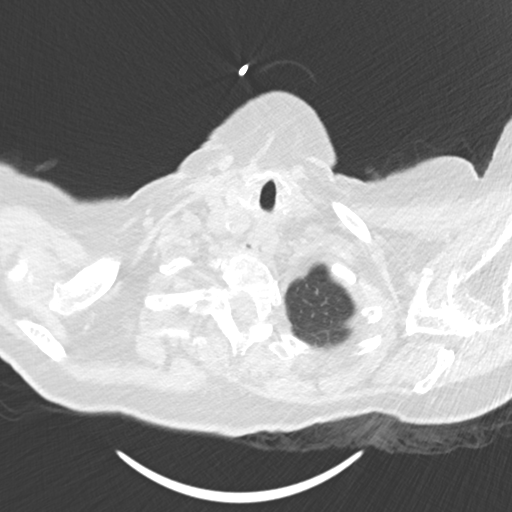

[Series 5: coronal · coronal · 0.49mm/px · 3 of 120 slices shown]
[im 24/120  lung]
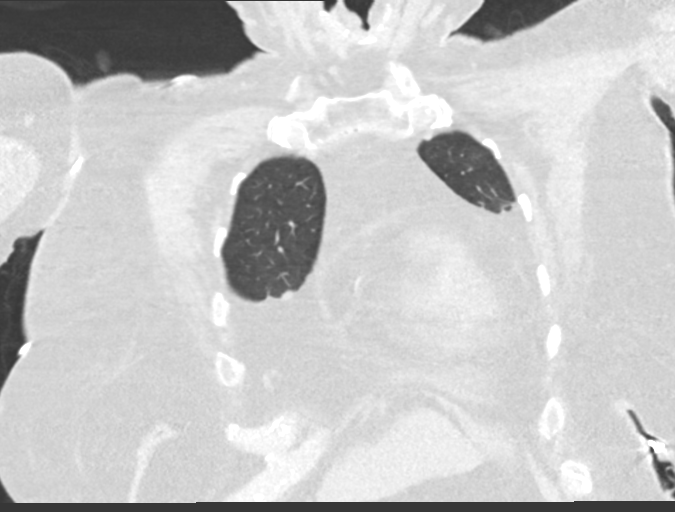
[im 48/120  lung]
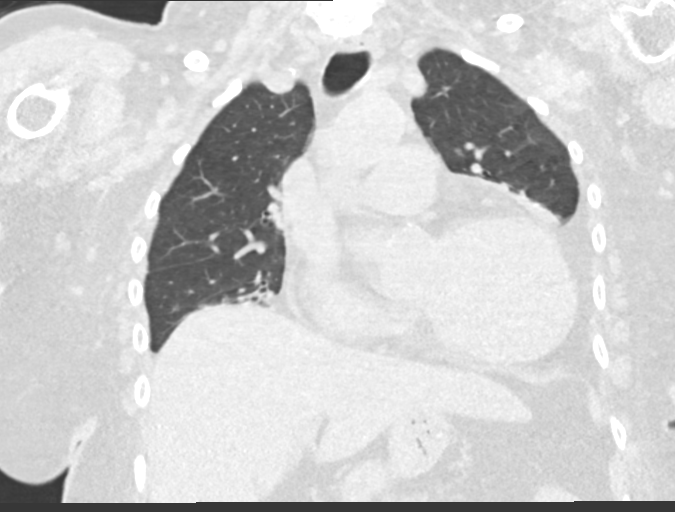
[im 72/120  lung]
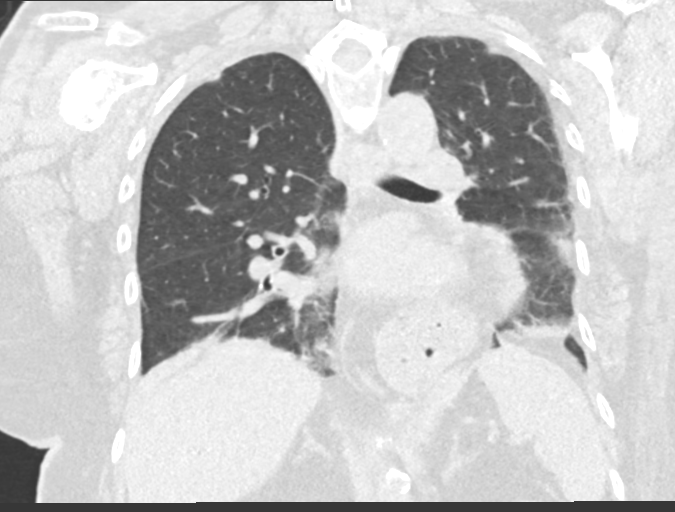

[15 of 36 positions shown; findings below may reference images not displayed]

FINDINGS: Cardiovascular: Aortic atherosclerosis and tortuosity. Multi chamber
cardiomegaly mitral annulus and coronary artery calcifications.
Minimal pericardial fluid without significant effusion.

Mediastinum/Nodes: Large hiatal hernia with greater than 50% of the
stomach intrathoracic. Mediastinal lipomatosis adjacent to the left
heart border. No esophageal wall thickening. No enlarged mediastinal
lymph nodes. No visualized thyroid nodule.

Lungs/Pleura: Compressive atelectasis in the left lower lobe
adjacent to hiatal hernia. Dependent atelectasis in both lower
lobes. Chronic volume loss in the medial right middle lobe. No
evidence of pneumonia. No significant pleural effusion. No evidence
of pulmonary edema.

Upper Abdomen: No acute findings.

Musculoskeletal: Exaggerated thoracic kyphosis. Mild degenerative
change in the thoracic spine. There are no acute or suspicious
osseous abnormalities. Chronic degenerative change of both
shoulders, right greater than left.
IMPRESSION: 1. No significant pleural effusion.  No evidence of pneumonia.
2. Left lung base opacity on radiograph is secondary to large hiatal
hernia and prominent epicardial fat pad.
3. Multi chamber cardiomegaly. Coronary artery calcifications.
4. Multifocal atelectasis.

Aortic Atherosclerosis (UZA75-VOF.F).

## 2020-11-25 ENCOUNTER — Other Ambulatory Visit: Payer: Self-pay | Admitting: Hospice and Palliative Medicine

## 2020-11-25 ENCOUNTER — Other Ambulatory Visit: Payer: Self-pay | Admitting: Nurse Practitioner

## 2020-11-26 ENCOUNTER — Other Ambulatory Visit: Payer: Self-pay

## 2020-11-26 MED ORDER — METOPROLOL SUCCINATE ER 25 MG PO TB24: ORAL_TABLET | ORAL | 3 refills | Status: DC

## 2020-12-18 ENCOUNTER — Other Ambulatory Visit: Payer: Self-pay | Admitting: Nurse Practitioner

## 2020-12-18 ENCOUNTER — Encounter: Payer: Self-pay | Admitting: Nurse Practitioner

## 2020-12-18 ENCOUNTER — Other Ambulatory Visit: Payer: Self-pay

## 2020-12-18 ENCOUNTER — Ambulatory Visit: Payer: Medicare Other | Admitting: Nurse Practitioner

## 2020-12-18 DIAGNOSIS — I10 Essential (primary) hypertension: Secondary | ICD-10-CM

## 2020-12-18 DIAGNOSIS — E782 Mixed hyperlipidemia: Secondary | ICD-10-CM | POA: Diagnosis not present

## 2020-12-18 DIAGNOSIS — I700 Atherosclerosis of aorta: Secondary | ICD-10-CM

## 2020-12-18 DIAGNOSIS — E538 Deficiency of other specified B group vitamins: Secondary | ICD-10-CM

## 2020-12-18 DIAGNOSIS — Z0000 Encounter for general adult medical examination without abnormal findings: Secondary | ICD-10-CM

## 2020-12-18 DIAGNOSIS — M150 Primary generalized (osteo)arthritis: Secondary | ICD-10-CM

## 2020-12-18 DIAGNOSIS — E119 Type 2 diabetes mellitus without complications: Secondary | ICD-10-CM | POA: Diagnosis not present

## 2020-12-18 DIAGNOSIS — F411 Generalized anxiety disorder: Secondary | ICD-10-CM

## 2020-12-18 DIAGNOSIS — Z79899 Other long term (current) drug therapy: Secondary | ICD-10-CM

## 2020-12-18 MED ORDER — CLORAZEPATE DIPOTASSIUM 3.75 MG PO TABS: ORAL_TABLET | ORAL | 2 refills | Status: DC

## 2020-12-18 MED ORDER — TRAMADOL HCL 50 MG PO TABS: 50.0000 mg | ORAL_TABLET | Freq: Two times a day (BID) | ORAL | 2 refills | Status: DC | PRN

## 2020-12-18 MED ORDER — CYANOCOBALAMIN 1000 MCG/ML IJ SOLN
1000.0000 ug | Freq: Once | INTRAMUSCULAR | Status: AC
  Administered 2020-12-18: 1000 ug via INTRAMUSCULAR

## 2020-12-18 NOTE — Progress Notes (Signed)
[facility] [place], [place] [address]  Internal MEDICINE  Office Visit Note  Patient Name: Jennifer Snyder  [id number]  [id number]  Date of Service: 12/20/2020  Chief Complaint  Patient presents with  . Follow-up    Discuss booster, refills  . Hyperlipidemia  . Hypertension    HPI Jessamyn presents for follow up to discuss booster and refills, HLD, and HTN. She is accompanied by her nurse aid. She has a history of parkinson's disease and is taking several medications to control symptoms. She is hard of hearing and quiet during the visit, intermittently engaging in conversation.  -the nurse aid asked if Deamber needs the 4th COVID dose (the second booster). Discussed this with the patient and the nurse aid. The patient is around very few people, mostly just the staff that take care of her in her home. Due to her age and her difficulty getting out of the house often, she does not need the 4th COVID vaccine dose.  -blood pressure is well-controlled on current medications.  -history of hyperlipidemia. Marquesa has not had labs drawn to check her lipid profile in 2 years. She is not taking any medications to lower her cholesterol.  Type 2 dm, it is not treated, controlled with diet . Last hg aic is 6.6, down from 7   Current Medication: Outpatient Encounter Medications as of 12/18/2020  Medication Sig  . [DISCONTINUED] traMADol (ULTRAM) 50 MG tablet Take 1 tablet (50 mg total) by mouth every 12 (twelve) hours as needed. for pain  . aspirin 81 MG chewable tablet Chew by mouth.  . carbidopa-levodopa (SINEMET) 25-100 MG tablet Take 1 tablet by mouth 2 (two) times daily.   . Carbidopa-Levodopa ER (SINEMET CR) 25-100 MG tablet controlled release Take 1 tablet by mouth at bedtime.   . Cholecalciferol (VITAMIN D3 PO) Take by mouth daily.  . clorazepate (TRANXENE) 3.75 MG tablet Take 1 tablet po 1 to 2 times daily as needed for anxiety.  . clotrimazole-betamethasone (LOTRISONE)  cream APPLY TO AFFECTED AREA TWICE A DAY  . furosemide (LASIX) 20 MG tablet TAKE 1 TABLET BY MOUTH EVERY OTHER DAY  . gabapentin (NEURONTIN) 100 MG capsule TAKE 1 CAPSULE BY MOUTH TWICE DAILY AS NEEDED FOR LEG AND NERVE PAIN  . methocarbamol (ROBAXIN) 750 MG tablet Take 1 tablet (750 mg total) by mouth every 6 (six) hours as needed for muscle spasms.  . metoprolol succinate (TOPROL-XL) 25 MG 24 hr tablet Start with half tablet a day and may increase to one tablet daily  . mirtazapine (REMERON) 7.5 MG tablet TAKE 1 TABLET BY MOUTH AT BEDTIME.  Marland Kitchen oxybutynin (DITROPAN) 5 MG tablet TAKE 1 TABLET BY MOUTH EVERY DAY  . pantoprazole (PROTONIX) 40 MG tablet TAKE 1 TABLET BY MOUTH EVERY DAY  . polyethylene glycol (MIRALAX / GLYCOLAX) packet Take 17 g by mouth daily as needed for mild constipation.   . sulfamethoxazole-trimethoprim (BACTRIM) 400-80 MG tablet Take 1 tablet by mouth 2 (two) times daily.  . traMADol (ULTRAM) 50 MG tablet Take 1 tablet (50 mg total) by mouth every 12 (twelve) hours as needed. for pain  . [DISCONTINUED] clorazepate (TRANXENE) 3.75 MG tablet Take 1 tablet po 1 to 2 times daily as needed for anxiety.  . [EXPIRED] cyanocobalamin ((VITAMIN B-12)) injection 1,000 mcg    No facility-administered encounter medications on file as of 12/18/2020.    Surgical History: Past Surgical History:  Procedure Laterality Date  . ABDOMINAL HERNIA REPAIR    .  ADENOIDECTOMY  1994  . CATARACT EXTRACTION, BILATERAL  09/2014, 07/2014  . child birth  1956, 1959  . CHOLECYSTECTOMY  1989  . laser surgery on both eyes  11/2016  . left knee replacement  1993  . para esophogeal hernia repair    . TONSILLECTOMY  1944  . VAGINAL HYSTERECTOMY  1976    Medical History: Past Medical History:  Diagnosis Date  . Hyperlipidemia   . Hypertension   . Osteoarthritis   . Parkinson's disease ([place])   . Peptic ulcer disease     Family History: Family History  Problem Relation Age of Onset  . Lung  cancer Mother   . Hyperlipidemia Mother   . Hypertension Mother   . Osteoarthritis Mother   . Stroke Mother     Social History   Socioeconomic History  . Marital status: Single    Spouse name: Not on file  . Number of children: Not on file  . Years of education: Not on file  . Highest education level: Not on file  Occupational History  . Not on file  Tobacco Use  . Smoking status: Never Smoker  . Smokeless tobacco: Never Used  Substance and Sexual Activity  . Alcohol use: No  . Drug use: No  . Sexual activity: Not on file  Other Topics Concern  . Not on file  Social History Narrative  . Not on file   Social Determinants of Health   Financial Resource Strain: Not on file  Food Insecurity: Not on file  Transportation Needs: Not on file  Physical Activity: Not on file  Stress: Not on file  Social Connections: Not on file  Intimate Partner Violence: Not on file      Review of Systems  Constitutional: Negative.   HENT: Negative.   Respiratory: Negative.   Cardiovascular: Negative.   Gastrointestinal: Negative.   Genitourinary: Negative.   Neurological: Positive for tremors and weakness.       Symptoms controlled with medication    Vital Signs: BP (!) 117/55   Pulse 63   Temp (!) 97.1 F (36.2 C)   Resp 16   Ht 5' (1.524 m)   Wt 150 lb (68 kg)   SpO2 97%   BMI 29.29 kg/m    Physical Exam Vitals reviewed. Exam conducted with a chaperone present.  Constitutional:      General: She is not in acute distress.    Appearance: She is overweight. She is not ill-appearing or toxic-appearing.  HENT:     Head: Normocephalic and atraumatic.  Cardiovascular:     Rate and Rhythm: Normal rate and regular rhythm.     Pulses: Normal pulses.     Heart sounds: Normal heart sounds.  Pulmonary:     Effort: Pulmonary effort is normal.     Breath sounds: Normal breath sounds.  Musculoskeletal:     Cervical back: Normal range of motion and neck supple.  Skin:     General: Skin is warm and dry.     Capillary Refill: Capillary refill takes less than 2 seconds.  Neurological:     Mental Status: Mental status is at baseline.     Motor: Weakness present.     Comments: Wheelchair bound  Psychiatric:        Attention and Perception: She is inattentive.        Mood and Affect: Affect is flat.        Speech: Speech is delayed.  Behavior: Behavior is slowed and withdrawn.        Cognition and Memory: Cognition is impaired.    Assessment/Plan: 1. Essential hypertension Blood pressure controlled with current medication. No changes to medication regimen.   2. Mixed hyperlipidemia No lipid profile drawn in the past 2 years. She is not taking any medication for this but the lipid panel should be monitored -Lipid profile  3. B12 deficiency Gets monthly B12 injections - cyanocobalamin ((VITAMIN B-12)) injection 1,000 mcg  4. Labs encounter  Routine health maintenance lebs have not been drawn in 2 years. - CBC with Differential/Platelet - CMP14+EGFR - Lipid Profile  5. Primary generalized hypertrophic osteoarthrosis Pain managed with tramadol. Tramadol refilled, - traMADol (ULTRAM) 50 MG tablet; Take 1 tablet (50 mg total) by mouth every 12 (twelve) hours as needed. for pain  Dispense: 45 tablet; Refill: 2  6. Generalized anxiety disorder controlled with current medication, clorazepate refilled. - clorazepate (TRANXENE) 3.75 MG tablet; Take 1 tablet po 1 to 2 times daily as needed for anxiety.  Dispense: 60 tablet; Refill: 2  7. Diet-controlled diabetes mellitus ([place]) Pt has improving hg a1c, will check on next visit, diet controlled dm   8. Aortic atherosclerosis (HCC) Pt is unable to tolerate statin   General Counseling: Lorijean verbalizes understanding of the findings of todays visit and agrees with plan of treatment. I have discussed any further diagnostic evaluation that may be needed or ordered today. We also reviewed her medications  today. she has been encouraged to call the office with any questions or concerns that should arise related to todays visit.    Orders Placed This Encounter  Procedures  . CBC with Differential/Platelet  . CMP14+EGFR  . Lipid Profile    Meds ordered this encounter  Medications  . cyanocobalamin ((VITAMIN B-12)) injection 1,000 mcg  . traMADol (ULTRAM) 50 MG tablet    Sig: Take 1 tablet (50 mg total) by mouth every 12 (twelve) hours as needed. for pain    Dispense:  45 tablet    Refill:  2  . clorazepate (TRANXENE) 3.75 MG tablet    Sig: Take 1 tablet po 1 to 2 times daily as needed for anxiety.    Dispense:  60 tablet    Refill:  2   Return in about 1 month (around 01/18/2021) for F/U, Review labs/test, Jawaan Adachi PCP, B12 Qmonth.   Total time spent: 20 Minutes Time spent includes review of chart, medications, test results, and follow up plan with the patient.   [place] Controlled Substance Database was reviewed by me.  This patient was seen by Jonetta Osgood, FNP-C in collaboration with Dr. Clayborn Bigness as a part of collaborative care agreement.  Dr Lavera Guise Internal medicine

## 2020-12-19 LAB — CMP14+EGFR
ALT: 11 IU/L (ref 0–32)
AST: 12 IU/L (ref 0–40)
Albumin/Globulin Ratio: 1.5 (ref 1.2–2.2)
Albumin: 3.9 g/dL (ref 3.6–4.6)
Alkaline Phosphatase: 146 IU/L — ABNORMAL HIGH (ref 44–121)
BUN/Creatinine Ratio: 33 — ABNORMAL HIGH (ref 12–28)
BUN: 26 mg/dL (ref 8–27)
Bilirubin Total: 0.2 mg/dL (ref 0.0–1.2)
CO2: 19 mmol/L — ABNORMAL LOW (ref 20–29)
Calcium: 9.2 mg/dL (ref 8.7–10.3)
Chloride: 103 mmol/L (ref 96–106)
Creatinine, Ser: 0.78 mg/dL (ref 0.57–1.00)
Globulin, Total: 2.6 g/dL (ref 1.5–4.5)
Glucose: 124 mg/dL — ABNORMAL HIGH (ref 65–99)
Potassium: 4.4 mmol/L (ref 3.5–5.2)
Sodium: 142 mmol/L (ref 134–144)
Total Protein: 6.5 g/dL (ref 6.0–8.5)
eGFR: 76 mL/min/{1.73_m2} (ref >59)

## 2020-12-19 LAB — CBC WITH DIFFERENTIAL/PLATELET
Basophils Absolute: 0 10*3/uL (ref 0.0–0.2)
Basos: 0 %
EOS (ABSOLUTE): 0.1 10*3/uL (ref 0.0–0.4)
Eos: 1 %
Hematocrit: 36.5 % (ref 34.0–46.6)
Hemoglobin: 12.2 g/dL (ref 11.1–15.9)
Immature Grans (Abs): 0 10*3/uL (ref 0.0–0.1)
Immature Granulocytes: 0 %
Lymphocytes Absolute: 1.9 10*3/uL (ref 0.7–3.1)
Lymphs: 22 %
MCH: 28 pg (ref 26.6–33.0)
MCHC: 33.4 g/dL (ref 31.5–35.7)
MCV: 84 fL (ref 79–97)
Monocytes Absolute: 0.4 10*3/uL (ref 0.1–0.9)
Monocytes: 5 %
Neutrophils Absolute: 6 10*3/uL (ref 1.4–7.0)
Neutrophils: 72 %
Platelets: 235 10*3/uL (ref 150–450)
RBC: 4.36 x10E6/uL (ref 3.77–5.28)
RDW: 14.4 % (ref 11.7–15.4)
WBC: 8.5 10*3/uL (ref 3.4–10.8)

## 2020-12-19 LAB — LIPID PANEL
Chol/HDL Ratio: 3.5 ratio (ref 0.0–4.4)
Cholesterol, Total: 218 mg/dL — ABNORMAL HIGH (ref 100–199)
HDL: 63 mg/dL (ref >39)
LDL Chol Calc (NIH): 124 mg/dL — ABNORMAL HIGH (ref 0–99)
Triglycerides: 178 mg/dL — ABNORMAL HIGH (ref 0–149)
VLDL Cholesterol Cal: 31 mg/dL (ref 5–40)

## 2020-12-20 ENCOUNTER — Encounter: Payer: Self-pay | Admitting: Nurse Practitioner

## 2020-12-31 ENCOUNTER — Other Ambulatory Visit: Payer: Self-pay | Admitting: Nurse Practitioner

## 2020-12-31 DIAGNOSIS — F411 Generalized anxiety disorder: Secondary | ICD-10-CM

## 2020-12-31 DIAGNOSIS — M150 Primary generalized (osteo)arthritis: Secondary | ICD-10-CM

## 2021-01-02 ENCOUNTER — Telehealth: Payer: Self-pay

## 2021-01-02 DIAGNOSIS — M150 Primary generalized (osteo)arthritis: Secondary | ICD-10-CM

## 2021-01-02 DIAGNOSIS — F411 Generalized anxiety disorder: Secondary | ICD-10-CM

## 2021-01-03 MED ORDER — CLORAZEPATE DIPOTASSIUM 3.75 MG PO TABS: ORAL_TABLET | ORAL | 2 refills | Status: DC

## 2021-01-03 MED ORDER — TRAMADOL HCL 50 MG PO TABS: 50.0000 mg | ORAL_TABLET | Freq: Two times a day (BID) | ORAL | 2 refills | Status: DC | PRN

## 2021-01-03 NOTE — Telephone Encounter (Signed)
Prescriptions sent to pharmacy

## 2021-01-08 ENCOUNTER — Other Ambulatory Visit: Payer: Self-pay | Admitting: Internal Medicine

## 2021-01-08 DIAGNOSIS — F411 Generalized anxiety disorder: Secondary | ICD-10-CM

## 2021-01-09 ENCOUNTER — Other Ambulatory Visit: Payer: Self-pay

## 2021-01-18 ENCOUNTER — Ambulatory Visit: Payer: Medicare Other | Admitting: Nurse Practitioner

## 2021-01-29 ENCOUNTER — Ambulatory Visit: Payer: Medicare Other | Admitting: Nurse Practitioner

## 2021-02-19 ENCOUNTER — Other Ambulatory Visit: Payer: Self-pay | Admitting: Internal Medicine

## 2021-03-07 ENCOUNTER — Other Ambulatory Visit: Payer: Self-pay | Admitting: Internal Medicine

## 2021-03-07 DIAGNOSIS — F411 Generalized anxiety disorder: Secondary | ICD-10-CM

## 2021-03-21 ENCOUNTER — Other Ambulatory Visit: Payer: Self-pay | Admitting: Internal Medicine

## 2021-03-22 ENCOUNTER — Other Ambulatory Visit: Payer: Self-pay | Admitting: Internal Medicine

## 2021-05-02 ENCOUNTER — Other Ambulatory Visit: Payer: Self-pay | Admitting: Internal Medicine

## 2021-05-18 ENCOUNTER — Other Ambulatory Visit: Payer: Self-pay | Admitting: Internal Medicine

## 2021-06-24 ENCOUNTER — Ambulatory Visit: Payer: Medicare Other | Admitting: Nurse Practitioner

## 2021-07-16 ENCOUNTER — Encounter (INDEPENDENT_AMBULATORY_CARE_PROVIDER_SITE_OTHER): Payer: Self-pay

## 2021-07-16 ENCOUNTER — Other Ambulatory Visit: Payer: Self-pay

## 2021-07-16 ENCOUNTER — Encounter: Payer: Self-pay | Admitting: Nurse Practitioner

## 2021-07-16 ENCOUNTER — Ambulatory Visit (INDEPENDENT_AMBULATORY_CARE_PROVIDER_SITE_OTHER): Payer: Medicare Other | Admitting: Nurse Practitioner

## 2021-07-16 VITALS — BP 166/80 | HR 60 | Temp 98.4°F | Resp 16 | Ht 60.0 in

## 2021-07-16 DIAGNOSIS — Z0001 Encounter for general adult medical examination with abnormal findings: Secondary | ICD-10-CM | POA: Diagnosis not present

## 2021-07-16 DIAGNOSIS — R531 Weakness: Secondary | ICD-10-CM | POA: Diagnosis not present

## 2021-07-16 DIAGNOSIS — R7301 Impaired fasting glucose: Secondary | ICD-10-CM

## 2021-07-16 DIAGNOSIS — G20 Parkinson's disease: Secondary | ICD-10-CM | POA: Diagnosis not present

## 2021-07-16 DIAGNOSIS — E782 Mixed hyperlipidemia: Secondary | ICD-10-CM | POA: Diagnosis not present

## 2021-07-16 DIAGNOSIS — E1165 Type 2 diabetes mellitus with hyperglycemia: Secondary | ICD-10-CM | POA: Diagnosis not present

## 2021-07-16 DIAGNOSIS — E119 Type 2 diabetes mellitus without complications: Secondary | ICD-10-CM

## 2021-07-16 DIAGNOSIS — I10 Essential (primary) hypertension: Secondary | ICD-10-CM

## 2021-07-16 LAB — POCT GLYCOSYLATED HEMOGLOBIN (HGB A1C): Hemoglobin A1C: 7.1 % — AB (ref 4.0–5.6)

## 2021-07-16 NOTE — Progress Notes (Signed)
[facility] [place], [place] [address]  Internal MEDICINE  Office Visit Note  Patient Name: Jennifer Snyder  [id number]  [id number]  Date of Service: 07/19/2021  Chief Complaint  Patient presents with   Medicare Wellness    Discuss physical therapy, pt isn't moving around much    Diabetes   Hyperlipidemia   Hypertension    HPI Jennifer Snyder presents for a follow-up visit for diabetes, hypertension, hyperlipidemia, and patient may need physical therapy.  She is accompanied by her caregivers for the appointment today.  Her daughter and/or son did not attend the appointment today.  Patient was due to have her A1c checked today.  The caregivers stated that the patient is not diabetic.  The patient's prior A1c in May was 6.6 and the A1c in May 2021 was 7.0.  The patient was never started on any medication for diabetes.  The caregivers insisted that the patient needed to be weighed and that she had gained weight since the last time she was weighed.  The patient is unable to stand unassisted to be able to get an accurate weight.  She would benefit from physical therapy. Her blood pressure is elevated today but her caregivers attempted twice to get the patient to stand on the scale which caused the patient to have increased stress which caused her blood pressure to rise.  We will recheck blood pressure at next office visit.   Current Medication: Outpatient Encounter Medications as of 07/16/2021  Medication Sig   aspirin 81 MG chewable tablet Chew by mouth.   carbidopa-levodopa (SINEMET IR) 25-100 MG tablet Take 1 tablet by mouth 2 (two) times daily.    Carbidopa-Levodopa ER (SINEMET CR) 25-100 MG tablet controlled release Take 1 tablet by mouth at bedtime.    Cholecalciferol (VITAMIN D3 PO) Take by mouth daily.   clorazepate (TRANXENE) 3.75 MG tablet Take 1 tablet po 1 to 2 times daily as needed for anxiety.   clotrimazole-betamethasone (LOTRISONE) cream APPLY TO AFFECTED AREA  TWICE A DAY   furosemide (LASIX) 20 MG tablet TAKE 1 TABLET BY MOUTH EVERY OTHER DAY   gabapentin (NEURONTIN) 100 MG capsule TAKE 1 CAPSULE BY MOUTH TWICE DAILY AS NEEDED FOR LEG AND NERVE PAIN   methocarbamol (ROBAXIN) 750 MG tablet Take 1 tablet (750 mg total) by mouth every 6 (six) hours as needed for muscle spasms.   metoprolol succinate (TOPROL-XL) 25 MG 24 hr tablet START WITH 1/2 TABLET BY MOUTH ONCE A DAY AND MAY INCREASE TO ONE TABLET DAILY   mirtazapine (REMERON) 7.5 MG tablet TAKE 1 TABLET BY MOUTH EVERYDAY AT BEDTIME   oxybutynin (DITROPAN) 5 MG tablet TAKE 1 TABLET BY MOUTH EVERY DAY   pantoprazole (PROTONIX) 40 MG tablet TAKE 1 TABLET BY MOUTH EVERY DAY   polyethylene glycol (MIRALAX / GLYCOLAX) packet Take 17 g by mouth daily as needed for mild constipation.    sulfamethoxazole-trimethoprim (BACTRIM) 400-80 MG tablet Take 1 tablet by mouth 2 (two) times daily.   traMADol (ULTRAM) 50 MG tablet Take 1 tablet (50 mg total) by mouth every 12 (twelve) hours as needed for moderate pain or severe pain. for pain   No facility-administered encounter medications on file as of 07/16/2021.    Surgical History: Past Surgical History:  Procedure Laterality Date   ABDOMINAL HERNIA REPAIR     ADENOIDECTOMY  1994   CATARACT EXTRACTION, BILATERAL  09/2014, 07/2014   child birth  85, [place]  laser surgery on both eyes  11/2016   left knee replacement  1993   para esophogeal hernia repair     TONSILLECTOMY  1944   VAGINAL HYSTERECTOMY  1976    Medical History: Past Medical History:  Diagnosis Date   Diet-controlled type 2 diabetes mellitus (HCC)    Hyperlipidemia    Hypertension    Osteoarthritis    Parkinson's disease (HCC)    Peptic ulcer disease     Family History: Family History  Problem Relation Age of Onset   Lung cancer Mother    Hyperlipidemia Mother    Hypertension Mother    Osteoarthritis Mother    Stroke Mother     Social History    Socioeconomic History   Marital status: Single    Spouse name: Not on file   Number of children: Not on file   Years of education: Not on file   Highest education level: Not on file  Occupational History   Not on file  Tobacco Use   Smoking status: Never   Smokeless tobacco: Never  Substance and Sexual Activity   Alcohol use: No   Drug use: No   Sexual activity: Not on file  Other Topics Concern   Not on file  Social History Narrative   Not on file   Social Determinants of Health   Financial Resource Strain: Not on file  Food Insecurity: Not on file  Transportation Needs: Not on file  Physical Activity: Not on file  Stress: Not on file  Social Connections: Not on file  Intimate Partner Violence: Not on file      Review of Systems  Constitutional:  Negative for activity change, appetite change, chills, fatigue, fever and unexpected weight change.  HENT: Negative.  Negative for congestion, ear pain, rhinorrhea, sore throat and trouble swallowing.   Eyes: Negative.   Respiratory: Negative.  Negative for cough, chest tightness, shortness of breath and wheezing.   Cardiovascular: Negative.  Negative for chest pain and palpitations.  Gastrointestinal: Negative.  Negative for abdominal pain, blood in stool, constipation, diarrhea, nausea and vomiting.  Endocrine: Negative.   Genitourinary: Negative.  Negative for difficulty urinating, dysuria, frequency, hematuria and urgency.  Musculoskeletal:  Positive for arthralgias, back pain and gait problem. Negative for joint swelling, myalgias and neck pain.       Wheelchair bound  Skin: Negative.  Negative for rash and wound.  Allergic/Immunologic: Negative.  Negative for immunocompromised state.  Neurological:  Positive for tremors (parkinsons). Negative for dizziness, seizures, numbness and headaches.  Hematological: Negative.   Psychiatric/Behavioral: Negative.  Negative for behavioral problems, self-injury, sleep disturbance  and suicidal ideas. The patient is not nervous/anxious.    Vital Signs: BP (!) 166/80    Pulse 60 Comment: 44   Temp 98.4 F (36.9 C)    Resp 16    Ht 5' (1.524 m)    SpO2 97%    BMI 29.29 kg/m    Physical Exam Vitals reviewed.  Constitutional:      General: She is not in acute distress.    Appearance: Normal appearance. She is not ill-appearing.  HENT:     Head: Normocephalic and atraumatic.     Right Ear: Tympanic membrane, ear canal and external ear normal.     Left Ear: Tympanic membrane, ear canal and external ear normal.     Nose: Nose normal. No congestion or rhinorrhea.     Mouth/Throat:     Mouth: Mucous membranes are moist.  Pharynx: Oropharynx is clear. No oropharyngeal exudate or posterior oropharyngeal erythema.  Eyes:     Extraocular Movements: Extraocular movements intact.     Conjunctiva/sclera: Conjunctivae normal.     Pupils: Pupils are equal, round, and reactive to light.  Cardiovascular:     Rate and Rhythm: Normal rate and regular rhythm.     Pulses: Normal pulses.     Heart sounds: Normal heart sounds. No murmur heard. Pulmonary:     Effort: Pulmonary effort is normal. No respiratory distress.     Breath sounds: Normal breath sounds. No wheezing.  Abdominal:     Palpations: Abdomen is soft.  Musculoskeletal:     Cervical back: Normal range of motion and neck supple.  Lymphadenopathy:     Cervical: No cervical adenopathy.  Skin:    General: Skin is warm and dry.     Capillary Refill: Capillary refill takes less than 2 seconds.  Neurological:     Mental Status: She is alert and oriented to person, place, and time.     Motor: Weakness present.     Gait: Gait abnormal.  Psychiatric:        Mood and Affect: Mood normal.        Behavior: Behavior normal.       Assessment/Plan: 1. Encounter for general adult medical examination with abnormal findings Age-appropriate preventive screenings and vaccinations discussed, annual physical exam  completed. Routine labs for health maintenance not needed at this time. PHM updated.   2. Type 2 diabetes mellitus with hyperglycemia, without long-term current use of insulin (HCC) A1C is now 7.1 recommended patient start on medication for diabetes, patient's caregivers said that this should be discussed with the patient's son or daughter. Accu-chek glucose meter provided to patient in office, lancets and test strips ordered.  - POCT glycosylated hemoglobin (Hb A1C)  3. Mixed hyperlipidemia Stable, not on any medication at this time.   4. Parkinson disease ([place]) Patient needs referral to home health for nursing and physical therapy for parkinson's and generalized weakness. Patient has difficulty standing up and transferring out of and into wheelchair.  - Ambulatory referral to [place]  5. Generalized weakness See problem #4 - Ambulatory referral to [place]  6. Essential hypertension Blood pressure is elevated due to attempting to get patient to stand on scale twice, caregivers will continue to monitor blood pressure at home and will reevaluate at follow up visit.    General Counseling: Isabella verbalizes understanding of the findings of todays visit and agrees with plan of treatment. I have discussed any further diagnostic evaluation that may be needed or ordered today. We also reviewed her medications today. she has been encouraged to call the office with any questions or concerns that should arise related to todays visit.    Orders Placed This Encounter  Procedures   Ambulatory referral to [place]   POCT glycosylated hemoglobin (Hb A1C)    No orders of the defined types were placed in this encounter.   Return in about 1 month (around 08/16/2021) for F/U, Buel Molder PCP diabetes.   Total time spent:30 Minutes Time spent includes review of chart, medications, test results, and follow up plan with the patient.   [place] Controlled Substance Database was reviewed by me.  This  patient was seen by Jonetta Osgood, FNP-C in collaboration with Dr. Clayborn Bigness as a part of collaborative care agreement.   Meira Wahba R. Valetta Fuller, MSN, FNP-C Internal medicine

## 2021-07-19 ENCOUNTER — Encounter: Payer: Self-pay | Admitting: Nurse Practitioner

## 2021-07-19 ENCOUNTER — Other Ambulatory Visit: Payer: Self-pay

## 2021-07-19 ENCOUNTER — Telehealth: Payer: Self-pay | Admitting: Nurse Practitioner

## 2021-07-19 ENCOUNTER — Telehealth: Payer: Self-pay

## 2021-07-19 DIAGNOSIS — M810 Age-related osteoporosis without current pathological fracture: Secondary | ICD-10-CM | POA: Insufficient documentation

## 2021-07-19 MED ORDER — ACCU-CHEK SOFTCLIX LANCETS MISC: 1 refills | Status: DC

## 2021-07-19 MED ORDER — ACCU-CHEK GUIDE VI STRP: 1.0000 | ORAL_STRIP | Freq: Two times a day (BID) | 1 refills | Status: DC

## 2021-07-19 NOTE — Telephone Encounter (Signed)
Jennifer Snyder the patients care giver called stating that the provider had stated they would call the patients daughter/son to inform them of what all is going on with the patient and they stated that hasn't been done and would like that to be done. They also stated diabetes medication was discussed at the appt and test strips and neither have been sent according to the care giver. She stated if the nurse or provider could contact the son at [phone] Jennifer Snyder is his name. I stated I would let the nurse and provider know and they will contact the family member.

## 2021-07-19 NOTE — Telephone Encounter (Signed)
Caregivers that accompanied patient to her office visit on 07/16/21 requested that the patient's son or daughter be called to discuss the diagnosis of diabetes and possible treatment options prior to any medication being prescribed. Daughter and son were both called and voicemail messages were left for them to call the clinic to speak with provider regarding the details of their mother's most recent office visit.

## 2021-07-19 NOTE — Telephone Encounter (Signed)
Send strips and lancets

## 2021-07-19 NOTE — Telephone Encounter (Signed)
Spoke with pt caregiver she like to what next steps that you going to put her med and also they like you to call his son

## 2021-07-21 ENCOUNTER — Other Ambulatory Visit: Payer: Self-pay | Admitting: Internal Medicine

## 2021-07-22 ENCOUNTER — Telehealth: Payer: Self-pay | Admitting: Nurse Practitioner

## 2021-07-22 ENCOUNTER — Other Ambulatory Visit: Payer: Self-pay | Admitting: Nurse Practitioner

## 2021-07-22 DIAGNOSIS — E1165 Type 2 diabetes mellitus with hyperglycemia: Secondary | ICD-10-CM

## 2021-07-22 MED ORDER — RYBELSUS 3 MG PO TABS: 3.0000 mg | ORAL_TABLET | Freq: Every day | ORAL | 3 refills | Status: DC

## 2021-07-22 NOTE — Telephone Encounter (Signed)
Called and spoke to patient's son Korra Christine. Discussed diabetes diagnosis and adding rybelsus to medication regimen. Patient's son is agreeable to plan and will repeat A1C in 3 months.   1. Type 2 diabetes mellitus with hyperglycemia, without long-term current use of insulin (HCC) Discussed with son - Semaglutide (RYBELSUS) 3 MG TABS; Take 3 mg by mouth daily before breakfast. Take medication on empty stomach with no more than 4 oz of water and wait 30 minutes before eating, drinking or taking any other medications.  Dispense: 30 tablet; Refill: 3

## 2021-07-23 ENCOUNTER — Telehealth: Payer: Self-pay

## 2021-07-23 NOTE — Telephone Encounter (Signed)
Sent request to [facility] and notified Monia Pouch to review for Home Health nursing, PT and OT.  He will get back to [place] and let us know if pt is qualified

## 2021-07-24 ENCOUNTER — Telehealth: Payer: Self-pay

## 2021-07-24 NOTE — Telephone Encounter (Signed)
Spoke to Isabel from [facility] and they are not able to take pt due to staffing issues.  I called [facility] and LMOM asking for someone to call us back to check if they can get accept pt.

## 2021-07-25 ENCOUNTER — Telehealth: Payer: Self-pay

## 2021-07-25 ENCOUNTER — Other Ambulatory Visit: Payer: Self-pay | Admitting: Nurse Practitioner

## 2021-07-25 DIAGNOSIS — M150 Primary generalized (osteo)arthritis: Secondary | ICD-10-CM

## 2021-07-25 MED ORDER — METOPROLOL SUCCINATE ER 25 MG PO TB24: 25.0000 mg | ORAL_TABLET | Freq: Every day | ORAL | 3 refills | Status: DC

## 2021-07-25 MED ORDER — TRAMADOL HCL 50 MG PO TABS: 50.0000 mg | ORAL_TABLET | Freq: Two times a day (BID) | ORAL | 2 refills | Status: DC | PRN

## 2021-07-25 NOTE — Telephone Encounter (Signed)
Spoke with caregiver and advised we are in the process of getting pt set up with [facility] if they are able to take pt on for home health nursing, PT and OT.  Advised it may be after the holidays due to them having staffing shortages

## 2021-07-25 NOTE — Telephone Encounter (Signed)
Med reordered and the instructions were fixed

## 2021-07-25 NOTE — Telephone Encounter (Signed)
Spoke to [place] at  [facility] and was advised to fax referral for home health nursing, PT and OT and they will let us know if they can help.  She advised that it may be 2-3 weeks out for PT but they can start on other.  I faxed the referral and pt demographics to [phone]

## 2021-07-25 NOTE — Telephone Encounter (Signed)
Pt caregiver cheryl advised that we send med to [facility]

## 2021-07-30 ENCOUNTER — Telehealth: Payer: Self-pay

## 2021-07-30 NOTE — Telephone Encounter (Signed)
Sent (faxed) referral for home health to [facility] to eval and treat for nursing , PT and OT.  Faxed [phone] at 11:34 am

## 2021-07-31 ENCOUNTER — Telehealth: Payer: Self-pay

## 2021-07-31 NOTE — Telephone Encounter (Signed)
Spoke with pt caregiver that we working on her home health that some they are not taking her insurance and some don't have staffing we will try on next Tuesday and see if they take her insurance we let her know

## 2021-08-01 ENCOUNTER — Telehealth: Payer: Self-pay

## 2021-08-01 NOTE — Telephone Encounter (Signed)
Spoke  with pt caregiver  that [facility] will take her and they will go today for evaluation

## 2021-08-02 ENCOUNTER — Telehealth: Payer: Self-pay

## 2021-08-02 NOTE — Telephone Encounter (Signed)
Gave verbal to [facility] for PT(brianne [phone]) 2 twice a week  and once a week for 5 week

## 2021-08-03 ENCOUNTER — Telehealth: Payer: Self-pay

## 2021-08-03 NOTE — Telephone Encounter (Signed)
08/02/21 gave verbal order to Berniece Andreas [phone] with [facility] for OT 1 time a week for 7 weeks

## 2021-08-07 ENCOUNTER — Telehealth: Payer: Self-pay

## 2021-08-07 ENCOUNTER — Other Ambulatory Visit: Payer: Self-pay | Admitting: Internal Medicine

## 2021-08-07 NOTE — Telephone Encounter (Signed)
Jennifer Snyder, [phone], called for verbal orders, pt has stage 2 pressure sore and needs Aquacel dressing 2 times per week for 4 weeks

## 2021-08-12 ENCOUNTER — Telehealth: Payer: Self-pay

## 2021-08-12 ENCOUNTER — Other Ambulatory Visit: Payer: Self-pay | Admitting: Nurse Practitioner

## 2021-08-12 DIAGNOSIS — F411 Generalized anxiety disorder: Secondary | ICD-10-CM

## 2021-08-15 ENCOUNTER — Telehealth: Payer: Self-pay

## 2021-08-15 ENCOUNTER — Other Ambulatory Visit: Payer: Self-pay | Admitting: Nurse Practitioner

## 2021-08-15 DIAGNOSIS — F411 Generalized anxiety disorder: Secondary | ICD-10-CM

## 2021-08-15 MED ORDER — CLORAZEPATE DIPOTASSIUM 3.75 MG PO TABS: 3.7500 mg | ORAL_TABLET | Freq: Two times a day (BID) | ORAL | 1 refills | Status: DC | PRN

## 2021-08-15 NOTE — Telephone Encounter (Signed)
error 

## 2021-08-15 NOTE — Telephone Encounter (Signed)
Med sent to pharmacy.

## 2021-08-23 ENCOUNTER — Other Ambulatory Visit: Payer: Self-pay | Admitting: Internal Medicine

## 2021-08-23 ENCOUNTER — Other Ambulatory Visit: Payer: Self-pay | Admitting: Nurse Practitioner

## 2021-09-02 ENCOUNTER — Telehealth: Payer: Self-pay

## 2021-09-02 NOTE — Telephone Encounter (Signed)
Home health plan of care signed by Alyssa and faxed back to [facility] at [phone].

## 2021-09-05 ENCOUNTER — Telehealth: Payer: Medicare Other | Admitting: Physician Assistant

## 2021-09-05 VITALS — Resp 16

## 2021-09-05 DIAGNOSIS — J069 Acute upper respiratory infection, unspecified: Secondary | ICD-10-CM | POA: Diagnosis not present

## 2021-09-05 MED ORDER — AZITHROMYCIN 250 MG PO TABS: ORAL_TABLET | ORAL | 0 refills | Status: DC

## 2021-09-05 NOTE — Progress Notes (Signed)
[facility] [address] [place], [place] [address]  Internal MEDICINE  Telephone Visit  Patient Name: Jennifer Snyder  [id number]  [id number]  Date of Service: 09/08/2021  I connected with the patient at 3:28 by telephone and verified the patients identity using two identifiers.   I discussed the limitations, risks, security and privacy concerns of performing an evaluation and management service by telephone and the availability of in person appointments. I also discussed with the patient that there may be a patient responsible charge related to the service.  The patient expressed understanding and agrees to proceed.    Chief Complaint  Patient presents with   Telephone Screen   Telephone Assessment   Cough    Persistent cough     HPI Pt is here for virtual sick visit -caregiver stats she took one covid test that was positive, but then took a second test which was negative -she has had a productive cough since midday today, but otherwise states she feels fine -no fever, fatigue, body chills or aches, SOB, or wheezing, but has had some ear pressure -Discussed that it is possible that the second test was false negative and that she should quarantine as if she is covid positive and rest  Current Medication: Outpatient Encounter Medications as of 09/05/2021  Medication Sig   Accu-Chek Softclix Lancets lancets Use as instructed twice a daily DX E11.65   aspirin 81 MG chewable tablet Chew by mouth.   azithromycin (ZITHROMAX) 250 MG tablet Take one tab a day for 10 days for uri   carbidopa-levodopa (SINEMET IR) 25-100 MG tablet Take 1 tablet by mouth 2 (two) times daily.    Carbidopa-Levodopa ER (SINEMET CR) 25-100 MG tablet controlled release Take 1 tablet by mouth at bedtime.    Cholecalciferol (VITAMIN D3 PO) Take by mouth daily.   clorazepate (TRANXENE) 3.75 MG tablet Take 1 tablet (3.75 mg total) by mouth 2 (two) times daily as needed for anxiety.    clotrimazole-betamethasone (LOTRISONE) cream APPLY TO AFFECTED AREA TWICE A DAY   furosemide (LASIX) 20 MG tablet TAKE 1 TABLET BY MOUTH EVERY OTHER DAY   gabapentin (NEURONTIN) 100 MG capsule TAKE 1 CAPSULE BY MOUTH TWICE DAILY AS NEEDED FOR LEG AND NERVE PAIN   glucose blood (ACCU-CHEK GUIDE) test strip 1 each by Other route in the morning and at bedtime. Use as instructed twice a day Dx e11.65   methocarbamol (ROBAXIN) 750 MG tablet Take 1 tablet (750 mg total) by mouth every 6 (six) hours as needed for muscle spasms.   metoprolol succinate (TOPROL-XL) 25 MG 24 hr tablet Take 1 tablet (25 mg total) by mouth daily. START WITH 1/2 TABLET BY MOUTH ONCE A DAY THEN INCREASE after 7 days if BP still elevated >140/90   mirtazapine (REMERON) 7.5 MG tablet TAKE 1 TABLET BY MOUTH EVERYDAY AT BEDTIME   oxybutynin (DITROPAN) 5 MG tablet TAKE 1 TABLET BY MOUTH EVERY DAY   pantoprazole (PROTONIX) 40 MG tablet TAKE 1 TABLET BY MOUTH EVERY DAY   polyethylene glycol (MIRALAX / GLYCOLAX) packet Take 17 g by mouth daily as needed for mild constipation.    Semaglutide (RYBELSUS) 3 MG TABS Take 3 mg by mouth daily before breakfast. Take medication on empty stomach with no more than 4 oz of water and wait 30 minutes before eating, drinking or taking any other medications.   sulfamethoxazole-trimethoprim (BACTRIM) 400-80 MG tablet Take 1 tablet by mouth 2 (two) times daily.   traMADol (ULTRAM) 50  MG tablet Take 1 tablet (50 mg total) by mouth every 12 (twelve) hours as needed for moderate pain or severe pain. for pain   No facility-administered encounter medications on file as of 09/05/2021.    Surgical History: Past Surgical History:  Procedure Laterality Date   ABDOMINAL HERNIA REPAIR     ADENOIDECTOMY  1994   CATARACT EXTRACTION, BILATERAL  09/2014, 07/2014   child birth  [birth year], 12   CHOLECYSTECTOMY  1989   laser surgery on both eyes  11/2016   left knee replacement  1993   para esophogeal hernia repair      TONSILLECTOMY  1944   VAGINAL HYSTERECTOMY  1976    Medical History: Past Medical History:  Diagnosis Date   Diet-controlled type 2 diabetes mellitus (HCC)    Hyperlipidemia    Hypertension    Osteoarthritis    Parkinson's disease (HCC)    Peptic ulcer disease     Family History: Family History  Problem Relation Age of Onset   Lung cancer Mother    Hyperlipidemia Mother    Hypertension Mother    Osteoarthritis Mother    Stroke Mother     Social History   Socioeconomic History   Marital status: Single    Spouse name: Not on file   Number of children: Not on file   Years of education: Not on file   Highest education level: Not on file  Occupational History   Not on file  Tobacco Use   Smoking status: Never   Smokeless tobacco: Never  Substance and Sexual Activity   Alcohol use: No   Drug use: No   Sexual activity: Not on file  Other Topics Concern   Not on file  Social History Narrative   Not on file   Social Determinants of Health   Financial Resource Strain: Not on file  Food Insecurity: Not on file  Transportation Needs: Not on file  Physical Activity: Not on file  Stress: Not on file  Social Connections: Not on file  Intimate Partner Violence: Not on file      Review of Systems  Constitutional:  Negative for fatigue and fever.  HENT:  Positive for ear pain. Negative for congestion, mouth sores and postnasal drip.   Respiratory:  Positive for cough. Negative for shortness of breath and wheezing.   Cardiovascular:  Negative for chest pain.  Genitourinary:  Negative for flank pain.  Neurological:  Negative for headaches.  Psychiatric/Behavioral: Negative.     Vital Signs: Resp 16    Observation/Objective:  Pt is able to carry out conversation, though caretaker provides most information   Assessment/Plan: 1. Upper respiratory tract infection, unspecified type Will treat with zpak since unclear whether covid or unspecified URI, will stay  well hydrated and rest - azithromycin (ZITHROMAX) 250 MG tablet; Take one tab a day for 10 days for uri  Dispense: 10 tablet; Refill: 0   General Counseling: Yalonda verbalizes understanding of the findings of today's phone visit and agrees with plan of treatment. I have discussed any further diagnostic evaluation that may be needed or ordered today. We also reviewed her medications today. she has been encouraged to call the office with any questions or concerns that should arise related to todays visit.    No orders of the defined types were placed in this encounter.   Meds ordered this encounter  Medications   azithromycin (ZITHROMAX) 250 MG tablet    Sig: Take one tab a day for 10 days  for uri    Dispense:  10 tablet    Refill:  0    Time spent:25 Minutes    Dr Lyndon CodeFozia M Khan Internal medicine

## 2021-09-16 ENCOUNTER — Telehealth: Payer: Self-pay

## 2021-09-16 NOTE — Telephone Encounter (Signed)
Physical therapist cameron called back pulse 70 to 75 and also advised that let caregiver keep reading and let us know if any changes

## 2021-09-24 ENCOUNTER — Telehealth: Payer: Self-pay

## 2021-09-24 NOTE — Telephone Encounter (Signed)
Jennifer Snyder from [facility] requesting verbal order for: OT 1 x week for 3 weeks I called back and LMOM approving verbal order for OT.  Advised to call back if any questions

## 2021-10-03 ENCOUNTER — Telehealth: Payer: Self-pay

## 2021-10-03 NOTE — Telephone Encounter (Signed)
OT order signed and faxed back to [facility] at [phone]. ?

## 2021-10-15 ENCOUNTER — Other Ambulatory Visit: Payer: Self-pay

## 2021-10-15 ENCOUNTER — Ambulatory Visit: Payer: Medicare Other | Admitting: Nurse Practitioner

## 2021-10-15 ENCOUNTER — Encounter: Payer: Self-pay | Admitting: Nurse Practitioner

## 2021-10-15 VITALS — BP 150/85 | HR 70 | Temp 98.2°F | Resp 16 | Ht 60.0 in | Wt 155.0 lb

## 2021-10-15 DIAGNOSIS — I10 Essential (primary) hypertension: Secondary | ICD-10-CM

## 2021-10-15 DIAGNOSIS — E1165 Type 2 diabetes mellitus with hyperglycemia: Secondary | ICD-10-CM | POA: Diagnosis not present

## 2021-10-15 DIAGNOSIS — R531 Weakness: Secondary | ICD-10-CM

## 2021-10-15 DIAGNOSIS — G20 Parkinson's disease: Secondary | ICD-10-CM

## 2021-10-15 DIAGNOSIS — F411 Generalized anxiety disorder: Secondary | ICD-10-CM

## 2021-10-15 LAB — POCT GLYCOSYLATED HEMOGLOBIN (HGB A1C): Hemoglobin A1C: 6.6 % — AB (ref 4.0–5.6)

## 2021-10-15 MED ORDER — METOPROLOL SUCCINATE ER 25 MG PO TB24: 25.0000 mg | ORAL_TABLET | Freq: Every day | ORAL | 3 refills | Status: DC

## 2021-10-15 MED ORDER — ACCU-CHEK GUIDE VI STRP: 1.0000 | ORAL_STRIP | Freq: Two times a day (BID) | 1 refills | Status: DC

## 2021-10-15 MED ORDER — CLORAZEPATE DIPOTASSIUM 3.75 MG PO TABS: 3.7500 mg | ORAL_TABLET | Freq: Two times a day (BID) | ORAL | 1 refills | Status: DC | PRN

## 2021-10-15 MED ORDER — RYBELSUS 3 MG PO TABS: 3.0000 mg | ORAL_TABLET | Freq: Every day | ORAL | 3 refills | Status: DC

## 2021-10-15 MED ORDER — ACCU-CHEK SOFTCLIX LANCETS MISC: 1 refills | Status: DC

## 2021-10-15 NOTE — Progress Notes (Signed)
[facility] ?[address] ?[place], [place] [address] ? ?Internal MEDICINE  ?Office Visit Note ? ?Patient Name: Jennifer Snyder ? [id number]  ?[id number] ? ?Date of Service: 10/15/2021 ? ?Chief Complaint  ?Patient presents with  ? Follow-up  ? Diabetes  ? Hyperlipidemia  ? Hypertension  ? ? ?HPI ?Jennifer Snyder presents for a follow-up visit for diabetes and hypertension.  At her previous office visit her A1c was 7.1 and she was started on Rybelsus 3 mg daily which has been helping with her A1c.  Her A1c was 6.6 today which is significantly improved from 7.1 in December.  Her glucose levels have been ranging from 100-140 at home.  She is accompanied by her caregiver today to the office visit.  The caregiver reports that it is the patient's nap time usually at this time of day and patient is slightly irritable.  Blood pressure is little bit high when rechecked but her blood pressure at home is better. ?Patient and the caregiver have no other questions or concerns today. ? ? ? ?Current Medication: ?Outpatient Encounter Medications as of 10/15/2021  ?Medication Sig  ? aspirin 81 MG chewable tablet Chew by mouth.  ? carbidopa-levodopa (SINEMET IR) 25-100 MG tablet Take 1 tablet by mouth 2 (two) times daily.   ? Carbidopa-Levodopa ER (SINEMET CR) 25-100 MG tablet controlled release Take 1 tablet by mouth at bedtime.   ? Cholecalciferol (VITAMIN D3 PO) Take by mouth daily.  ? clotrimazole-betamethasone (LOTRISONE) cream APPLY TO AFFECTED AREA TWICE A DAY  ? furosemide (LASIX) 20 MG tablet TAKE 1 TABLET BY MOUTH EVERY OTHER DAY  ? gabapentin (NEURONTIN) 100 MG capsule TAKE 1 CAPSULE BY MOUTH TWICE DAILY AS NEEDED FOR LEG AND NERVE PAIN  ? mirtazapine (REMERON) 7.5 MG tablet TAKE 1 TABLET BY MOUTH EVERYDAY AT BEDTIME  ? oxybutynin (DITROPAN) 5 MG tablet TAKE 1 TABLET BY MOUTH EVERY DAY  ? pantoprazole (PROTONIX) 40 MG tablet TAKE 1 TABLET BY MOUTH EVERY DAY  ? polyethylene glycol (MIRALAX / GLYCOLAX) packet Take 17 g by mouth  daily as needed for mild constipation.   ? traMADol (ULTRAM) 50 MG tablet Take 1 tablet (50 mg total) by mouth every 12 (twelve) hours as needed for moderate pain or severe pain. for pain  ? [DISCONTINUED] Accu-Chek Softclix Lancets lancets Use as instructed twice a daily DX E11.65  ? [DISCONTINUED] azithromycin (ZITHROMAX) 250 MG tablet Take one tab a day for 10 days for uri  ? [DISCONTINUED] clorazepate (TRANXENE) 3.75 MG tablet Take 1 tablet (3.75 mg total) by mouth 2 (two) times daily as needed for anxiety.  ? [DISCONTINUED] glucose blood (ACCU-CHEK GUIDE) test strip 1 each by Other route in the morning and at bedtime. Use as instructed twice a day Dx e11.65  ? [DISCONTINUED] methocarbamol (ROBAXIN) 750 MG tablet Take 1 tablet (750 mg total) by mouth every 6 (six) hours as needed for muscle spasms.  ? [DISCONTINUED] metoprolol succinate (TOPROL-XL) 25 MG 24 hr tablet Take 1 tablet (25 mg total) by mouth daily. START WITH 1/2 TABLET BY MOUTH ONCE A DAY THEN INCREASE after 7 days if BP still elevated >140/90  ? [DISCONTINUED] Semaglutide (RYBELSUS) 3 MG TABS Take 3 mg by mouth daily before breakfast. Take medication on empty stomach with no more than 4 oz of water and wait 30 minutes before eating, drinking or taking any other medications.  ? [DISCONTINUED] sulfamethoxazole-trimethoprim (BACTRIM) 400-80 MG tablet Take 1 tablet by mouth 2 (two) times daily.  ? Accu-Chek  Softclix Lancets lancets Use as instructed twice a daily DX E11.65  ? clorazepate (TRANXENE) 3.75 MG tablet Take 1 tablet (3.75 mg total) by mouth 2 (two) times daily as needed for anxiety.  ? glucose blood (ACCU-CHEK GUIDE) test strip 1 each by Other route in the morning and at bedtime. Use as instructed twice a day Dx e11.65  ? metoprolol succinate (TOPROL-XL) 25 MG 24 hr tablet Take 1 tablet (25 mg total) by mouth daily. START WITH 1/2 TABLET BY MOUTH ONCE A DAY THEN INCREASE after 7 days if BP still elevated >140/90  ? mirtazapine (REMERON) 15  MG tablet Take 15 mg by mouth at bedtime.  ? Semaglutide (RYBELSUS) 3 MG TABS Take 3 mg by mouth daily before breakfast. Take medication on empty stomach with no more than 4 oz of water and wait 30 minutes before eating, drinking or taking any other medications.  ? [DISCONTINUED] polyethylene glycol powder (GLYCOLAX/MIRALAX) 17 GM/SCOOP powder Take by mouth.  ? ?No facility-administered encounter medications on file as of 10/15/2021.  ? ? ?Surgical History: ?Past Surgical History:  ?Procedure Laterality Date  ? ABDOMINAL HERNIA REPAIR    ? ADENOIDECTOMY  1994  ? CATARACT EXTRACTION, BILATERAL  09/2014, 07/2014  ? child birth  [place]  ? CHOLECYSTECTOMY  1989  ? laser surgery on both eyes  11/2016  ? left knee replacement  1993  ? para esophogeal hernia repair    ? TONSILLECTOMY  1944  ? VAGINAL HYSTERECTOMY  1976  ? ? ?Medical History: ?Past Medical History:  ?Diagnosis Date  ? Diet-controlled type 2 diabetes mellitus ([place])   ? Hyperlipidemia   ? Hypertension   ? Osteoarthritis   ? Parkinson's disease ([place])   ? Peptic ulcer disease   ? ? ?Family History: ?Family History  ?Problem Relation Age of Onset  ? Lung cancer Mother   ? Hyperlipidemia Mother   ? Hypertension Mother   ? Osteoarthritis Mother   ? Stroke Mother   ? ? ?Social History  ? ?Socioeconomic History  ? Marital status: Single  ?  Spouse name: Not on file  ? Number of children: Not on file  ? Years of education: Not on file  ? Highest education level: Not on file  ?Occupational History  ? Not on file  ?Tobacco Use  ? Smoking status: Never  ? Smokeless tobacco: Never  ?Substance and Sexual Activity  ? Alcohol use: No  ? Drug use: No  ? Sexual activity: Not on file  ?Other Topics Concern  ? Not on file  ?Social History Narrative  ? Not on file  ? ?Social Determinants of Health  ? ?Financial Resource Strain: Not on file  ?Food Insecurity: Not on file  ?Transportation Needs: Not on file  ?Physical Activity: Not on file  ?Stress: Not on file  ?Social  Connections: Not on file  ?Intimate Partner Violence: Not on file  ? ? ? ? ?Review of Systems  ?Constitutional:  Negative for chills, fatigue and unexpected weight change.  ?HENT:  Negative for congestion, rhinorrhea, sneezing and sore throat.   ?Eyes:  Negative for redness.  ?Respiratory:  Negative for cough, chest tightness and shortness of breath.   ?Cardiovascular: Negative.  Negative for chest pain and palpitations.  ?Gastrointestinal: Negative.  Negative for abdominal pain, constipation, diarrhea, nausea and vomiting.  ?Genitourinary:  Negative for dysuria and frequency.  ?Musculoskeletal:  Positive for arthralgias and gait problem (ambulates in wheelchair with assistance, working with physical therapy.). Negative for back  pain, joint swelling and neck pain.  ?Skin: Negative.  Negative for rash.  ?Neurological:  Negative for tremors and numbness.  ?Hematological:  Negative for adenopathy. Does not bruise/bleed easily.  ?Psychiatric/Behavioral:  Negative for behavioral problems (Depression), sleep disturbance and suicidal ideas. The patient is not nervous/anxious.   ? ?Vital Signs: ?BP (!) 179/94   Pulse 70   Temp 98.2 ?F (36.8 ?C)   Resp 16   Ht 5' (1.524 m)   Wt 155 lb (70.3 kg)   SpO2 97%   BMI 30.27 kg/m?  ? ? ?Physical Exam ?Vitals reviewed.  ?Constitutional:   ?   General: She is not in acute distress. ?   Appearance: Normal appearance. She is obese. She is not ill-appearing.  ?HENT:  ?   Head: Normocephalic and atraumatic.  ?Eyes:  ?   Pupils: Pupils are equal, round, and reactive to light.  ?Cardiovascular:  ?   Rate and Rhythm: Normal rate and regular rhythm.  ?Pulmonary:  ?   Effort: Pulmonary effort is normal. No respiratory distress.  ?Neurological:  ?   Mental Status: She is alert and oriented to person, place, and time.  ?   Cranial Nerves: No cranial nerve deficit.  ?   Motor: Weakness present.  ?   Gait: Gait abnormal.  ?Psychiatric:     ?   Mood and Affect: Mood normal.     ?   Behavior:  Behavior normal.  ? ? ? ? ? ?Assessment/Plan: ?1. Type 2 diabetes mellitus with hyperglycemia, without long-term current use of insulin ([place]) ?A1c improved to 6.6, continue Rybelsus 3 mg daily.  Repeat A1c in 3 mon

## 2021-10-18 ENCOUNTER — Telehealth: Payer: Self-pay

## 2021-10-18 NOTE — Telephone Encounter (Signed)
Home health plan of care signed by provider and faxed back to [facility] at [phone]. Plan of care placed in home health folder at the front. ?

## 2021-10-23 ENCOUNTER — Telehealth: Payer: Self-pay

## 2021-10-23 MED ORDER — NITROFURANTOIN MONOHYD MACRO 100 MG PO CAPS: 100.0000 mg | ORAL_CAPSULE | Freq: Two times a day (BID) | ORAL | 0 refills | Status: DC

## 2021-10-23 NOTE — Telephone Encounter (Signed)
Per Alyssa we sent in Macrobid 100 mg twice a day for 7 days ?

## 2021-11-01 ENCOUNTER — Other Ambulatory Visit: Payer: Self-pay | Admitting: Nurse Practitioner

## 2021-12-16 ENCOUNTER — Other Ambulatory Visit: Payer: Self-pay | Admitting: Nurse Practitioner

## 2022-01-14 ENCOUNTER — Encounter: Payer: Self-pay | Admitting: Nurse Practitioner

## 2022-01-14 ENCOUNTER — Ambulatory Visit: Payer: Medicare Other | Admitting: Nurse Practitioner

## 2022-01-14 VITALS — BP 140/80 | HR 60 | Temp 98.3°F | Resp 16 | Ht 60.0 in | Wt 160.0 lb

## 2022-01-14 DIAGNOSIS — E1165 Type 2 diabetes mellitus with hyperglycemia: Secondary | ICD-10-CM | POA: Diagnosis not present

## 2022-01-14 DIAGNOSIS — M150 Primary generalized (osteo)arthritis: Secondary | ICD-10-CM

## 2022-01-14 DIAGNOSIS — G20 Parkinson's disease: Secondary | ICD-10-CM

## 2022-01-14 DIAGNOSIS — E538 Deficiency of other specified B group vitamins: Secondary | ICD-10-CM | POA: Diagnosis not present

## 2022-01-14 DIAGNOSIS — F411 Generalized anxiety disorder: Secondary | ICD-10-CM

## 2022-01-14 LAB — POCT GLYCOSYLATED HEMOGLOBIN (HGB A1C): Hemoglobin A1C: 6.7 % — AB (ref 4.0–5.6)

## 2022-01-14 MED ORDER — CLORAZEPATE DIPOTASSIUM 3.75 MG PO TABS: 3.7500 mg | ORAL_TABLET | Freq: Two times a day (BID) | ORAL | 1 refills | Status: DC | PRN

## 2022-01-14 MED ORDER — CYANOCOBALAMIN 1000 MCG/ML IJ SOLN
1000.0000 ug | Freq: Once | INTRAMUSCULAR | Status: AC
  Administered 2022-01-14: 1000 ug via INTRAMUSCULAR

## 2022-01-14 MED ORDER — TRAMADOL HCL 50 MG PO TABS: 50.0000 mg | ORAL_TABLET | Freq: Two times a day (BID) | ORAL | 2 refills | Status: DC | PRN

## 2022-01-14 MED ORDER — RYBELSUS 3 MG PO TABS: 3.0000 mg | ORAL_TABLET | Freq: Every day | ORAL | 3 refills | Status: DC

## 2022-01-14 NOTE — Progress Notes (Signed)
[facility] [address] [place], [place] [address]  Internal MEDICINE  Office Visit Note  Patient Name: Jennifer RiseDoris R Decesare  [id number]  [id number]  Date of Service: 01/14/2022  Chief Complaint  Patient presents with   Diabetes   Hyperlipidemia   Hypertension   Follow-up   Fatigue    Low energy     HPI Tyler AasDoris presents for follow-up visit for fatigue, low energy, diabetes and hypertension.  She presents today with her caregiver and it has been noticed that she has had low energy and fatigue recently and increased mood lability but no physical agitation.  Patient does have Parkinson's with dementia.  It is likely she is starting to exhibit behavioral changes related to the dementia.  Her A1c remains stable at 6.7 today which is no significant change from March this year at 6.6.  She also needs a B12 injection today.    Current Medication: Outpatient Encounter Medications as of 01/14/2022  Medication Sig   Accu-Chek Softclix Lancets lancets Use as instructed twice a daily DX E11.65   aspirin 81 MG chewable tablet Chew by mouth.   carbidopa-levodopa (SINEMET IR) 25-100 MG tablet Take 1 tablet by mouth 2 (two) times daily.    Carbidopa-Levodopa ER (SINEMET CR) 25-100 MG tablet controlled release Take 1 tablet by mouth at bedtime.    Cholecalciferol (VITAMIN D3 PO) Take by mouth daily.   clorazepate (TRANXENE) 3.75 MG tablet Take 1 tablet (3.75 mg total) by mouth 2 (two) times daily as needed for anxiety.   clotrimazole-betamethasone (LOTRISONE) cream APPLY TO AFFECTED AREA TWICE A DAY   furosemide (LASIX) 20 MG tablet TAKE 1 TABLET BY MOUTH EVERY OTHER DAY   gabapentin (NEURONTIN) 100 MG capsule TAKE 1 CAPSULE BY MOUTH TWICE DAILY AS NEEDED FOR LEG AND NERVE PAIN   glucose blood (ACCU-CHEK GUIDE) test strip 1 each by Other route in the morning and at bedtime. Use as instructed twice a day Dx e11.65   metoprolol succinate (TOPROL-XL) 25 MG 24 hr tablet Take 1 tablet (25 mg total)  by mouth daily. START WITH 1/2 TABLET BY MOUTH ONCE A DAY THEN INCREASE after 7 days if BP still elevated >140/90   mirtazapine (REMERON) 15 MG tablet Take 15 mg by mouth at bedtime.   mirtazapine (REMERON) 7.5 MG tablet TAKE 1 TABLET BY MOUTH EVERYDAY AT BEDTIME   nitrofurantoin, macrocrystal-monohydrate, (MACROBID) 100 MG capsule Take 1 capsule (100 mg total) by mouth 2 (two) times daily.   oxybutynin (DITROPAN) 5 MG tablet TAKE 1 TABLET BY MOUTH EVERY DAY   pantoprazole (PROTONIX) 40 MG tablet TAKE 1 TABLET BY MOUTH EVERY DAY   polyethylene glycol (MIRALAX / GLYCOLAX) packet Take 17 g by mouth daily as needed for mild constipation.    Semaglutide (RYBELSUS) 3 MG TABS Take 3 mg by mouth daily before breakfast. Take medication on empty stomach with no more than 4 oz of water and wait 30 minutes before eating, drinking or taking any other medications.   traMADol (ULTRAM) 50 MG tablet Take 1 tablet (50 mg total) by mouth every 12 (twelve) hours as needed for moderate pain or severe pain. for pain   Facility-Administered Encounter Medications as of 01/14/2022  Medication   cyanocobalamin ((VITAMIN B-12)) injection 1,000 mcg    Surgical History: Past Surgical History:  Procedure Laterality Date   ABDOMINAL HERNIA REPAIR     ADENOIDECTOMY  1994   CATARACT EXTRACTION, BILATERAL  09/2014, 07/2014   child birth  [id number], [id number]  CHOLECYSTECTOMY  1989   laser surgery on both eyes  11/2016   left knee replacement  1993   para esophogeal hernia repair     TONSILLECTOMY  1944   VAGINAL HYSTERECTOMY  1976    Medical History: Past Medical History:  Diagnosis Date   Diet-controlled type 2 diabetes mellitus (HCC)    Hyperlipidemia    Hypertension    Osteoarthritis    Parkinson's disease (HCC)    Peptic ulcer disease     Family History: Family History  Problem Relation Age of Onset   Lung cancer Mother    Hyperlipidemia Mother    Hypertension Mother    Osteoarthritis Mother    Stroke  Mother     Social History   Socioeconomic History   Marital status: Single    Spouse name: Not on file   Number of children: Not on file   Years of education: Not on file   Highest education level: Not on file  Occupational History   Not on file  Tobacco Use   Smoking status: Never   Smokeless tobacco: Never  Substance and Sexual Activity   Alcohol use: No   Drug use: No   Sexual activity: Not on file  Other Topics Concern   Not on file  Social History Narrative   Not on file   Social Determinants of Health   Financial Resource Strain: Not on file  Food Insecurity: Not on file  Transportation Needs: Not on file  Physical Activity: Not on file  Stress: Not on file  Social Connections: Not on file  Intimate Partner Violence: Not on file      Review of Systems  Constitutional:  Positive for fatigue (low energy). Negative for chills and unexpected weight change.  HENT:  Negative for congestion, rhinorrhea, sneezing and sore throat.   Eyes:  Negative for redness.  Respiratory:  Negative for cough, chest tightness and shortness of breath.   Cardiovascular: Negative.  Negative for chest pain and palpitations.  Gastrointestinal: Negative.  Negative for abdominal pain, constipation, diarrhea, nausea and vomiting.  Genitourinary:  Negative for dysuria and frequency.  Musculoskeletal:  Positive for arthralgias and gait problem (ambulates in wheelchair with assistance, working with physical therapy.). Negative for back pain, joint swelling and neck pain.  Skin: Negative.  Negative for rash.  Neurological:  Negative for tremors and numbness.  Hematological:  Negative for adenopathy. Does not bruise/bleed easily.  Psychiatric/Behavioral:  Negative for behavioral problems (Depression), sleep disturbance and suicidal ideas. The patient is not nervous/anxious.     Vital Signs: BP (!) 157/83   Pulse (!) 49   Temp 98.3 F (36.8 C)   Resp 16   Ht 5' (1.524 m)   Wt 160 lb (72.6  kg)   SpO2 96%   BMI 31.25 kg/m    Physical Exam Vitals reviewed.  Constitutional:      General: She is not in acute distress.    Appearance: Normal appearance. She is obese. She is not ill-appearing.  HENT:     Head: Normocephalic and atraumatic.  Eyes:     Pupils: Pupils are equal, round, and reactive to light.  Cardiovascular:     Rate and Rhythm: Normal rate and regular rhythm.  Pulmonary:     Effort: Pulmonary effort is normal. No respiratory distress.  Neurological:     Mental Status: She is alert and oriented to person, place, and time.  Psychiatric:        Mood and Affect: Mood  normal. Affect is blunt.        Speech: Speech is delayed.        Behavior: Behavior is not agitated (increased irritability in evening and night, changes in mood but no physical aggression or fighting. sometimes uncooperative with physical therapy stretching.).        Cognition and Memory: Cognition is impaired. Memory is impaired.        Assessment/Plan: 1. Type 2 diabetes mellitus with hyperglycemia, without long-term current use of insulin (HCC) A1c is stable at 6.7, follow-up in 3 months to repeat A1c.  Instructed patient and caregiver that she can have a very small spoonful of applesauce to take her Rybelsus due to having difficulty swallowing it due to the bitter taste. - POCT glycosylated hemoglobin (Hb A1C) - Semaglutide (RYBELSUS) 3 MG TABS; Take 3 mg by mouth daily before breakfast. Take medication on empty stomach with no more than 4 oz of water and wait 30 minutes before eating, drinking or taking any other medications.  Dispense: 30 tablet; Refill: 3  2. B12 deficiency B12 injection administered in office today, hopefully will increase energy - cyanocobalamin ((VITAMIN B-12)) injection 1,000 mcg  3. Parkinson disease (HCC) On medication and continues to be followed by neurology  4. Primary generalized hypertrophic osteoarthrosis Stable, refills ordered - traMADol (ULTRAM) 50  MG tablet; Take 1 tablet (50 mg total) by mouth every 12 (twelve) hours as needed for moderate pain or severe pain. for pain  Dispense: 45 tablet; Refill: 2  5. Generalized anxiety disorder Refills ordered - clorazepate (TRANXENE) 3.75 MG tablet; Take 1 tablet (3.75 mg total) by mouth 2 (two) times daily as needed for anxiety.  Dispense: 60 tablet; Refill: 1   General Counseling: Hailey verbalizes understanding of the findings of todays visit and agrees with plan of treatment. I have discussed any further diagnostic evaluation that may be needed or ordered today. We also reviewed her medications today. she has been encouraged to call the office with any questions or concerns that should arise related to todays visit.    Orders Placed This Encounter  Procedures   POCT glycosylated hemoglobin (Hb A1C)    Meds ordered this encounter  Medications   cyanocobalamin ((VITAMIN B-12)) injection 1,000 mcg    Return in about 3 months (around 04/16/2022) for F/U, Recheck A1C, Marceline Napierala PCP.   Total time spent:30 Minutes Time spent includes review of chart, medications, test results, and follow up plan with the patient.   [place] Controlled Substance Database was reviewed by me.  This patient was seen by Sallyanne Kuster, FNP-C in collaboration with Dr. Beverely Risen as a part of collaborative care agreement.   Emberlin Verner R. Tedd Sias, MSN, FNP-C Internal medicine

## 2022-01-15 ENCOUNTER — Ambulatory Visit: Payer: Medicare Other | Admitting: Nurse Practitioner

## 2022-01-26 ENCOUNTER — Other Ambulatory Visit: Payer: Self-pay | Admitting: Nurse Practitioner

## 2022-01-26 DIAGNOSIS — E1165 Type 2 diabetes mellitus with hyperglycemia: Secondary | ICD-10-CM

## 2022-02-14 ENCOUNTER — Other Ambulatory Visit: Payer: Self-pay | Admitting: Nurse Practitioner

## 2022-02-25 ENCOUNTER — Other Ambulatory Visit: Payer: Self-pay | Admitting: Nurse Practitioner

## 2022-02-25 DIAGNOSIS — I10 Essential (primary) hypertension: Secondary | ICD-10-CM

## 2022-03-26 ENCOUNTER — Other Ambulatory Visit: Payer: Self-pay | Admitting: Nurse Practitioner

## 2022-03-26 DIAGNOSIS — E1165 Type 2 diabetes mellitus with hyperglycemia: Secondary | ICD-10-CM

## 2022-04-15 ENCOUNTER — Encounter: Payer: Self-pay | Admitting: Nurse Practitioner

## 2022-04-15 ENCOUNTER — Ambulatory Visit: Payer: Medicare Other | Admitting: Nurse Practitioner

## 2022-04-15 VITALS — BP 118/67 | HR 71 | Temp 97.9°F | Resp 16 | Ht 60.0 in | Wt 160.0 lb

## 2022-04-15 DIAGNOSIS — E538 Deficiency of other specified B group vitamins: Secondary | ICD-10-CM

## 2022-04-15 DIAGNOSIS — R008 Other abnormalities of heart beat: Secondary | ICD-10-CM | POA: Diagnosis not present

## 2022-04-15 DIAGNOSIS — I10 Essential (primary) hypertension: Secondary | ICD-10-CM

## 2022-04-15 DIAGNOSIS — E1165 Type 2 diabetes mellitus with hyperglycemia: Secondary | ICD-10-CM | POA: Diagnosis not present

## 2022-04-15 LAB — POCT GLYCOSYLATED HEMOGLOBIN (HGB A1C): Hemoglobin A1C: 6.7 % — AB (ref 4.0–5.6)

## 2022-04-15 MED ORDER — CYANOCOBALAMIN 1000 MCG/ML IJ SOLN
1000.0000 ug | Freq: Once | INTRAMUSCULAR | Status: AC
  Administered 2022-04-15: 1000 ug via INTRAMUSCULAR

## 2022-04-15 NOTE — Progress Notes (Signed)
[facility] [address] [place], [place] [address]  Internal MEDICINE  Office Visit Note  Patient Name: Jennifer Snyder  [id number]  [id number]  Date of Service: 04/15/2022  Chief Complaint  Patient presents with   Follow-up   Diabetes   Hyperlipidemia   Hypertension    HPI Tere presents for a follow up visit for hypertension, diabetes and hyperlipidemia. Diabetes -- A1c is 6.7 now, stable.  Hypertension -- controlled with current medications Irregular heart rate -- was as low as 41 bpm on pulse oximeter but improved up to 71 bpm. No chest pain, no difficulty breathing, no other associated symptoms.  B12 deficiency -- wants B12 injection today.     Improved up to 71, no chest pain, no diff breathing, no other sx. Will spot check o2 at home. Discused red flags to call 911 for.     Current Medication: Outpatient Encounter Medications as of 04/15/2022  Medication Sig   ACCU-CHEK GUIDE test strip 1 EACH BY OTHER ROUTE IN THE MORNING AND AT BEDTIME. USE AS INSTRUCTED TWICE A DAY DX E11.65   Accu-Chek Softclix Lancets lancets Use as instructed twice a daily DX E11.65   aspirin 81 MG chewable tablet Chew by mouth.   carbidopa-levodopa (SINEMET IR) 25-100 MG tablet Take 1 tablet by mouth 2 (two) times daily.    Carbidopa-Levodopa ER (SINEMET CR) 25-100 MG tablet controlled release Take 1 tablet by mouth at bedtime.    Cholecalciferol (VITAMIN D3 PO) Take by mouth daily.   clorazepate (TRANXENE) 3.75 MG tablet Take 1 tablet (3.75 mg total) by mouth 2 (two) times daily as needed for anxiety.   clotrimazole-betamethasone (LOTRISONE) cream APPLY TO AFFECTED AREA TWICE A DAY   furosemide (LASIX) 20 MG tablet TAKE 1 TABLET BY MOUTH EVERY OTHER DAY   gabapentin (NEURONTIN) 100 MG capsule TAKE 1 CAPSULE BY MOUTH TWICE DAILY AS NEEDED FOR LEG AND NERVE PAIN   metoprolol succinate (TOPROL-XL) 25 MG 24 hr tablet T START WITH 1/2 TABLET BY MOUTH ONCE A DAY THEN INCREASE AFTER 7  DAYS TO 1 TABLET DAILY IF BP STILL ELEVATED >140/90   mirtazapine (REMERON) 15 MG tablet Take 15 mg by mouth at bedtime.   mirtazapine (REMERON) 7.5 MG tablet TAKE 1 TABLET BY MOUTH EVERYDAY AT BEDTIME   oxybutynin (DITROPAN) 5 MG tablet TAKE 1 TABLET BY MOUTH EVERY DAY   pantoprazole (PROTONIX) 40 MG tablet TAKE 1 TABLET BY MOUTH EVERY DAY   polyethylene glycol (MIRALAX / GLYCOLAX) packet Take 17 g by mouth daily as needed for mild constipation.    Semaglutide (RYBELSUS) 3 MG TABS Take 3 mg by mouth daily before breakfast. Take medication on empty stomach with no more than 4 oz of water and wait 30 minutes before eating, drinking or taking any other medications.   traMADol (ULTRAM) 50 MG tablet Take 1 tablet (50 mg total) by mouth every 12 (twelve) hours as needed for moderate pain or severe pain. for pain   Facility-Administered Encounter Medications as of 04/15/2022  Medication   cyanocobalamin (VITAMIN B12) injection 1,000 mcg    Surgical History: Past Surgical History:  Procedure Laterality Date   ABDOMINAL HERNIA REPAIR     ADENOIDECTOMY  1994   CATARACT EXTRACTION, BILATERAL  09/2014, 07/2014   child birth  44, 58   CHOLECYSTECTOMY  1989   laser surgery on both eyes  11/2016   left knee replacement  1993   para esophogeal hernia repair     TONSILLECTOMY  1944   VAGINAL HYSTERECTOMY  1976    Medical History: Past Medical History:  Diagnosis Date   Diet-controlled type 2 diabetes mellitus (HCC)    Hyperlipidemia    Hypertension    Osteoarthritis    Parkinson's disease (HCC)    Peptic ulcer disease     Family History: Family History  Problem Relation Age of Onset   Lung cancer Mother    Hyperlipidemia Mother    Hypertension Mother    Osteoarthritis Mother    Stroke Mother     Social History   Socioeconomic History   Marital status: Single    Spouse name: Not on file   Number of children: Not on file   Years of education: Not on file   Highest education  level: Not on file  Occupational History   Not on file  Tobacco Use   Smoking status: Never   Smokeless tobacco: Never  Substance and Sexual Activity   Alcohol use: No   Drug use: No   Sexual activity: Not on file  Other Topics Concern   Not on file  Social History Narrative   Not on file   Social Determinants of Health   Financial Resource Strain: Not on file  Food Insecurity: Not on file  Transportation Needs: Not on file  Physical Activity: Not on file  Stress: Not on file  Social Connections: Not on file  Intimate Partner Violence: Not on file      Review of Systems  Constitutional:  Positive for fatigue (low energy). Negative for chills and unexpected weight change.  HENT:  Negative for congestion, rhinorrhea, sneezing and sore throat.   Eyes:  Negative for redness.  Respiratory:  Negative for cough, chest tightness and shortness of breath.   Cardiovascular: Negative.  Negative for chest pain and palpitations.  Gastrointestinal: Negative.  Negative for abdominal pain, constipation, diarrhea, nausea and vomiting.  Genitourinary:  Negative for dysuria and frequency.  Musculoskeletal:  Positive for arthralgias and gait problem (ambulates in wheelchair with assistance, working with physical therapy.). Negative for back pain, joint swelling and neck pain.  Skin: Negative.  Negative for rash.  Neurological:  Negative for tremors and numbness.  Hematological:  Negative for adenopathy. Does not bruise/bleed easily.  Psychiatric/Behavioral:  Negative for behavioral problems (Depression), sleep disturbance and suicidal ideas. The patient is not nervous/anxious.     Vital Signs: BP 118/67   Pulse 71 Comment: 41, irregular, improved to normal range  Temp 97.9 F (36.6 C)   Resp 16   Ht 5' (1.524 m)   Wt 160 lb (72.6 kg)   BMI 31.25 kg/m    Physical Exam Vitals reviewed.  Constitutional:      General: She is not in acute distress.    Appearance: Normal appearance.  She is obese. She is not ill-appearing.  HENT:     Head: Normocephalic and atraumatic.  Eyes:     Pupils: Pupils are equal, round, and reactive to light.  Cardiovascular:     Rate and Rhythm: Bradycardia present. Rhythm irregular.     Heart sounds:     Gallop present.  Pulmonary:     Effort: Pulmonary effort is normal. No respiratory distress.  Neurological:     Mental Status: She is alert and oriented to person, place, and time.  Psychiatric:        Mood and Affect: Mood normal. Affect is blunt.        Speech: Speech is delayed.  Behavior: Behavior is not agitated (increased irritability in evening and night, changes in mood but no physical aggression or fighting. sometimes uncooperative with physical therapy stretching.).        Cognition and Memory: Cognition is impaired. Memory is impaired.        Assessment/Plan: 1. Type 2 diabetes mellitus with hyperglycemia, without long-term current use of insulin (HCC) A1c 6.7, stable, continue rybelsus as prescribed. Follow up in 3 months to repeat A1c.  - POCT HgB A1C  2. Gallop rhythm Asymptomatic at present, heart rate improved to stable level at 71 bpm. Caregivers acknowledged that they will spot check heart rate with pulse oximeter at home. Also discussed red flags to look out that they would need to call 911 for.   3. Essential hypertension Stable, continue medications as prescribed.   4. B12 deficiency B12 injection administered in office today. - cyanocobalamin (VITAMIN B12) injection 1,000 mcg   General Counseling: Tru verbalizes understanding of the findings of todays visit and agrees with plan of treatment. I have discussed any further diagnostic evaluation that may be needed or ordered today. We also reviewed her medications today. she has been encouraged to call the office with any questions or concerns that should arise related to todays visit.    Orders Placed This Encounter  Procedures   POCT HgB A1C     Meds ordered this encounter  Medications   cyanocobalamin (VITAMIN B12) injection 1,000 mcg    Return in about 3 months (around 07/15/2022) for F/U, Recheck A1C, Jezabelle Chisolm PCP.   Total time spent:30 Minutes Time spent includes review of chart, medications, test results, and follow up plan with the patient.   [place] Controlled Substance Database was reviewed by me.  This patient was seen by Sallyanne Kuster, FNP-C in collaboration with Dr. Beverely Risen as a part of collaborative care agreement.   Loys Hoselton R. Tedd Sias, MSN, FNP-C Internal medicine

## 2022-05-14 ENCOUNTER — Other Ambulatory Visit: Payer: Self-pay | Admitting: Internal Medicine

## 2022-05-29 ENCOUNTER — Other Ambulatory Visit: Payer: Self-pay | Admitting: Physician Assistant

## 2022-05-29 DIAGNOSIS — R29810 Facial weakness: Secondary | ICD-10-CM

## 2022-06-10 ENCOUNTER — Encounter: Payer: Self-pay | Admitting: Nurse Practitioner

## 2022-06-17 ENCOUNTER — Ambulatory Visit
Admission: RE | Admit: 2022-06-17 | Discharge: 2022-06-17 | Disposition: A | Discharge status: Home / Self Care | Payer: Medicare Other | Source: Ambulatory Visit | Attending: Physician Assistant | Admitting: Physician Assistant

## 2022-06-17 DIAGNOSIS — R29810 Facial weakness: Secondary | ICD-10-CM | POA: Diagnosis present

## 2022-06-18 ENCOUNTER — Other Ambulatory Visit: Payer: Self-pay | Admitting: Nurse Practitioner

## 2022-06-18 DIAGNOSIS — E1165 Type 2 diabetes mellitus with hyperglycemia: Secondary | ICD-10-CM

## 2022-07-08 ENCOUNTER — Ambulatory Visit: Payer: Medicare Other | Admitting: Nurse Practitioner

## 2022-07-15 ENCOUNTER — Other Ambulatory Visit: Payer: Self-pay | Admitting: Nurse Practitioner

## 2022-07-15 ENCOUNTER — Ambulatory Visit (INDEPENDENT_AMBULATORY_CARE_PROVIDER_SITE_OTHER): Payer: Medicare Other | Admitting: Internal Medicine

## 2022-07-15 ENCOUNTER — Ambulatory Visit: Payer: Medicare Other | Admitting: Nurse Practitioner

## 2022-07-15 ENCOUNTER — Other Ambulatory Visit: Payer: Self-pay | Admitting: Internal Medicine

## 2022-07-15 ENCOUNTER — Encounter: Payer: Self-pay | Admitting: Internal Medicine

## 2022-07-15 DIAGNOSIS — E1165 Type 2 diabetes mellitus with hyperglycemia: Secondary | ICD-10-CM

## 2022-07-15 DIAGNOSIS — Z0001 Encounter for general adult medical examination with abnormal findings: Secondary | ICD-10-CM

## 2022-07-15 DIAGNOSIS — Z7409 Other reduced mobility: Secondary | ICD-10-CM

## 2022-07-15 DIAGNOSIS — F419 Anxiety disorder, unspecified: Secondary | ICD-10-CM | POA: Diagnosis not present

## 2022-07-15 DIAGNOSIS — R451 Restlessness and agitation: Secondary | ICD-10-CM

## 2022-07-15 DIAGNOSIS — R300 Dysuria: Secondary | ICD-10-CM

## 2022-07-15 DIAGNOSIS — G20A2 Parkinson's disease without dyskinesia, with fluctuations: Secondary | ICD-10-CM | POA: Diagnosis not present

## 2022-07-15 DIAGNOSIS — I10 Essential (primary) hypertension: Secondary | ICD-10-CM

## 2022-07-15 LAB — POCT GLYCOSYLATED HEMOGLOBIN (HGB A1C): Hemoglobin A1C: 7 % — AB (ref 4.0–5.6)

## 2022-07-15 MED ORDER — CLORAZEPATE DIPOTASSIUM 3.75 MG PO TABS: ORAL_TABLET | ORAL | 3 refills | Status: DC

## 2022-07-15 NOTE — Progress Notes (Signed)
[facility] [address] [place], [place] [address]  Internal MEDICINE  Office Visit Note  Patient Name: Jennifer Snyder  [id number]  [id number]  Date of Service: 07/15/2022  Chief Complaint  Patient presents with   Medicare Wellness   Diabetes   Hypertension   Hyperlipidemia   Agitation    Patient's daughter is requesting treatment for recent agitation   Quality Metric Gaps    Shingles and TDAP     HPI Pt is here for routine health maintenance examination Patient is in the wheelchair with her caregiver who has been taking care of her last 20 years, occasional complaint of agitation has been on clorazepate for a long time Daughter and son and sister having hospital bed but patient has refused states she does not want to use the hospital bed End-stage Parkinson disease however response to therapy, tremors are under control, does complain of choking at times Diabetes is well-controlled Blood pressure is under control as well Patient has declined other preventive health maintenance including mammogram and bone density  Current Medication: Outpatient Encounter Medications as of 07/15/2022  Medication Sig   ACCU-CHEK GUIDE test strip 1 EACH BY OTHER ROUTE IN THE MORNING AND AT BEDTIME. USE AS INSTRUCTED TWICE A DAY DX E11.65   Accu-Chek Softclix Lancets lancets Use as instructed twice a daily DX E11.65   aspirin 81 MG chewable tablet Chew by mouth.   carbidopa-levodopa (SINEMET IR) 25-100 MG tablet Take 1 tablet by mouth 2 (two) times daily.    Carbidopa-Levodopa ER (SINEMET CR) 25-100 MG tablet controlled release Take 1 tablet by mouth at bedtime.    Cholecalciferol (VITAMIN D3 PO) Take by mouth daily.   clorazepate (TRANXENE) 3.75 MG tablet Take one tab 2 x day and then may take one extra if needed for agitation   clotrimazole-betamethasone (LOTRISONE) cream APPLY TO AFFECTED AREA TWICE A DAY   furosemide (LASIX) 20 MG tablet TAKE 1 TABLET BY MOUTH EVERY OTHER DAY    gabapentin (NEURONTIN) 100 MG capsule TAKE 1 CAPSULE BY MOUTH TWICE DAILY AS NEEDED FOR LEG AND NERVE PAIN   metoprolol succinate (TOPROL-XL) 25 MG 24 hr tablet T START WITH 1/2 TABLET BY MOUTH ONCE A DAY THEN INCREASE AFTER 7 DAYS TO 1 TABLET DAILY IF BP STILL ELEVATED >140/90   mirtazapine (REMERON) 15 MG tablet Take 15 mg by mouth at bedtime.   oxybutynin (DITROPAN) 5 MG tablet TAKE 1 TABLET BY MOUTH EVERY DAY   pantoprazole (PROTONIX) 40 MG tablet TAKE 1 TABLET BY MOUTH EVERY DAY   polyethylene glycol (MIRALAX / GLYCOLAX) packet Take 17 g by mouth daily as needed for mild constipation.    RYBELSUS 3 MG TABS TAKE 3 MG BY MOUTH DAILY BEFORE BREAKFAST. TAKE MEDICATION ON EMPTY STOMACH WITH NO MORE THAN 4 OZ OF WATER AND WAIT 30 MINUTES BEFORE EATING, DRINKING OR TAKING ANY OTHER MEDICATIONS.   traMADol (ULTRAM) 50 MG tablet Take 1 tablet (50 mg total) by mouth every 12 (twelve) hours as needed for moderate pain or severe pain. for pain   [DISCONTINUED] clorazepate (TRANXENE) 3.75 MG tablet Take 1 tablet (3.75 mg total) by mouth 2 (two) times daily as needed for anxiety.   No facility-administered encounter medications on file as of 07/15/2022.    Surgical History: Past Surgical History:  Procedure Laterality Date   ABDOMINAL HERNIA REPAIR     ADENOIDECTOMY  1994   CATARACT EXTRACTION, BILATERAL  09/2014, 07/2014   child birth  70, 73  CHOLECYSTECTOMY  1989   laser surgery on both eyes  11/2016   left knee replacement  1993   para esophogeal hernia repair     TONSILLECTOMY  1944   VAGINAL HYSTERECTOMY  1976    Medical History: Past Medical History:  Diagnosis Date   Diet-controlled type 2 diabetes mellitus (HCC)    Hyperlipidemia    Hypertension    Osteoarthritis    Parkinson's disease    Peptic ulcer disease     Family History: Family History  Problem Relation Age of Onset   Lung cancer Mother    Hyperlipidemia Mother    Hypertension Mother    Osteoarthritis  Mother    Stroke Mother     Social History: Social History   Socioeconomic History   Marital status: Single    Spouse name: Not on file   Number of children: Not on file   Years of education: Not on file   Highest education level: Not on file  Occupational History   Not on file  Tobacco Use   Smoking status: Never   Smokeless tobacco: Never  Substance and Sexual Activity   Alcohol use: No   Drug use: No   Sexual activity: Not on file  Other Topics Concern   Not on file  Social History Narrative   Not on file   Social Determinants of Health   Financial Resource Strain: Not on file  Food Insecurity: Not on file  Transportation Needs: Not on file  Physical Activity: Not on file  Stress: Not on file  Social Connections: Not on file   Flowsheet [facility]  PHQ-2 Total Score 0       Flowsheet Row [facility]  Thoughts that you would be better off dead, or of hurting yourself in some way Not at all  PHQ-9 Total Score 5        Review of Systems  Constitutional:  Negative for chills, fatigue and unexpected weight change.  HENT:  Negative for congestion, postnasal drip, rhinorrhea, sneezing and sore throat.   Eyes:  Negative for redness.  Respiratory:  Negative for cough, chest tightness and shortness of breath.   Cardiovascular:  Negative for chest pain and palpitations.  Gastrointestinal:  Negative for abdominal pain, constipation, diarrhea, nausea and vomiting.  Genitourinary:  Negative for dysuria and frequency.  Musculoskeletal:  Positive for arthralgias and gait problem. Negative for back pain, joint swelling and neck pain.  Skin:  Negative for rash.  Neurological:  Negative for tremors and numbness.  Hematological:  Negative for adenopathy. Does not bruise/bleed easily.  Psychiatric/Behavioral:  Positive for agitation. Negative for behavioral problems  (Depression), sleep disturbance and suicidal ideas. The patient is not nervous/anxious.      Vital Signs: BP 138/68   Pulse (!) 59   Temp 98.2 F (36.8 C)   Resp 16   Ht 5' (1.524 m)   Wt 160 lb (72.6 kg)   SpO2 96%   BMI 31.25 kg/m    Physical Exam Constitutional:      General: She is not in acute distress.    Appearance: She is well-developed. She is not diaphoretic.     Comments: Wheel chair bound   HENT:     Head: Normocephalic and atraumatic.     Right Ear: External ear normal.     Left Ear: External ear normal.     Nose: Nose normal.  Mouth/Throat:     Pharynx: No oropharyngeal exudate.  Eyes:     General: No scleral icterus.       Right eye: No discharge.        Left eye: No discharge.     Conjunctiva/sclera: Conjunctivae normal.     Pupils: Pupils are equal, round, and reactive to light.  Neck:     Thyroid: No thyromegaly.     Vascular: No JVD.     Trachea: No tracheal deviation.  Cardiovascular:     Rate and Rhythm: Normal rate and regular rhythm.     Pulses:          Dorsalis pedis pulses are 3+ on the right side and 3+ on the left side.       Posterior tibial pulses are 3+ on the right side and 3+ on the left side.     Heart sounds: Normal heart sounds. No murmur heard.    No friction rub. No gallop.  Pulmonary:     Effort: Pulmonary effort is normal. No respiratory distress.     Breath sounds: Normal breath sounds. No stridor. No wheezing or rales.  Chest:     Chest wall: No tenderness.  Abdominal:     General: Bowel sounds are normal. There is no distension.     Palpations: Abdomen is soft. There is no mass.     Tenderness: There is no abdominal tenderness. There is no guarding or rebound.  Musculoskeletal:        General: No tenderness or deformity. Normal range of motion.     Cervical back: Normal range of motion and neck supple.     Right foot: Normal range of motion.     Left foot: Normal range of motion.  Feet:     Right foot:      Protective Sensation: 2 sites tested.  2 sites sensed.     Skin integrity: Skin integrity normal.     Toenail Condition: Right toenails are normal.     Left foot:     Protective Sensation: 2 sites tested.  2 sites sensed.     Skin integrity: Skin integrity normal.     Toenail Condition: Left toenails are normal.  Lymphadenopathy:     Cervical: No cervical adenopathy.  Skin:    General: Skin is warm and dry.     Coloration: Skin is not pale.     Findings: No erythema or rash.  Neurological:     Mental Status: She is alert.     Cranial Nerves: No cranial nerve deficit.     Motor: No abnormal muscle tone.     Coordination: Coordination normal.     Deep Tendon Reflexes: Reflexes are normal and symmetric.  Psychiatric:        Behavior: Behavior normal.        Thought Content: Thought content normal.        Judgment: Judgment normal.       Assessment/Plan: 1. Encounter for general adult medical examination with abnormal findings Patient declined mammogram, bone density and hospital bed at this time  2. Type 2 diabetes mellitus with hyperglycemia, without long-term current use of insulin (HCC) Controlled with medications - POCT HgB A1C  3. Parkinson's disease without dyskinesia, with fluctuating manifestations Controlled at this time we will continue her antibiotic therapy as before  4. Does not transfer from wheelchair to chair Patient is 100% wheelchair-bound  5. Anxiety with agitation We will increase her medication on a as needed  basis - clorazepate (TRANXENE) 3.75 MG tablet; Take one tab 2 x day and then may take one extra if needed for agitation  Dispense: 75 tablet; Refill: 3  6. Dysuria - UA/M w/rflx Culture, Routine   General Counseling: Sianni verbalizes understanding of the findings of todays visit and agrees with plan of treatment. I have discussed any further diagnostic evaluation that may be needed or ordered today. We also reviewed her medications today. she  has been encouraged to call the office with any questions or concerns that should arise related to todays visit.    Counseling:  [place] Controlled Substance Database was reviewed by me.  Orders Placed This Encounter  Procedures   UA/M w/rflx Culture, Routine   POCT HgB A1C    Meds ordered this encounter  Medications   clorazepate (TRANXENE) 3.75 MG tablet    Sig: Take one tab 2 x day and then may take one extra if needed for agitation    Dispense:  75 tablet    Refill:  3    Total time spent:35 Minutes  Time spent includes review of chart, medications, test results, and follow up plan with the patient.     Lyndon Code, MD  Internal Medicine

## 2022-07-15 NOTE — Addendum Note (Signed)
Addended by: Annamaria Helling on: 07/15/2022 03:16 PM   Modules accepted: Orders

## 2022-07-17 ENCOUNTER — Telehealth: Payer: Self-pay

## 2022-07-17 NOTE — Telephone Encounter (Signed)
done

## 2022-07-17 NOTE — Telephone Encounter (Signed)
Patient daughter called stating that she doesn't think the Tranxene is working as patient is now hitting and cussing at her caregivers and that she thinks she needs a hospital bed. Spoke with Dr. Welton Flakes as she stated the daughter should come to her mother's appointment to discuss this and that she can't do nothing til the patient says she wants a hospital bed. Called patient daughter back and informed her and she said she isn't allowed to come to her mothers appointment cause her mom won't let her, as family member would tell the truth about things.

## 2022-07-19 LAB — MICROALBUMIN / CREATININE URINE RATIO
Creatinine, Urine: 58.5 mg/dL
Microalb/Creat Ratio: 44 mg/g creat — ABNORMAL HIGH (ref 0–29)
Microalbumin, Urine: 25.8 ug/mL

## 2022-07-19 LAB — MICROSCOPIC EXAMINATION
Casts: NONE SEEN /lpf
RBC, Urine: NONE SEEN /hpf (ref 0–2)
WBC, UA: >30 /hpf — AB (ref 0–5)

## 2022-07-19 LAB — UA/M W/RFLX CULTURE, ROUTINE
Bilirubin, UA: NEGATIVE
Glucose, UA: NEGATIVE
Ketones, UA: NEGATIVE
Nitrite, UA: POSITIVE — AB
Protein,UA: NEGATIVE
RBC, UA: NEGATIVE
Specific Gravity, UA: 1.013 (ref 1.005–1.030)
Urobilinogen, Ur: 0.2 mg/dL (ref 0.2–1.0)
pH, UA: 6 (ref 5.0–7.5)

## 2022-07-19 LAB — URINE CULTURE, REFLEX

## 2022-07-22 ENCOUNTER — Ambulatory Visit: Payer: Medicare Other | Admitting: Nurse Practitioner

## 2022-07-24 ENCOUNTER — Telehealth: Payer: Self-pay

## 2022-07-24 ENCOUNTER — Other Ambulatory Visit: Payer: Self-pay

## 2022-07-24 MED ORDER — CIPROFLOXACIN HCL 500 MG PO TABS: 500.0000 mg | ORAL_TABLET | Freq: Two times a day (BID) | ORAL | 0 refills | Status: DC

## 2022-07-24 NOTE — Telephone Encounter (Signed)
-----   Message from Lyndon Code, MD sent at 07/24/2022 12:27 PM EST ----- Please call in cipro 500 mg po bid x 7 days for UTI if not treated earlier

## 2022-07-24 NOTE — Telephone Encounter (Signed)
Spoke with patient's caregiver that Cipro was sent to pharmacy.

## 2022-07-24 NOTE — Progress Notes (Signed)
Please call in cipro 500 mg po bid x 7 days for UTI if not treated earlier

## 2022-08-01 ENCOUNTER — Inpatient Hospital Stay
Admission: EM | Admit: 2022-08-01 | Discharge: 2022-08-02 | DRG: 057 | Disposition: A | Discharge status: Hospice | Payer: Medicare Other | Attending: Internal Medicine | Admitting: Internal Medicine

## 2022-08-01 ENCOUNTER — Emergency Department: Payer: Medicare Other

## 2022-08-01 ENCOUNTER — Other Ambulatory Visit: Payer: Self-pay

## 2022-08-01 ENCOUNTER — Encounter: Payer: Self-pay | Admitting: Radiology

## 2022-08-01 DIAGNOSIS — Z823 Family history of stroke: Secondary | ICD-10-CM | POA: Diagnosis not present

## 2022-08-01 DIAGNOSIS — Z515 Encounter for palliative care: Secondary | ICD-10-CM

## 2022-08-01 DIAGNOSIS — I4891 Unspecified atrial fibrillation: Secondary | ICD-10-CM | POA: Diagnosis present

## 2022-08-01 DIAGNOSIS — Z801 Family history of malignant neoplasm of trachea, bronchus and lung: Secondary | ICD-10-CM

## 2022-08-01 DIAGNOSIS — Z7982 Long term (current) use of aspirin: Secondary | ICD-10-CM | POA: Diagnosis not present

## 2022-08-01 DIAGNOSIS — Z885 Allergy status to narcotic agent status: Secondary | ICD-10-CM | POA: Diagnosis not present

## 2022-08-01 DIAGNOSIS — Z1152 Encounter for screening for COVID-19: Secondary | ICD-10-CM

## 2022-08-01 DIAGNOSIS — I119 Hypertensive heart disease without heart failure: Secondary | ICD-10-CM | POA: Diagnosis present

## 2022-08-01 DIAGNOSIS — Z8673 Personal history of transient ischemic attack (TIA), and cerebral infarction without residual deficits: Secondary | ICD-10-CM | POA: Diagnosis not present

## 2022-08-01 DIAGNOSIS — G9349 Other encephalopathy: Secondary | ICD-10-CM | POA: Diagnosis present

## 2022-08-01 DIAGNOSIS — Z96652 Presence of left artificial knee joint: Secondary | ICD-10-CM | POA: Diagnosis present

## 2022-08-01 DIAGNOSIS — G9340 Encephalopathy, unspecified: Secondary | ICD-10-CM | POA: Diagnosis not present

## 2022-08-01 DIAGNOSIS — Z8249 Family history of ischemic heart disease and other diseases of the circulatory system: Secondary | ICD-10-CM

## 2022-08-01 DIAGNOSIS — I493 Ventricular premature depolarization: Secondary | ICD-10-CM | POA: Diagnosis present

## 2022-08-01 DIAGNOSIS — E785 Hyperlipidemia, unspecified: Secondary | ICD-10-CM | POA: Diagnosis present

## 2022-08-01 DIAGNOSIS — R29810 Facial weakness: Secondary | ICD-10-CM | POA: Diagnosis present

## 2022-08-01 DIAGNOSIS — R627 Adult failure to thrive: Secondary | ICD-10-CM | POA: Diagnosis present

## 2022-08-01 DIAGNOSIS — M1990 Unspecified osteoarthritis, unspecified site: Secondary | ICD-10-CM | POA: Diagnosis present

## 2022-08-01 DIAGNOSIS — J9811 Atelectasis: Secondary | ICD-10-CM | POA: Diagnosis present

## 2022-08-01 DIAGNOSIS — G20A1 Parkinson's disease without dyskinesia, without mention of fluctuations: Secondary | ICD-10-CM | POA: Diagnosis present

## 2022-08-01 DIAGNOSIS — R4182 Altered mental status, unspecified: Secondary | ICD-10-CM

## 2022-08-01 DIAGNOSIS — Z66 Do not resuscitate: Secondary | ICD-10-CM | POA: Diagnosis present

## 2022-08-01 DIAGNOSIS — Z83438 Family history of other disorder of lipoprotein metabolism and other lipidemia: Secondary | ICD-10-CM | POA: Diagnosis not present

## 2022-08-01 DIAGNOSIS — G4733 Obstructive sleep apnea (adult) (pediatric): Secondary | ICD-10-CM | POA: Diagnosis present

## 2022-08-01 DIAGNOSIS — R64 Cachexia: Principal | ICD-10-CM

## 2022-08-01 DIAGNOSIS — E119 Type 2 diabetes mellitus without complications: Secondary | ICD-10-CM | POA: Diagnosis present

## 2022-08-01 DIAGNOSIS — K279 Peptic ulcer, site unspecified, unspecified as acute or chronic, without hemorrhage or perforation: Secondary | ICD-10-CM | POA: Diagnosis present

## 2022-08-01 DIAGNOSIS — K449 Diaphragmatic hernia without obstruction or gangrene: Secondary | ICD-10-CM | POA: Diagnosis present

## 2022-08-01 DIAGNOSIS — Z79899 Other long term (current) drug therapy: Secondary | ICD-10-CM

## 2022-08-01 LAB — CK: Total CK: 60 U/L (ref 38–234)

## 2022-08-01 LAB — CBC
HCT: 47.2 % — ABNORMAL HIGH (ref 36.0–46.0)
Hemoglobin: 14.3 g/dL (ref 12.0–15.0)
MCH: 26.8 pg (ref 26.0–34.0)
MCHC: 30.3 g/dL (ref 30.0–36.0)
MCV: 88.6 fL (ref 80.0–100.0)
Platelets: 232 10*3/uL (ref 150–400)
RBC: 5.33 MIL/uL — ABNORMAL HIGH (ref 3.87–5.11)
RDW: 14.4 % (ref 11.5–15.5)
WBC: 10.2 10*3/uL (ref 4.0–10.5)
nRBC: 0 % (ref 0.0–0.2)

## 2022-08-01 LAB — TROPONIN I (HIGH SENSITIVITY)
Troponin I (High Sensitivity): 31 ng/L — ABNORMAL HIGH (ref <18)
Troponin I (High Sensitivity): 37 ng/L — ABNORMAL HIGH (ref <18)

## 2022-08-01 LAB — BASIC METABOLIC PANEL
Anion gap: 14 (ref 5–15)
BUN: 26 mg/dL — ABNORMAL HIGH (ref 8–23)
CO2: 24 mmol/L (ref 22–32)
Calcium: 9.4 mg/dL (ref 8.9–10.3)
Chloride: 106 mmol/L (ref 98–111)
Creatinine, Ser: 0.98 mg/dL (ref 0.44–1.00)
GFR, Estimated: 57 mL/min — ABNORMAL LOW (ref >60)
Glucose, Bld: 110 mg/dL — ABNORMAL HIGH (ref 70–99)
Potassium: 3.5 mmol/L (ref 3.5–5.1)
Sodium: 144 mmol/L (ref 135–145)

## 2022-08-01 LAB — HEPATIC FUNCTION PANEL
ALT: 9 U/L (ref 0–44)
AST: 16 U/L (ref 15–41)
Albumin: 3.6 g/dL (ref 3.5–5.0)
Alkaline Phosphatase: 124 U/L (ref 38–126)
Bilirubin, Direct: <0.1 mg/dL (ref 0.0–0.2)
Total Bilirubin: 1 mg/dL (ref 0.3–1.2)
Total Protein: 7.2 g/dL (ref 6.5–8.1)

## 2022-08-01 LAB — RESP PANEL BY RT-PCR (RSV, FLU A&B, COVID)  RVPGX2
Influenza A by PCR: NEGATIVE
Influenza B by PCR: NEGATIVE
Resp Syncytial Virus by PCR: NEGATIVE
SARS Coronavirus 2 by RT PCR: NEGATIVE

## 2022-08-01 LAB — CBG MONITORING, ED: Glucose-Capillary: 133 mg/dL — ABNORMAL HIGH (ref 70–99)

## 2022-08-01 MED ORDER — SODIUM CHLORIDE 0.9 % IV SOLN: Freq: Once | INTRAVENOUS | Status: AC

## 2022-08-01 NOTE — ED Provider Notes (Signed)
[facility] Provider Note    Event Date/Time   First MD Initiated Contact with Patient 08/01/22 2037     (approximate)   History   Failure To Thrive   HPI  Jennifer Snyder is a 86 y.o. female with a history of Parkinson's disease which usually prevents her from walking.  She has brought in with her daughter.  Her daughter reports that patient is usually alert active awake and eating and taking her medications but she has become a little bit less so in the last several days and on Friday basically has been sleeping all day.  She has been sleeping fairly constantly since Friday.  When I speak very loudly into her ear she will wake up and talk to me.  She goes back to sleep immediately however.      Physical Exam   Triage Vital Signs: ED Triage Vitals  Enc Vitals Group     BP 08/01/22 1743 (!) 155/95     Pulse Rate 08/01/22 1743 100     Resp 08/01/22 1743 18     Temp 08/01/22 1743 98 F (36.7 C)     Temp Source 08/01/22 1743 Oral     SpO2 08/01/22 1743 92 %     Weight 08/01/22 1752 145 lb (65.8 kg)     Height --      Head Circumference --      Peak Flow --      Pain Score 08/01/22 1752 0     Pain Loc --      Pain Edu? --      Excl. in GC? --     Most recent vital signs: Vitals:   08/01/22 2306 08/01/22 2311  BP: (!) 193/88 (!) 186/67  Pulse: (!) 50   Resp: 12   Temp: 97.9 F (36.6 C)   SpO2: 96%      General: Sleeping but arousable for brief periods of time will follow commands. CV:  Good peripheral perfusion.  Regular rate and rhythm no audible murmurs Resp:  Normal effort.  Lungs are clear but breaths are very shallow Abd:  No distention.  No apparent tenderness organomegaly Extremities slight trace edema Patient will squeeze my hands with both hands but will not or cannot keep her arms elevated above the bed.  She does not even try with the legs.  ED Results / Procedures / Treatments   Labs (all labs ordered are listed, but only  abnormal results are displayed) Labs Reviewed  BASIC METABOLIC PANEL - Abnormal; Notable for the following components:      Result Value   Glucose, Bld 110 (*)    BUN 26 (*)    GFR, Estimated 57 (*)    All other components within normal limits  CBC - Abnormal; Notable for the following components:   RBC 5.33 (*)    HCT 47.2 (*)    All other components within normal limits  CBG MONITORING, ED - Abnormal; Notable for the following components:   Glucose-Capillary 133 (*)    All other components within normal limits  TROPONIN I (HIGH SENSITIVITY) - Abnormal; Notable for the following components:   Troponin I (High Sensitivity) 31 (*)    All other components within normal limits  TROPONIN I (HIGH SENSITIVITY) - Abnormal; Notable for the following components:   Troponin I (High Sensitivity) 37 (*)    All other components within normal limits  RESP PANEL BY RT-PCR (RSV, FLU A&B, COVID)  RVPGX2  CK  HEPATIC FUNCTION PANEL  URINALYSIS, ROUTINE W REFLEX MICROSCOPIC     EKG  EKG #1 read and interpreted by me shows A-fib with RVR at a rate of 120 PVCs left axis nonspecific ST-T wave changes EKG #2 read interpreted by me shows A-fib with RVR rate of 116 left axis nonspecific changes decreased R wave progression which is not new EKG #3 shows A-fib with RVR at a rate of 110 left axis PVCs decreased R wave progression nonspecific ST-T wave changes revealing no change from EKG #1 the EKG #3.   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 Chest x-ray read by radiology reviewed and interpreted by me does not show any acute disease  PROCEDURES:  Critical Care performed:   Procedures   MEDICATIONS ORDERED IN ED: Medications  0.9 %  sodium chloride  infusion (0 mLs Intravenous Stopping previously hung infusion 08/01/22 2305)     IMPRESSION / MDM / ASSESSMENT AND PLAN / ED COURSE  I reviewed the triage vital signs and the nursing notes. I consulted with teleneurology because of the possibility of a new or worsening stroke seen on CT.  They agreed with getting the MRI with this came back negative they recommend admission for toxic metabolic workup unlikely this is due to a stroke.  Patient has been not eating or drinking.  Her GFR is dropping.  They are unable to manage her at home currently.  Differential diagnosis includes, but is not limited to, toxic metabolic problem and/or worsening Parkinson's disease or dementia.  Patient's presentation is most consistent with acute presentation with potential threat to life or bodily function.  The patient is on the cardiac monitor to evaluate for evidence of arrhythmia and/or significant heart rate changes.  Patient was A-fib with RVR at a rate of about 110 now multiple PVCs      FINAL CLINICAL IMPRESSION(S) / ED DIAGNOSES   Final diagnoses:  Inanition (HCC)  Altered mental status, unspecified altered mental status type     Rx / DC Orders   ED Discharge Orders     None        Note:  This document was prepared using Dragon voice recognition software and may include unintentional dictation errors.   Arnaldo Natal, MD 08/02/22 [phone]

## 2022-08-01 NOTE — ED Notes (Signed)
First Nurse Note: Pt to ED via [facility] from home for facial droop and left sided weakness for the last 3 days. Pt fell 1 week ago and hit her head, pt was not seen at that time. Pt is wheelchair bound and has hx/o Parkinson. Pt is in NAD.

## 2022-08-01 NOTE — ED Notes (Signed)
RN observed pt HR on pulse ox to be in the low 30's however on pt 12 lead the HR is 100 as documented. Per Dr. Darnelle Catalan, EKG is picking up pt 2-1 PVC conduction.

## 2022-08-01 NOTE — ED Notes (Signed)
Verbal report received by previous RN. Pt currently in MRI.

## 2022-08-01 NOTE — ED Notes (Signed)
Stat tele neuro consult placed

## 2022-08-01 NOTE — ED Provider Triage Note (Signed)
  Emergency Medicine Provider Triage Evaluation Note  Jennifer Snyder , a 86 y.o.female,  was evaluated in triage.  Pt complains of abnormal behavior.  She is joined by her daughter, who states patient has been refusing to eat or to wake up.  She states that she is sleeping majority of the day.  She is unsure if she has had any recent falls recently.  Patient denies any pain or symptoms at this time.   Review of Systems  Positive: Fatigue, somnolence. Negative: Denies fever, chest pain, vomiting  Physical Exam   Vitals:   08/01/22 1743  BP: (!) 155/95  Pulse: 100  Resp: 18  Temp: 98 F (36.7 C)  SpO2: 92%   Gen:   Awake, no distress   Resp:  Normal effort  MSK:   Moves extremities without difficulty  Other:  Appears quite tired/somnolent.  Medical Decision Making  Given the patient's initial medical screening exam, the following diagnostic evaluation has been ordered. The patient will be placed in the appropriate treatment space, once one is available, to complete the evaluation and treatment. I have discussed the plan of care with the patient and I have advised the patient that an ED physician or mid-level practitioner will reevaluate their condition after the test results have been received, as the results may give them additional insight into the type of treatment they may need.    Diagnostics: Labs, EKG, head CT, CXR, UA  Treatments: none immediately   Varney Daily, [place] 08/01/22 1804

## 2022-08-01 NOTE — H&P (Addendum)
History and Physical    Jennifer Snyder [id number] DOB: 18-Jul-1936 DOA: 08/01/2022  PCP: Jennifer Guise, MD  Patient coming from: home I have personally briefly reviewed patient's old medical records in [place]  Chief Complaint:change in mental status  progressive since 12/22  HPI: Jennifer Snyder is a 86 y.o. female with medical history significant of  Diet controlled DMII, HLD, HTN, OSA, Parkinson's disease, PUD who is brought in by daughter due to persistent change in mental status with increase lethargy over the last few days and inability to take po including medications as well as facial droop and left sided weakness.Daughter states patient has had these episodes in the past but they were never prolonged. Of note patient also has interim history of fall with head injury  around 1 week ago. Per daughter at that time patient refused to be evaluated.  ED Course:  In ed  vitals  Afeb, bp [id number]( high [id number]( high diastolic), hr [id number] sat 92 % , rr 18  Patient is very lethargic , non verbal but does follow some commands  Patient [place] ? Progression of cerebellar cva however MRI performed noted no new findings.  Neurology Dr Ria Bush consulted who recommended admit for encephalopathy evaluation.   Labs [id number]  LAFB, poor baseline  tracing  Wbc: 10.2, Hbg 14.3, plt 232 NA 144, K 3.5, Glu 110, cr 0.98 Ce 31,37 Resp panel: neg Cxr;  iMPRESSION: 1. Moderate cardiomegaly. 2. Large sliding hiatal hernia as seen on prior CT. 3. Low lung volumes with bibasilar subsegmental atelectasis. 4. Blunting of the left costophrenic angle may all be due to a large epicardial fat pad seen on prior CT. It is difficult to exclude a mild left pleural effusion. 5. Resolution of prior interstitial pulmonary edema seen on 11/10/2019. CTH: MPRESSION: 1. Increased conspicuity of left cerebellar infarcts, which are new compared to 2021. Recommend MRI for further evaluation. 2. No evidence of  osseous traumatic injury to the cervical spine. CT cervical spine   Alignment: Grade 1 anterolisthesis of C4 on C5 and C5 on C6.   Skull base and vertebrae: No acute fracture. No primary bone lesion or focal pathologic process. There is degenerative fusion of C4 and C5 facet joints bilaterally and the C5 and C6 facet joints on the left.   Soft tissues and spinal canal: No prevertebral fluid or swelling. No visible canal hematoma.   Disc levels: There is no evidence of high-grade spinal canal stenosis.   Upper chest: Negative.   Other: Small amount of tracheal secretions are noted.     Review of Systems: As per HPI otherwise 10 point review of systems negative.   Past Medical History:  Diagnosis Date   Diet-controlled type 2 diabetes mellitus ([place])    Hyperlipidemia    Hypertension    Osteoarthritis    Parkinson's disease    Peptic ulcer disease     Past Surgical History:  Procedure Laterality Date   ABDOMINAL HERNIA REPAIR     ADENOIDECTOMY  1994   CATARACT EXTRACTION, BILATERAL  09/2014, 07/2014   child birth  4, [place] surgery on both eyes  11/2016   left knee replacement  1993   para esophogeal hernia repair     TONSILLECTOMY  1944   [place]     reports that she has never smoked. She has never used smokeless tobacco. She reports that she does not drink alcohol and does  not use drugs.  Allergies  Allergen Reactions   Codeine Other (See Comments)    crazy    Family History  Problem Relation Age of Onset   Lung cancer Mother    Hyperlipidemia Mother    Hypertension Mother    Osteoarthritis Mother    Stroke Mother     Prior to Admission medications   Medication Sig Start Date End Date Taking? Authorizing Provider  ACCU-CHEK GUIDE test strip 1 EACH BY OTHER ROUTE IN THE MORNING AND AT BEDTIME. USE AS INSTRUCTED TWICE A DAY DX E11.65 03/26/22  Yes Abernathy, Arlyss Repress, NP  Accu-Chek Softclix Lancets  lancets Use as instructed twice a daily DX E11.65 10/15/21  Yes Abernathy, Arlyss Repress, NP  aspirin 81 MG chewable tablet Chew by mouth.   Yes [provider]  carbidopa-levodopa (SINEMET IR) 25-100 MG tablet Take 1 tablet by mouth 2 (two) times daily.    Yes [provider]  Carbidopa-Levodopa ER (SINEMET CR) 25-100 MG tablet controlled release Take 1 tablet by mouth at bedtime.  06/08/17  Yes [provider]  Cholecalciferol (VITAMIN D3 PO) Take by mouth daily.   Yes [provider]  ciprofloxacin (CIPRO) 500 MG tablet Take 1 tablet (500 mg total) by mouth 2 (two) times daily. 07/24/22  Yes Lyndon Code, MD  clorazepate (TRANXENE) 3.75 MG tablet Take one tab 2 x day and then may take one extra if needed for agitation 07/15/22  Yes Lyndon Code, MD  clotrimazole-betamethasone (LOTRISONE) cream APPLY TO AFFECTED AREA TWICE A DAY 07/15/22  Yes Lyndon Code, MD  furosemide (LASIX) 20 MG tablet TAKE 1 TABLET BY MOUTH EVERY OTHER DAY 02/14/22  Yes Abernathy, Alyssa, NP  gabapentin (NEURONTIN) 100 MG capsule TAKE 1 CAPSULE BY MOUTH TWICE DAILY AS NEEDED FOR LEG AND NERVE PAIN 02/14/22  Yes Abernathy, Arlyss Repress, NP  metoprolol succinate (TOPROL-XL) 25 MG 24 hr tablet START WITH 1/2 TABLET BY MOUTH ONCE A DAY THEN INCREASE AFTER 7 DAYS TO 1 TABLET DAILY IF BP STILL ELEVATED >140/90 07/15/22  Yes Lyndon Code, MD  mirtazapine (REMERON) 15 MG tablet Take 15 mg by mouth at bedtime. 10/10/21  Yes [provider]  oxybutynin (DITROPAN) 5 MG tablet TAKE 1 TABLET BY MOUTH EVERY DAY 02/25/22  Yes Lyndon Code, MD  pantoprazole (PROTONIX) 40 MG tablet TAKE 1 TABLET BY MOUTH EVERY DAY 06/18/22  Yes Abernathy, Alyssa, NP  RYBELSUS 3 MG TABS TAKE 3 MG BY MOUTH DAILY BEFORE BREAKFAST. TAKE MEDICATION ON EMPTY STOMACH WITH NO MORE THAN 4 OZ OF WATER AND WAIT 30 MINUTES BEFORE EATING, DRINKING OR TAKING ANY OTHER MEDICATIONS. 06/18/22  Yes Abernathy, Alyssa, NP  polyethylene glycol  (MIRALAX / GLYCOLAX) packet Take 17 g by mouth daily as needed for mild constipation.     [provider]  traMADol (ULTRAM) 50 MG tablet Take 1 tablet (50 mg total) by mouth every 12 (twelve) hours as needed for moderate pain or severe pain. for pain 01/14/22   Sallyanne Kuster, NP    Physical Exam: Vitals:   08/01/22 2230 08/01/22 2238 08/01/22 2306 08/01/22 2311  BP: (!) 163/86  (!) 193/88 (!) 186/67  Pulse: 84  (!) 50   Resp: 20  12   Temp:  97.7 F (36.5 C) 97.9 F (36.6 C)   TempSrc:  Axillary Oral   SpO2: 96%  96%   Weight:        Constitutional: NAD, calm, comfortable Vitals:   08/01/22 2230 08/01/22  2238 08/01/22 2306 08/01/22 2311  BP: (!) 163/86  (!) 193/88 (!) 186/67  Pulse: 84  (!) 50   Resp: 20  12   Temp:  97.7 F (36.5 C) 97.9 F (36.6 C)   TempSrc:  Axillary Oral   SpO2: 96%  96%   Weight:       Eyes: PERRL, lids and conjunctivae normal ENMT: Mucous membranes are moist. Posterior pharynx clear of any exudate or lesions.Normal dentition.  Neck: normal, supple, no masses, no thyromegaly Respiratory: clear to auscultation bilaterally, no wheezing, no crackles. Normal respiratory effort. No accessory muscle use.  Cardiovascular: Regular rate and rhythm, no murmurs / rubs / gallops. No extremity edema. 2+ pedal pulses. No carotid bruits.  Abdomen: no tenderness, no masses palpated. No hepatosplenomegaly. Bowel sounds positive.  Musculoskeletal: no clubbing / cyanosis. No joint deformity upper and lower extremities. Good ROM, no contractures. Normal muscle tone.  Skin: no rashes, lesions, ulcers. No induration Neurologic: CN 2-12 grossly intact. Except mild facial droop on left, Sensation intact,  moving upper extremities  3-3+ /5  all extremities  Psychiatric: lethargic does open eyes to voice follows simple commands intermittently .  No agitation   Labs on Admission: I have personally reviewed following labs and imaging studies  CBC: Recent Labs   Lab 08/01/22 1800  WBC 10.2  HGB 14.3  HCT 47.2*  MCV 88.6  PLT [id number]   Basic Metabolic Panel: Recent Labs  Lab 08/01/22 1800  NA 144  K 3.5  CL 106  CO2 24  GLUCOSE 110*  BUN 26*  CREATININE 0.98  CALCIUM 9.4   GFR: Estimated Creatinine Clearance: 36.2 mL/min (by C-G formula based on SCr of 0.98 mg/dL). Liver Function Tests: No results for input(s): "AST", "ALT", "ALKPHOS", "BILITOT", "PROT", "ALBUMIN" in the last 168 hours. No results for input(s): "LIPASE", "AMYLASE" in the last 168 hours. No results for input(s): "AMMONIA" in the last 168 hours. Coagulation Profile: No results for input(s): "INR", "PROTIME" in the last 168 hours. Cardiac Enzymes: Recent Labs  Lab 08/01/22 2047  CKTOTAL 60   BNP (last 3 results) No results for input(s): "PROBNP" in the last 8760 hours. HbA1C: No results for input(s): "HGBA1C" in the last 72 hours. CBG: Recent Labs  Lab 08/01/22 2043  GLUCAP 133*   Lipid Profile: No results for input(s): "CHOL", "HDL", "LDLCALC", "TRIG", "CHOLHDL", "LDLDIRECT" in the last 72 hours. Thyroid Function Tests: No results for input(s): "TSH", "T4TOTAL", "FREET4", "T3FREE", "THYROIDAB" in the last 72 hours. Anemia Panel: No results for input(s): "VITAMINB12", "FOLATE", "FERRITIN", "TIBC", "IRON", "RETICCTPCT" in the last 72 hours. Urine analysis:    Component Value Date/Time   COLORURINE YELLOW (A) 11/09/2019 1649   APPEARANCEUR Cloudy (A) 07/15/2022 1556   LABSPEC 1.009 11/09/2019 1649   LABSPEC 1.003 08/31/2013 0829   PHURINE 7.0 11/09/2019 1649   GLUCOSEU Negative 07/15/2022 1556   GLUCOSEU Negative 08/31/2013 0829   HGBUR NEGATIVE 11/09/2019 1649   BILIRUBINUR Negative 07/15/2022 1556   BILIRUBINUR Negative 08/31/2013 0829   KETONESUR NEGATIVE 11/09/2019 1649   PROTEINUR Negative 07/15/2022 1556   PROTEINUR NEGATIVE 11/09/2019 1649   UROBILINOGEN 0.2 07/06/2018 1158   NITRITE Positive (A) 07/15/2022 1556   NITRITE NEGATIVE  11/09/2019 1649   LEUKOCYTESUR 2+ (A) 07/15/2022 1556   LEUKOCYTESUR LARGE (A) 11/09/2019 1649   LEUKOCYTESUR Trace 08/31/2013 0829    Radiological Exams on Admission: MR BRAIN WO CONTRAST  Result Date: 08/01/2022 CLINICAL DATA:  Altered mental status EXAM: MRI HEAD WITHOUT CONTRAST  TECHNIQUE: Multiplanar, multiecho pulse sequences of the brain and surrounding structures were obtained without intravenous contrast. COMPARISON:  None Available. FINDINGS: Brain: No acute infarction, hemorrhage, hydrocephalus, extra-axial collection or mass lesion. No chronic microhemorrhage. There is multifocal hyperintense T2-weighted signal within the periventricular and deep white matter. 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 Generalized volume loss. Normal midline structures. Vascular: Flow voids are maintained. Skull and upper cervical spine: Normal calvarial bone marrow signal. Upper cervical spine is unremarkable. Sinuses/Orbits: Negative Other: None IMPRESSION: 1. No acute intracranial abnormality. 2. 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 3. Findings of chronic microvascular ischemia and volume loss. Electronically Signed   By: Ulyses Jarred M.D.   On: 08/01/2022 23:25   DG Chest Port 1 View  Result Date: 08/01/2022 CLINICAL DATA:  Altered mental status today. EXAM: PORTABLE CHEST 1 VIEW COMPARISON:  AP chest 11/10/2019, chest two views 04/27/2012; CT chest 11/09/2019 FINDINGS: Cardiac silhouette is again moderately enlarged. Mediastinal contours are within normal limits. Moderate calcification again seen within the aortic arch. There is an air-fluid level overlying the heart corresponding to the large sliding hiatal hernia seen on prior CT. There are moderately decreased lung volumes with limits evaluation. There is a prominent left epicardial fat pad as better seen on prior CT. Therefore, blunting of left costophrenic angle may all be  the epicardial fat pad,. It is difficult to exclude a mild left pleural effusion. There is bibasilar horizontal linear likely subsegmental atelectasis. Resolution of the prior interstitial pulmonary edema seen on 11/10/2019 most recent radiographs. No pneumothorax. No acute skeletal abnormality. IMPRESSION: 1. Moderate cardiomegaly. 2. Large sliding hiatal hernia as seen on prior CT. 3. Low lung volumes with bibasilar subsegmental atelectasis. 4. Blunting of the left costophrenic angle may all be due to a large epicardial fat pad seen on prior CT. It is difficult to exclude a mild left pleural effusion. 5. Resolution of prior interstitial pulmonary edema seen on 11/10/2019. Electronically Signed   By: Yvonne Kendall M.D.   On: 08/01/2022 18:50   CT Head Wo Contrast  Result Date: 08/01/2022 CLINICAL DATA:  Altered mental status EXAM: CT HEAD WITHOUT CONTRAST CT CERVICAL SPINE WITHOUT CONTRAST TECHNIQUE: Multidetector CT imaging of the head and cervical spine was performed following the standard protocol without intravenous contrast. Multiplanar CT image reconstructions of the cervical spine were also generated. RADIATION DOSE REDUCTION: This exam was performed according to the departmental dose-optimization program which includes automated exposure control, adjustment of the mA and/or kV according to patient size and/or use of iterative reconstruction technique. COMPARISON:  CT Head 06/17/22 FINDINGS: CT HEAD FINDINGS Brain: There is increased conspicuity of left cerebellar infarcts, which are new compared to 2021. Unchanged chronic right occipital infarct. No hemorrhage. Hydrocephalus. Moderate chronic microvascular ischemic change. Vascular: No hyperdense vessel or unexpected calcification. Skull: Mild soft tissue thickening along the left frontal scalp. No evidence of underlying calvarial fracture. Sinuses/Orbits: Bilateral lens replacement. Paranasal sinuses and mastoid air cells are clear. Other: None CT  CERVICAL SPINE FINDINGS Alignment: Grade 1 anterolisthesis of C4 on C5 and C5 on C6. Skull base and vertebrae: No acute fracture. No primary bone lesion or focal pathologic process. There is degenerative fusion of C4 and C5 facet joints bilaterally and the C5 and C6 facet joints on the left. Soft tissues and spinal canal: No prevertebral fluid or swelling. No visible canal hematoma. Disc levels: There is no evidence of high-grade spinal canal stenosis.  Upper chest: Negative. Other: Small amount of tracheal secretions are noted. IMPRESSION: 1. Increased conspicuity of left cerebellar infarcts, which are new compared to 2021. Recommend MRI for further evaluation. 2. No evidence of osseous traumatic injury to the cervical spine. Electronically Signed   By: Marin Roberts M.D.   On: 08/01/2022 18:46   CT Cervical Spine Wo Contrast  Result Date: 08/01/2022 CLINICAL DATA:  Altered mental status EXAM: CT HEAD WITHOUT CONTRAST CT CERVICAL SPINE WITHOUT CONTRAST TECHNIQUE: Multidetector CT imaging of the head and cervical spine was performed following the standard protocol without intravenous contrast. Multiplanar CT image reconstructions of the cervical spine were also generated. RADIATION DOSE REDUCTION: This exam was performed according to the departmental dose-optimization program which includes automated exposure control, adjustment of the mA and/or kV according to patient size and/or use of iterative reconstruction technique. COMPARISON:  CT Head 06/17/22 FINDINGS: CT HEAD FINDINGS Brain: There is increased conspicuity of left cerebellar infarcts, which are new compared to 2021. Unchanged chronic right occipital infarct. No hemorrhage. Hydrocephalus. Moderate chronic microvascular ischemic change. Vascular: No hyperdense vessel or unexpected calcification. Skull: Mild soft tissue thickening along the left frontal scalp. No evidence of underlying calvarial fracture. Sinuses/Orbits: Bilateral lens replacement.  Paranasal sinuses and mastoid air cells are clear. Other: None CT CERVICAL SPINE FINDINGS Alignment: Grade 1 anterolisthesis of C4 on C5 and C5 on C6. Skull base and vertebrae: No acute fracture. No primary bone lesion or focal pathologic process. There is degenerative fusion of C4 and C5 facet joints bilaterally and the C5 and C6 facet joints on the left. Soft tissues and spinal canal: No prevertebral fluid or swelling. No visible canal hematoma. Disc levels: There is no evidence of high-grade spinal canal stenosis. Upper chest: Negative. Other: Small amount of tracheal secretions are noted. IMPRESSION: 1. Increased conspicuity of left cerebellar infarcts, which are new compared to 2021. Recommend MRI for further evaluation. 2. No evidence of osseous traumatic injury to the cervical spine. Electronically Signed   By: Marin Roberts M.D.   On: 08/01/2022 18:46    EKG: Independently reviewed. See above  Assessment/Plan  Acute encephalopathy -unclear cause , possible uncontrolled HTN , possible other metabolic cause - uds, ammonia, vbg , b12, tsh  ordered to be complete  -monitor on neuro checks  - MRI negative for new CVA -monitor on neuro checks  -hold sedating medications  Parkinson's disease w/o dyskinesia -continue sinemet  Uncontrolled  HTN -resume metoprolol  -prn medications    DMII  -diet controlled  -iss/fs   Hx  TIA -continue ASA  HLD -diet controlled    OSA -qhs O2   PUD  -ppi  DVT prophylaxis:heparin Code Status: DNR Family Communication: Alternate Contact PersonRiley,Sharon L (Daughter) [phone] (Mobile)  Disposition Plan: patient  expected to be admitted greater than 2 midnights  Consults called:  neurology  Admission status: med tele   Clance Boll MD [facility]   If 7PM-7AM, please contact night-coverage [email or web address] Password [facility]  08/01/2022, 11:47 PM

## 2022-08-01 NOTE — ED Triage Notes (Signed)
Pt daughter sts that pt is refusing to eat and or wake up. Pt is resting in chair sleeping at this time. Pt has around the clock care givers at home. Pt did take her medication for parkinson's yesterday morning.

## 2022-08-01 NOTE — Consult Note (Signed)
[facility] TeleNeurology Consult Services  Stat Consult  Patient Name:   Jennifer Snyder, Jennifer Snyder Date of Birth:   [birth year] Identification Number:   MRN - [id number] Date of Service:   08/01/2022 22:10:30  Diagnosis:       R41.82 - AMS (Altered Mental Status)  Impression 86 y/o woman with history of HTN, PD presenting with failure to thrive over the past 4-5 days. CT head revealing for worsening of a left cerebellar hypodensity that was seen previously on 06/17/22 for a complaint of facial droop. No MRI performed at the time as patient refuses a number of medication interventions. Seems unlikely worsening left cerebellar finding would be causing such a decline over the past five days. No clear evidence of infection, but has been recently treated for UTI. Agree with obtaining MRI brain wo contrast to ensure there is no acute infarct in other areas that may be causing her somnolence.   Recommendations: Our recommendations are outlined below.  Diagnostic Studies : MRI brain without contrast  Laboratory Studies : Check B12/folateTSHAmmoniablood cultures x 2  Nursing Recommendations : Delirium precautions: Blinds open during the day, closed at night, frequent reorientation, minimize nighttime interruptionsWhen possible avoid benzodiazepines, opioid pain medications, and anticholinergic medications  Consultations : Toxic metabolic work up per primary team  DVT Prophylaxis : Choice of Primary Team  Disposition : Neurology will follow   ----------------------------------------------------------------------------------------------------    Metrics: [facility] Notification Time: 08/01/2022 22:08:35 Stamp Time: 08/01/2022 22:10:30 Callback Response Time: 08/01/2022 22:14:43  Primary Provider Notified of Diagnostic Impression and Management Plan on: 08/01/2022 22:33:37   CT HEAD: As Per Radiologist CT Head Showed No Acute Hemorrhage or Acute Core Infarct Query  worsening left cerebellar hypodensity.    ----------------------------------------------------------------------------------------------------  Chief Complaint: failure to thrive  History of Present Illness: Patient is a 86 year old Female. 28 woman with history of PD presenting with failure to thrive. Has had decreased PO intake for the few days. Last known to be eating well 07/28/22. Has been very somnolent. Was previously by neurology for a left facial droop a couple of months ago, evaluated via CT and was unrevealing. Treated for UTI about 2 weeks ago with ciprofloxacin. According to daughter, she has been refusing quite a number of her medications.   Past Medical History:      Hypertension      Diabetes Mellitus  Medications:  No Anticoagulant use  Antiplatelet use: Yes ASA 81 Reviewed EMR for current medications  Allergies:  Reviewed  Social History: Unable To Obtain Due To Patient Status : Patient Cannot Speak  Family History:  There is no family history of premature cerebrovascular disease pertinent to this consultation  ROS : 14 Points Review of Systems was performed and was negative except mentioned in HPI.  Past Surgical History: There Is No Surgical History Contributory To Today's Visit   Examination: BP(177/91), Pulse(36),  Neuro Exam:  General: Opens eyes to repeated voice prompting.  Face: Symmetric:  Spoke with : Dr. Darnelle Catalan    Patient / Family was informed the Neurology Consult would occur via TeleHealth consult by way of interactive audio and video telecommunications and consented to receiving care in this manner.  Patient is being evaluated for possible acute neurologic impairment and high probability of imminent or life - threatening deterioration.I spent total of 19 minutes providing care to this patient, including time for face to face visit via telemedicine, review of medical records, imaging studies and discussion of findings with  providers, the patient and / or  family.   Dr Shari Prows   [facility] For Inpatient follow-up with [facility] physician please call [facility] [phone]. This is not an outpatient service. Post hospital discharge, please contact hospital directly.  Please do not communicate with [facility] physicians via secure chat. If you have any questions, Please contact [facility].

## 2022-08-02 DIAGNOSIS — G9340 Encephalopathy, unspecified: Secondary | ICD-10-CM | POA: Diagnosis not present

## 2022-08-02 LAB — COMPREHENSIVE METABOLIC PANEL
ALT: 8 U/L (ref 0–44)
AST: 15 U/L (ref 15–41)
Albumin: 3.3 g/dL — ABNORMAL LOW (ref 3.5–5.0)
Alkaline Phosphatase: 118 U/L (ref 38–126)
Anion gap: 11 (ref 5–15)
BUN: 26 mg/dL — ABNORMAL HIGH (ref 8–23)
CO2: 24 mmol/L (ref 22–32)
Calcium: 8.9 mg/dL (ref 8.9–10.3)
Chloride: 109 mmol/L (ref 98–111)
Creatinine, Ser: 0.78 mg/dL (ref 0.44–1.00)
GFR, Estimated: >60 mL/min (ref >60)
Glucose, Bld: 131 mg/dL — ABNORMAL HIGH (ref 70–99)
Potassium: 3.5 mmol/L (ref 3.5–5.1)
Sodium: 144 mmol/L (ref 135–145)
Total Bilirubin: 1 mg/dL (ref 0.3–1.2)
Total Protein: 6.8 g/dL (ref 6.5–8.1)

## 2022-08-02 LAB — URINALYSIS, COMPLETE (UACMP) WITH MICROSCOPIC
Bilirubin Urine: NEGATIVE
Glucose, UA: NEGATIVE mg/dL
Hgb urine dipstick: NEGATIVE
Ketones, ur: 80 mg/dL — AB
Leukocytes,Ua: NEGATIVE
Nitrite: NEGATIVE
Protein, ur: 30 mg/dL — AB
Specific Gravity, Urine: 1.02 (ref 1.005–1.030)
pH: 5 (ref 5.0–8.0)

## 2022-08-02 LAB — ETHANOL: Alcohol, Ethyl (B): <10 mg/dL (ref <10)

## 2022-08-02 LAB — BLOOD GAS, VENOUS
Acid-Base Excess: 4.1 mmol/L — ABNORMAL HIGH (ref 0.0–2.0)
Bicarbonate: 28.5 mmol/L — ABNORMAL HIGH (ref 20.0–28.0)
O2 Saturation: 85.2 %
Patient temperature: 37
pCO2, Ven: 41 mmHg — ABNORMAL LOW (ref 44–60)
pH, Ven: 7.45 — ABNORMAL HIGH (ref 7.25–7.43)
pO2, Ven: 56 mmHg — ABNORMAL HIGH (ref 32–45)

## 2022-08-02 LAB — CBC
HCT: 43.9 % (ref 36.0–46.0)
Hemoglobin: 13.5 g/dL (ref 12.0–15.0)
MCH: 27.1 pg (ref 26.0–34.0)
MCHC: 30.8 g/dL (ref 30.0–36.0)
MCV: 88.2 fL (ref 80.0–100.0)
Platelets: 208 10*3/uL (ref 150–400)
RBC: 4.98 MIL/uL (ref 3.87–5.11)
RDW: 14.4 % (ref 11.5–15.5)
WBC: 11.4 10*3/uL — ABNORMAL HIGH (ref 4.0–10.5)
nRBC: 0 % (ref 0.0–0.2)

## 2022-08-02 LAB — TSH: TSH: 1.175 u[IU]/mL (ref 0.350–4.500)

## 2022-08-02 LAB — PHOSPHORUS: Phosphorus: 2.7 mg/dL (ref 2.5–4.6)

## 2022-08-02 LAB — URINE DRUG SCREEN, QUALITATIVE (ARMC ONLY)
Amphetamines, Ur Screen: NOT DETECTED
Barbiturates, Ur Screen: NOT DETECTED
Benzodiazepine, Ur Scrn: POSITIVE — AB
Cannabinoid 50 Ng, Ur ~~LOC~~: NOT DETECTED
Cocaine Metabolite,Ur ~~LOC~~: NOT DETECTED
MDMA (Ecstasy)Ur Screen: NOT DETECTED
Methadone Scn, Ur: NOT DETECTED
Opiate, Ur Screen: NOT DETECTED
Phencyclidine (PCP) Ur S: NOT DETECTED
Tricyclic, Ur Screen: NOT DETECTED

## 2022-08-02 LAB — CBG MONITORING, ED: Glucose-Capillary: 152 mg/dL — ABNORMAL HIGH (ref 70–99)

## 2022-08-02 LAB — VITAMIN B12: Vitamin B-12: 468 pg/mL (ref 180–914)

## 2022-08-02 LAB — AMMONIA: Ammonia: 11 umol/L (ref 9–35)

## 2022-08-02 LAB — MAGNESIUM: Magnesium: 1.7 mg/dL (ref 1.7–2.4)

## 2022-08-02 MED ORDER — LORAZEPAM 1 MG PO TABS: 1.0000 mg | ORAL_TABLET | ORAL | Status: DC | PRN

## 2022-08-02 MED ORDER — HALOPERIDOL 0.5 MG PO TABS: 0.5000 mg | ORAL_TABLET | ORAL | Status: DC | PRN

## 2022-08-02 MED ORDER — HYDRALAZINE HCL 20 MG/ML IJ SOLN: 10.0000 mg | Freq: Four times a day (QID) | INTRAMUSCULAR | Status: DC | PRN

## 2022-08-02 MED ORDER — ACETAMINOPHEN 650 MG RE SUPP: 650.0000 mg | Freq: Four times a day (QID) | RECTAL | Status: DC | PRN

## 2022-08-02 MED ORDER — POLYVINYL ALCOHOL 1.4 % OP SOLN: 1.0000 [drp] | Freq: Four times a day (QID) | OPHTHALMIC | Status: DC | PRN

## 2022-08-02 MED ORDER — HALOPERIDOL LACTATE 5 MG/ML IJ SOLN: 0.5000 mg | INTRAMUSCULAR | Status: DC | PRN

## 2022-08-02 MED ORDER — GLYCOPYRROLATE 0.2 MG/ML IJ SOLN: 0.2000 mg | INTRAMUSCULAR | Status: DC | PRN

## 2022-08-02 MED ORDER — CARBIDOPA-LEVODOPA 25-100 MG PO TABS
1.0000 | ORAL_TABLET | Freq: Two times a day (BID) | ORAL | Status: DC
  Filled 2022-08-02 (×3): qty 1

## 2022-08-02 MED ORDER — MAGNESIUM OXIDE -MG SUPPLEMENT 400 (240 MG) MG PO TABS: 400.0000 mg | ORAL_TABLET | Freq: Once | ORAL | Status: DC

## 2022-08-02 MED ORDER — LORAZEPAM 2 MG/ML IJ SOLN: 1.0000 mg | INTRAMUSCULAR | Status: DC | PRN

## 2022-08-02 MED ORDER — LORAZEPAM 2 MG/ML PO CONC
1.0000 mg | ORAL | Status: DC | PRN
  Filled 2022-08-02: qty 0.5

## 2022-08-02 MED ORDER — ONDANSETRON 4 MG PO TBDP: 4.0000 mg | ORAL_TABLET | Freq: Four times a day (QID) | ORAL | Status: DC | PRN

## 2022-08-02 MED ORDER — GLYCOPYRROLATE 1 MG PO TABS: 1.0000 mg | ORAL_TABLET | ORAL | Status: DC | PRN

## 2022-08-02 MED ORDER — ACETAMINOPHEN 325 MG PO TABS: 650.0000 mg | ORAL_TABLET | Freq: Four times a day (QID) | ORAL | Status: DC | PRN

## 2022-08-02 MED ORDER — ONDANSETRON HCL 4 MG/2ML IJ SOLN: 4.0000 mg | Freq: Four times a day (QID) | INTRAMUSCULAR | Status: DC | PRN

## 2022-08-02 MED ORDER — POLYETHYLENE GLYCOL 3350 17 G PO PACK: 17.0000 g | PACK | Freq: Every day | ORAL | Status: DC | PRN

## 2022-08-02 MED ORDER — METOPROLOL SUCCINATE ER 50 MG PO TB24: 25.0000 mg | ORAL_TABLET | Freq: Every day | ORAL | Status: DC

## 2022-08-02 MED ORDER — ALBUTEROL SULFATE (2.5 MG/3ML) 0.083% IN NEBU
2.5000 mg | INHALATION_SOLUTION | Freq: Four times a day (QID) | RESPIRATORY_TRACT | Status: DC
  Administered 2022-08-02 (×2): 2.5 mg via RESPIRATORY_TRACT
  Filled 2022-08-02 (×2): qty 3

## 2022-08-02 MED ORDER — ONDANSETRON HCL 4 MG PO TABS: 4.0000 mg | ORAL_TABLET | Freq: Four times a day (QID) | ORAL | Status: DC | PRN

## 2022-08-02 MED ORDER — SODIUM CHLORIDE 0.9 % IV SOLN: 12.5000 mg | Freq: Four times a day (QID) | INTRAVENOUS | Status: DC | PRN

## 2022-08-02 MED ORDER — ASPIRIN 81 MG PO CHEW: 81.0000 mg | CHEWABLE_TABLET | Freq: Every day | ORAL | Status: DC

## 2022-08-02 MED ORDER — CARBIDOPA-LEVODOPA ER 25-100 MG PO TBCR
1.0000 | EXTENDED_RELEASE_TABLET | Freq: Every day | ORAL | Status: DC
  Filled 2022-08-02: qty 1

## 2022-08-02 MED ORDER — BIOTENE DRY MOUTH MT LIQD: 15.0000 mL | OROMUCOSAL | Status: DC | PRN

## 2022-08-02 MED ORDER — HEPARIN SODIUM (PORCINE) 5000 UNIT/ML IJ SOLN
5000.0000 [IU] | Freq: Three times a day (TID) | INTRAMUSCULAR | Status: DC
  Administered 2022-08-02: 5000 [IU] via SUBCUTANEOUS
  Filled 2022-08-02: qty 1

## 2022-08-02 MED ORDER — PANTOPRAZOLE SODIUM 40 MG PO TBEC: 40.0000 mg | DELAYED_RELEASE_TABLET | Freq: Every day | ORAL | Status: DC

## 2022-08-02 MED ORDER — METOPROLOL TARTRATE 5 MG/5ML IV SOLN
5.0000 mg | Freq: Once | INTRAVENOUS | Status: AC
  Administered 2022-08-02: 5 mg via INTRAVENOUS
  Filled 2022-08-02: qty 5

## 2022-08-02 MED ORDER — SODIUM CHLORIDE 0.9 % IV SOLN: INTRAVENOUS | Status: DC

## 2022-08-02 MED ORDER — HALOPERIDOL LACTATE 2 MG/ML PO CONC: 0.5000 mg | ORAL | Status: DC | PRN

## 2022-08-02 MED ORDER — BISACODYL 10 MG RE SUPP: 10.0000 mg | Freq: Every day | RECTAL | Status: DC | PRN

## 2022-08-02 MED ORDER — POTASSIUM CHLORIDE CRYS ER 20 MEQ PO TBCR: 40.0000 meq | EXTENDED_RELEASE_TABLET | Freq: Once | ORAL | Status: DC

## 2022-08-02 NOTE — Progress Notes (Signed)
[facility] Liaison Note  New referral for inpatient hospice received from Dr. Latina Craver.   Met with patient and her daughter, Camella Seim at the bedside.  Patient is sleeping with eyes closed and open mouth breathing.  Patient minimally responsive to verbal and tactile stimuli.    Spoke with daughter about hospice services and IPU criteria.  She request IPU assessment and would like her mom to transfer if she is appropriate.    Patient approved for routine level of care by Dr. Gildardo Cranker MD- [facility].  Bed offered and accepted by [facility]y.  Will transport today once consents have been completed.  ED RN to call report to the hospice home admission line at [phone] or [phone].  EMS to be arranged by TOC for 1900 pick up.  Thank you for allowing participation in this patients care.  Dimas Aguas, RN [facility] [phone]

## 2022-08-02 NOTE — Progress Notes (Signed)
       CROSS COVER NOTE  NAME: Jennifer Snyder MRN: [id number] DOB : 01/11/1936   HPI/Events of Note   Nurse reports BP 196/98. No prn bp meds  Assessment and  Interventions   Assessment: Heart rate 107 Plan: Metoprolol 5 mg IV now Hydralazine prn       Donnie Mesa NP [facility]

## 2022-08-02 NOTE — Discharge Summary (Signed)
Physician Discharge Summary   Patient: Jennifer Snyder MRN: [id number] DOB: 1936/06/12  Admit date:     08/01/2022  Discharge date: 08/02/22  Discharge Physician: Arnetha Courser   PCP: Lyndon Code, MD   Recommendations at discharge:  Patient is being discharged to hospice facility  Discharge Diagnoses: Principal Problem:   Encephalopathy acute   Hospital Course: Taken from H&P.  Jennifer Snyder is a 86 y.o. female with medical history significant of  Diet controlled DMII, HLD, HTN, OSA, Parkinson's disease, PUD who is brought in by daughter due to persistent change in mental status with increase lethargy over the last few days and inability to take po including medications as well as facial droop and left sided weakness.Daughter states patient has had these episodes in the past but they were never prolonged. Of note patient also has interim history of fall with head injury  around 1 week ago. Per daughter at that time patient refused to be evaluated.  ED course: Afebrile, saturating well on room air.  Appears lethargic and nonverbal and was not following any commands.  Labs mostly unremarkable, respiratory viral panel negative. Chest x-ray with moderate cardiomegaly, large sliding hiatal hernia and low lung volumes with bibasilar sub-segmental atelectasis.  Blunting of left costophrenic angle may all be due to large epicardial fat pad seen on prior CT and resolution of prior interstitial pulmonary edema. CT head with increased conspicuity of the left cerebellar infarct which is new compared to 2021. CT cervical spine with no evidence of osseous traumatic injury. Skull base and vertebra with no acute fracture no primary bone lesion or focal pathologic process.  No prevertebral fluid or swelling.  No visible canal hematoma. MRI brain with no acute intracranial abnormality.  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  Findings of chronic microvascular ischemia and  volume loss.  12/30: Vitals with elevated blood pressure at 195/81.  Ammonia normal at 11, TSH normal at 1.175.  No electrolyte abnormalities including phosphorus and magnesium.  CBC with mild leukocytosis at 11.4.  VBG with mild respiratory alkalosis with pH of 7.45 and pCO2 slightly low at 41. UA with 80 of ketones, mild protein urea and many bacteria.  Pending urine culture. UDS positive for benzodiazepines-patient is on clorazepate at home for agitation. Patient remains severely encephalopathic, just open eyes when called her name momentarily, not following any other commands. Discussed with daughter and she wants her to proceed with comfort care only.  Apparently patient has stopped eating and drinking for many days before coming to the hospital.  She was having difficulty swallowing for a while now.  Comfort measures initiated and hospice facility was consulted as family is interested going to hospice facility.  Awaiting approval.  Patient was accepted at hospice facility and there was a bed available which family accepted. Patient is being transferred to hospice facility for end-of-life care.  Assessment and Plan: * Encephalopathy acute Patient with severe encephalopathy.  All labs without any significant abnormality which can explain.  Imaging including MRI negative for any acute abnormality.  Most likely disease progression with her history of advanced Parkinson's disease. Patient has stopped eating and drinking and poorly responding for the past many days.  She has not taken her medications for about a week now. Daughter was unable to take care of her at home under current situation and she was requesting comfort care only and transition to hospice facility. -Referral was sent to hospice -Comfort measures initiated-patient currently  appears comfortable and might not require whole lot of medications.         Consultants: Hospice services Procedures performed: None Disposition:  Hospice care Diet recommendation:  Discharge Diet Orders (From admission, onward)     Start     Ordered   08/02/22 0000  Diet - low sodium heart healthy        08/02/22 1601           Regular diet DISCHARGE MEDICATION: Allergies as of 08/02/2022       Reactions   Codeine Other (See Comments)   crazy        Medication List     STOP taking these medications    aspirin 81 MG chewable tablet   ciprofloxacin 500 MG tablet Commonly known as: CIPRO   Rybelsus 3 MG Tabs Generic drug: Semaglutide   traMADol 50 MG tablet Commonly known as: ULTRAM       TAKE these medications    Accu-Chek Guide test strip Generic drug: glucose blood 1 EACH BY OTHER ROUTE IN THE MORNING AND AT BEDTIME. USE AS INSTRUCTED TWICE A DAY DX E11.65   Accu-Chek Softclix Lancets lancets Use as instructed twice a daily DX E11.65   carbidopa-levodopa 25-100 MG tablet Commonly known as: SINEMET IR Take 1 tablet by mouth 2 (two) times daily.   Carbidopa-Levodopa ER 25-100 MG tablet controlled release Commonly known as: SINEMET CR Take 1 tablet by mouth at bedtime.   clorazepate 3.75 MG tablet Commonly known as: TRANXENE Take one tab 2 x day and then may take one extra if needed for agitation   clotrimazole-betamethasone cream Commonly known as: LOTRISONE APPLY TO AFFECTED AREA TWICE A DAY   furosemide 20 MG tablet Commonly known as: LASIX TAKE 1 TABLET BY MOUTH EVERY OTHER DAY   gabapentin 100 MG capsule Commonly known as: NEURONTIN TAKE 1 CAPSULE BY MOUTH TWICE DAILY AS NEEDED FOR LEG AND NERVE PAIN   metoprolol succinate 25 MG 24 hr tablet Commonly known as: TOPROL-XL START WITH 1/2 TABLET BY MOUTH ONCE A DAY THEN INCREASE AFTER 7 DAYS TO 1 TABLET DAILY IF BP STILL ELEVATED >140/90   mirtazapine 15 MG tablet Commonly known as: REMERON Take 15 mg by mouth at bedtime.   oxybutynin 5 MG tablet Commonly known as: DITROPAN TAKE 1 TABLET BY MOUTH EVERY DAY   pantoprazole 40  MG tablet Commonly known as: PROTONIX TAKE 1 TABLET BY MOUTH EVERY DAY   polyethylene glycol 17 g packet Commonly known as: MIRALAX / GLYCOLAX Take 17 g by mouth daily as needed for mild constipation.   VITAMIN D3 PO Take by mouth daily.        Discharge Exam: Filed Weights   08/01/22 1752  Weight: 65.8 kg   General.  Chronically ill-appearing, lethargic elderly lady, in no acute distress. Pulmonary.  Lungs clear bilaterally, normal respiratory effort. CV.  Regular rate and rhythm, no JVD, rub or murmur. Abdomen.  Soft, nontender, nondistended, BS positive. CNS.  Lethargic, not following any commands Extremities.  No edema, no cyanosis, pulses intact and symmetrical.  Condition at discharge: stable  The results of significant diagnostics from this hospitalization (including imaging, microbiology, ancillary and laboratory) are listed below for reference.   Imaging Studies: MR BRAIN WO CONTRAST  Result Date: 08/01/2022 CLINICAL DATA:  Altered mental status EXAM: MRI HEAD WITHOUT CONTRAST TECHNIQUE: Multiplanar, multiecho pulse sequences of the brain and surrounding structures were obtained without intravenous contrast. COMPARISON:  None Available. FINDINGS: Brain: No acute  infarction, hemorrhage, hydrocephalus, extra-axial collection or mass lesion. No chronic microhemorrhage. There is multifocal hyperintense T2-weighted signal within the periventricular and deep white matter. 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 Generalized volume loss. Normal midline structures. Vascular: Flow voids are maintained. Skull and upper cervical spine: Normal calvarial bone marrow signal. Upper cervical spine is unremarkable. Sinuses/Orbits: Negative Other: None IMPRESSION: 1. No acute intracranial abnormality. 2. 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 3. Findings of chronic microvascular ischemia and volume loss.  Electronically Signed   By: Deatra Robinson M.D.   On: 08/01/2022 23:25   DG Chest Port 1 View  Result Date: 08/01/2022 CLINICAL DATA:  Altered mental status today. EXAM: PORTABLE CHEST 1 VIEW COMPARISON:  AP chest 11/10/2019, chest two views 04/27/2012; CT chest 11/09/2019 FINDINGS: Cardiac silhouette is again moderately enlarged. Mediastinal contours are within normal limits. Moderate calcification again seen within the aortic arch. There is an air-fluid level overlying the heart corresponding to the large sliding hiatal hernia seen on prior CT. There are moderately decreased lung volumes with limits evaluation. There is a prominent left epicardial fat pad as better seen on prior CT. Therefore, blunting of left costophrenic angle may all be the epicardial fat pad,. It is difficult to exclude a mild left pleural effusion. There is bibasilar horizontal linear likely subsegmental atelectasis. Resolution of the prior interstitial pulmonary edema seen on 11/10/2019 most recent radiographs. No pneumothorax. No acute skeletal abnormality. IMPRESSION: 1. Moderate cardiomegaly. 2. Large sliding hiatal hernia as seen on prior CT. 3. Low lung volumes with bibasilar subsegmental atelectasis. 4. Blunting of the left costophrenic angle may all be due to a large epicardial fat pad seen on prior CT. It is difficult to exclude a mild left pleural effusion. 5. Resolution of prior interstitial pulmonary edema seen on 11/10/2019. Electronically Signed   By: Neita Garnet M.D.   On: 08/01/2022 18:50   CT Head Wo Contrast  Result Date: 08/01/2022 CLINICAL DATA:  Altered mental status EXAM: CT HEAD WITHOUT CONTRAST CT CERVICAL SPINE WITHOUT CONTRAST TECHNIQUE: Multidetector CT imaging of the head and cervical spine was performed following the standard protocol without intravenous contrast. Multiplanar CT image reconstructions of the cervical spine were also generated. RADIATION DOSE REDUCTION: This exam was performed according  to the departmental dose-optimization program which includes automated exposure control, adjustment of the mA and/or kV according to patient size and/or use of iterative reconstruction technique. COMPARISON:  CT Head 06/17/22 FINDINGS: CT HEAD FINDINGS Brain: There is increased conspicuity of left cerebellar infarcts, which are new compared to 2021. Unchanged chronic right occipital infarct. No hemorrhage. Hydrocephalus. Moderate chronic microvascular ischemic change. Vascular: No hyperdense vessel or unexpected calcification. Skull: Mild soft tissue thickening along the left frontal scalp. No evidence of underlying calvarial fracture. Sinuses/Orbits: Bilateral lens replacement. Paranasal sinuses and mastoid air cells are clear. Other: None CT CERVICAL SPINE FINDINGS Alignment: Grade 1 anterolisthesis of C4 on C5 and C5 on C6. Skull base and vertebrae: No acute fracture. No primary bone lesion or focal pathologic process. There is degenerative fusion of C4 and C5 facet joints bilaterally and the C5 and C6 facet joints on the left. Soft tissues and spinal canal: No prevertebral fluid or swelling. No visible canal hematoma. Disc levels: There is no evidence of high-grade spinal canal stenosis. Upper chest: Negative. Other: Small amount of tracheal secretions are noted. IMPRESSION: 1. Increased conspicuity of left cerebellar infarcts, which are new compared to  2021. Recommend MRI for further evaluation. 2. No evidence of osseous traumatic injury to the cervical spine. Electronically Signed   By: Lorenza Cambridge M.D.   On: 08/01/2022 18:46   CT Cervical Spine Wo Contrast  Result Date: 08/01/2022 CLINICAL DATA:  Altered mental status EXAM: CT HEAD WITHOUT CONTRAST CT CERVICAL SPINE WITHOUT CONTRAST TECHNIQUE: Multidetector CT imaging of the head and cervical spine was performed following the standard protocol without intravenous contrast. Multiplanar CT image reconstructions of the cervical spine were also generated.  RADIATION DOSE REDUCTION: This exam was performed according to the departmental dose-optimization program which includes automated exposure control, adjustment of the mA and/or kV according to patient size and/or use of iterative reconstruction technique. COMPARISON:  CT Head 06/17/22 FINDINGS: CT HEAD FINDINGS Brain: There is increased conspicuity of left cerebellar infarcts, which are new compared to 2021. Unchanged chronic right occipital infarct. No hemorrhage. Hydrocephalus. Moderate chronic microvascular ischemic change. Vascular: No hyperdense vessel or unexpected calcification. Skull: Mild soft tissue thickening along the left frontal scalp. No evidence of underlying calvarial fracture. Sinuses/Orbits: Bilateral lens replacement. Paranasal sinuses and mastoid air cells are clear. Other: None CT CERVICAL SPINE FINDINGS Alignment: Grade 1 anterolisthesis of C4 on C5 and C5 on C6. Skull base and vertebrae: No acute fracture. No primary bone lesion or focal pathologic process. There is degenerative fusion of C4 and C5 facet joints bilaterally and the C5 and C6 facet joints on the left. Soft tissues and spinal canal: No prevertebral fluid or swelling. No visible canal hematoma. Disc levels: There is no evidence of high-grade spinal canal stenosis. Upper chest: Negative. Other: Small amount of tracheal secretions are noted. IMPRESSION: 1. Increased conspicuity of left cerebellar infarcts, which are new compared to 2021. Recommend MRI for further evaluation. 2. No evidence of osseous traumatic injury to the cervical spine. Electronically Signed   By: Lorenza Cambridge M.D.   On: 08/01/2022 18:46    Microbiology: Results for orders placed or performed during the hospital encounter of 08/01/22  Resp panel by RT-PCR (RSV, Flu A&B, Covid) Anterior Nasal Swab     Status: None   Collection Time: 08/01/22  8:57 PM   Specimen: Anterior Nasal Swab  Result Value Ref Range Status   SARS Coronavirus 2 by RT PCR NEGATIVE  NEGATIVE Final    Comment: (NOTE) SARS-CoV-2 target nucleic acids are NOT DETECTED.  The SARS-CoV-2 RNA is generally detectable in upper respiratory specimens during the acute phase of infection. The lowest concentration of SARS-CoV-2 viral copies this assay can detect is 138 copies/mL. A negative result does not preclude SARS-Cov-2 infection and should not be used as the sole basis for treatment or other patient management decisions. A negative result may occur with  improper specimen collection/handling, submission of specimen other than nasopharyngeal swab, presence of viral mutation(s) within the areas targeted by this assay, and inadequate number of viral copies(<138 copies/mL). A negative result must be combined with clinical observations, patient history, and epidemiological information. The expected result is Negative.  Fact Sheet for Patients:  BloggerCourse.com  Fact Sheet for Healthcare Providers:  SeriousBroker.it  This test is no t yet approved or cleared by the [place] FDA and  has been authorized for detection and/or diagnosis of SARS-CoV-2 by FDA under an Emergency Use Authorization (EUA). This EUA will remain  in effect (meaning this test can be used) for the duration of the COVID-19 declaration under Section 564(b)(1) of the Act, 21 U.S.C.section 360bbb-3(b)(1), unless the authorization is terminated  or revoked sooner.       Influenza A by PCR NEGATIVE NEGATIVE Final   Influenza B by PCR NEGATIVE NEGATIVE Final    Comment: (NOTE) The Xpert Xpress SARS-CoV-2/FLU/RSV plus assay is intended as an aid in the diagnosis of influenza from Nasopharyngeal swab specimens and should not be used as a sole basis for treatment. Nasal washings and aspirates are unacceptable for Xpert Xpress SARS-CoV-2/FLU/RSV testing.  Fact Sheet for Patients: BloggerCourse.comhttps://www.fda.gov/media/152166/download  Fact Sheet for Healthcare  Providers: SeriousBroker.ithttps://www.fda.gov/media/152162/download  This test is not yet approved or cleared by the [place] FDA and has been authorized for detection and/or diagnosis of SARS-CoV-2 by FDA under an Emergency Use Authorization (EUA). This EUA will remain in effect (meaning this test can be used) for the duration of the COVID-19 declaration under Section 564(b)(1) of the Act, 21 U.S.C. section 360bbb-3(b)(1), unless the authorization is terminated or revoked.     Resp Syncytial Virus by PCR NEGATIVE NEGATIVE Final    Comment: (NOTE) Fact Sheet for Patients: BloggerCourse.comhttps://www.fda.gov/media/152166/download  Fact Sheet for Healthcare Providers: SeriousBroker.ithttps://www.fda.gov/media/152162/download  This test is not yet approved or cleared by the [place] FDA and has been authorized for detection and/or diagnosis of SARS-CoV-2 by FDA under an Emergency Use Authorization (EUA). This EUA will remain in effect (meaning this test can be used) for the duration of the COVID-19 declaration under Section 564(b)(1) of the Act, 21 U.S.C. section 360bbb-3(b)(1), unless the authorization is terminated or revoked.  Performed at [facility], [address] [place], [place] [id number]     Labs: CBC: Recent Labs  Lab 08/01/22 1800 08/02/22 0318  WBC 10.2 11.4*  HGB 14.3 13.5  HCT 47.2* 43.9  MCV 88.6 88.2  PLT 232 208   Basic Metabolic Panel: Recent Labs  Lab 08/01/22 1800 08/02/22 0318  NA 144 144  K 3.5 3.5  CL 106 109  CO2 24 24  GLUCOSE 110* 131*  BUN 26* 26*  CREATININE 0.98 0.78  CALCIUM 9.4 8.9  MG  --  1.7  PHOS  --  2.7   Liver Function Tests: Recent Labs  Lab 08/01/22 2044 08/02/22 0318  AST 16 15  ALT 9 8  ALKPHOS 124 118  BILITOT 1.0 1.0  PROT 7.2 6.8  ALBUMIN 3.6 3.3*   CBG: Recent Labs  Lab 08/01/22 2043 08/02/22 0900  GLUCAP 133* 152*    Discharge time spent: greater than 30 minutes.  This record has been created using Software engineerDragon voice  recognition software. Errors have been sought and corrected,but may not always be located. Such creation errors do not reflect on the standard of care.   Signed: Arnetha CourserSumayya Brookes Craine, MD [facility] 08/02/2022

## 2022-08-02 NOTE — ED Notes (Signed)
Pt reconnected to VS monitor when returned from MRI. Pt non-cooperative with neuro assessment. See flowsheet for documentation. Pt's daughter remains at bedside. Purwick in place. Pt's call bell within reach. Pt awaiting admitting MD.

## 2022-08-02 NOTE — ED Notes (Signed)
Pt's daughter leaving for the night. [place] sitter alarm on. Pt sleeping, rr unlabored.

## 2022-08-02 NOTE — ED Notes (Signed)
Labs recollected and sent to the lab at this time

## 2022-08-02 NOTE — ED Notes (Signed)
Upon assessing purwick placement, pt found to have urinated bed and had a large soft BM. Pt cleaned and pericare provided with assisstance from [facility]. Hospital gown, new bed sheets, and warm blankets provided. Pt remains on VS monitor. HOB 30 degrees.

## 2022-08-02 NOTE — ED Notes (Signed)
Pt refusing to take oral medications. Pt is altered. Pt is pulling at chords/removing equipment.

## 2022-08-02 NOTE — Assessment & Plan Note (Signed)
Patient with severe encephalopathy.  All labs without any significant abnormality which can explain.  Imaging including MRI negative for any acute abnormality.  Most likely disease progression with her history of advanced Parkinson's disease. Patient has stopped eating and drinking and poorly responding for the past many days.  She has not taken her medications for about a week now. Daughter was unable to take care of her at home under current situation and she was requesting comfort care only and transition to hospice facility. -Referral was sent to hospice -Comfort measures initiated-patient currently appears comfortable and might not require whole lot of medications.

## 2022-08-02 NOTE — Progress Notes (Signed)
Progress Note   Patient: GHAZAL PEVEY ZOX:[id number] DOB: 19-Jul-1936 DOA: 08/01/2022     1 DOS: the patient was seen and examined on 08/02/2022   Brief hospital course: Taken from H&P.  ROSHNI BURBANO is a 86 y.o. female with medical history significant of  Diet controlled DMII, HLD, HTN, OSA, Parkinson's disease, PUD who is brought in by daughter due to persistent change in mental status with increase lethargy over the last few days and inability to take po including medications as well as facial droop and left sided weakness.Daughter states patient has had these episodes in the past but they were never prolonged. Of note patient also has interim history of fall with head injury  around 1 week ago. Per daughter at that time patient refused to be evaluated.  ED course: Afebrile, saturating well on room air.  Appears lethargic and nonverbal and was not following any commands.  Labs mostly unremarkable, respiratory viral panel negative. Chest x-ray with moderate cardiomegaly, large sliding hiatal hernia and low lung volumes with bibasilar sub-segmental atelectasis.  Blunting of left costophrenic angle may all be due to large epicardial fat pad seen on prior CT and resolution of prior interstitial pulmonary edema. CT head with increased conspicuity of the left cerebellar infarct which is new compared to 2021. CT cervical spine with no evidence of osseous traumatic injury. Skull base and vertebra with no acute fracture no primary bone lesion or focal pathologic process.  No prevertebral fluid or swelling.  No visible canal hematoma. MRI brain with no acute intracranial abnormality.  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  Findings of chronic microvascular ischemia and volume loss.  12/30: Vitals with elevated blood pressure at 195/81.  Ammonia normal at 11, TSH normal at 1.175.  No electrolyte abnormalities including phosphorus and magnesium.  CBC with mild  leukocytosis at 11.4.  VBG with mild respiratory alkalosis with pH of 7.45 and pCO2 slightly low at 41. UA with 80 of ketones, mild protein urea and many bacteria.  Pending urine culture. UDS positive for benzodiazepines-patient is on clorazepate at home for agitation. Patient remains severely encephalopathic, just open eyes when called her name momentarily, not following any other commands. Discussed with daughter and she wants her to proceed with comfort care only.  Apparently patient has stopped eating and drinking for many days before coming to the hospital.  She was having difficulty swallowing for a while now.  Comfort measures initiated and hospice facility was consulted as family is interested going to hospice facility.  Awaiting approval.  Assessment and Plan: * Encephalopathy acute Patient with severe encephalopathy.  All labs without any significant abnormality which can explain.  Imaging including MRI negative for any acute abnormality.  Most likely disease progression with her history of advanced Parkinson's disease. Patient has stopped eating and drinking and poorly responding for the past many days.  She has not taken her medications for about a week now. Daughter was unable to take care of her at home under current situation and she was requesting comfort care only and transition to hospice facility. -Referral was sent to hospice -Comfort measures initiated-patient currently appears comfortable and might not require whole lot of medications.        Subjective: Patient was seen and examined today.  Appears very lethargic, opens eyes momentarily when called her name but not following any commands.  Physical Exam: Vitals:   08/02/22 0545 08/02/22 0700 08/02/22 0832 08/02/22 1300  BP:   Marland Kitchen)  167/100 (!) 162/92  Pulse: 88 91  92  Resp: 13 15 15  (!) 22  Temp:    98.2 F (36.8 C)  TempSrc:    Oral  SpO2: 95% 95%  94%  Weight:       General.  Chronically ill-appearing,  lethargic elderly lady, in no acute distress. Pulmonary.  Lungs clear bilaterally, normal respiratory effort. CV.  Regular rate and rhythm, no JVD, rub or murmur. Abdomen.  Soft, nontender, nondistended, BS positive. CNS.  Lethargic, not following any commands Extremities.  No edema, no cyanosis, pulses intact and symmetrical.  Data Reviewed: Prior data reviewed  Family Communication: Discussed with daughter at bedside  Disposition: Status is: Inpatient Remains inpatient appropriate because: Being transitioned to comfort care.  Planned Discharge Destination:  Hospice facility  Time spent: 50 minutes  This record has been created using . Errors have been sought and corrected,but may not always be located. Such creation errors do not reflect on the standard of care.   Author: Conservation officer, historic buildings, MD 08/02/2022 2:23 PM  For on call review [email or web address].

## 2022-08-02 NOTE — ED Notes (Signed)
Informed admitting hospitalist pt continues to have frequent PVCs. Previous RN informed EDP. PVCs ongoing. HR and PR validated for reviewed by MD. Pending response.

## 2022-08-02 NOTE — TOC Initial Note (Signed)
Transition of Care [facility]) - Initial/Assessment Note    Patient Details  Name: Jennifer Snyder MRN: [id number] Date of Birth: [birth year]  [facility] CM/SW Contact:    Carmina Miller, LCSWA Phone Number: 08/02/2022, 1:09 PM  Clinical Narrative:                  CSW received consult from MD, states pt's family is interested in residential hospice services. CSW spoke with pt's daughter Jasmine December via phone, she confirmed she is looking for services in the [place] area. CSW sent secure chat to [facility] liaison to request pt be reviewed.         Patient Goals and CMS Choice            Expected Discharge Plan and Services                                              Prior Living Arrangements/Services                       Activities of Daily Living      Permission Sought/Granted                  Emotional Assessment              Admission diagnosis:  Encephalopathy acute [G93.40] Patient Active Problem List   Diagnosis Date Noted   Encephalopathy acute 08/01/2022   Osteoporosis, post-menopausal 07/19/2021   Abnormal gait 06/20/2020   Generalized anxiety disorder 06/20/2020   Chronic diastolic CHF (congestive heart failure) (HCC) 11/14/2019   Pressure injury of skin 11/11/2019   Goals of care, counseling/discussion    Palliative care by specialist    UTI (urinary tract infection) 11/10/2019   GERD (gastroesophageal reflux disease) 11/10/2019   Generalized weakness 11/10/2019   Acute respiratory failure with hypoxia (HCC) 11/10/2019   Hypertensive urgency 11/10/2019   Acute CHF (congestive heart failure) (HCC) 11/10/2019   Primary generalized hypertrophic osteoarthrosis 07/01/2019   Encounter for general adult medical examination with abnormal findings 04/04/2018   Urinary tract infection without hematuria 04/04/2018   Chronic otitis externa of both ears 04/04/2018   Dysuria 04/04/2018   Muscle spasticity 12/28/2017    B12 deficiency 12/28/2017   Parkinson disease 12/28/2017   Essential hypertension 12/28/2017   H/O TIA (transient ischemic attack) and stroke 07/07/2017   PCP:  Lyndon Code, MD Pharmacy:   [id number] - [place], [place] - [address] [place] [address] Phone: [phone] Fax: [phone]     Social Determinants of Health (SDOH) Social History: SDOH Screenings   Alcohol Screen: Low Risk  (04/15/2022)  Depression (PHQ2-9): Low Risk  (07/15/2022)  Tobacco Use: Low Risk  (08/01/2022)   SDOH Interventions:     Readmission Risk Interventions     No data to display

## 2022-08-02 NOTE — Hospital Course (Addendum)
Taken from H&P.  Jennifer Snyder is a 86 y.o. female with medical history significant of  Diet controlled DMII, HLD, HTN, OSA, Parkinson's disease, PUD who is brought in by daughter due to persistent change in mental status with increase lethargy over the last few days and inability to take po including medications as well as facial droop and left sided weakness.Daughter states patient has had these episodes in the past but they were never prolonged. Of note patient also has interim history of fall with head injury  around 1 week ago. Per daughter at that time patient refused to be evaluated.  ED course: Afebrile, saturating well on room air.  Appears lethargic and nonverbal and was not following any commands.  Labs mostly unremarkable, respiratory viral panel negative. Chest x-ray with moderate cardiomegaly, large sliding hiatal hernia and low lung volumes with bibasilar sub-segmental atelectasis.  Blunting of left costophrenic angle may all be due to large epicardial fat pad seen on prior CT and resolution of prior interstitial pulmonary edema. CT head with increased conspicuity of the left cerebellar infarct which is new compared to 2021. CT cervical spine with no evidence of osseous traumatic injury. Skull base and vertebra with no acute fracture no primary bone lesion or focal pathologic process.  No prevertebral fluid or swelling.  No visible canal hematoma. MRI brain with no acute intracranial abnormality.  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  Findings of chronic microvascular ischemia and volume loss.  12/30: Vitals with elevated blood pressure at 195/81.  Ammonia normal at 11, TSH normal at 1.175.  No electrolyte abnormalities including phosphorus and magnesium.  CBC with mild leukocytosis at 11.4.  VBG with mild respiratory alkalosis with pH of 7.45 and pCO2 slightly low at 41. UA with 80 of ketones, mild protein urea and many bacteria.  Pending urine  culture. UDS positive for benzodiazepines-patient is on clorazepate at home for agitation. Patient remains severely encephalopathic, just open eyes when called her name momentarily, not following any other commands. Discussed with daughter and she wants her to proceed with comfort care only.  Apparently patient has stopped eating and drinking for many days before coming to the hospital.  She was having difficulty swallowing for a while now.  Comfort measures initiated and hospice facility was consulted as family is interested going to hospice facility.  Awaiting approval.  Patient was accepted at hospice facility and there was a bed available which family accepted. Patient is being transferred to hospice facility for end-of-life care.

## 2022-08-03 LAB — URINE CULTURE: Culture: NO GROWTH

## 2022-09-04 DEATH — deceased

## 2023-01-13 ENCOUNTER — Ambulatory Visit: Payer: Medicare Other | Admitting: Nurse Practitioner
